# Patient Record
Sex: Female | Born: 1945 | Race: White | Hispanic: No | Marital: Married | Smoking: Former smoker
Health system: Southern US, Community
[De-identification: ages and names within clinical notes are randomized; demographics above are authoritative.]

## PROBLEM LIST (undated history)

## (undated) DIAGNOSIS — Z8719 Personal history of other diseases of the digestive system: Secondary | ICD-10-CM

## (undated) DIAGNOSIS — J189 Pneumonia, unspecified organism: Secondary | ICD-10-CM

## (undated) DIAGNOSIS — K219 Gastro-esophageal reflux disease without esophagitis: Secondary | ICD-10-CM

## (undated) DIAGNOSIS — Z9889 Other specified postprocedural states: Secondary | ICD-10-CM

## (undated) DIAGNOSIS — J439 Emphysema, unspecified: Secondary | ICD-10-CM

## (undated) DIAGNOSIS — K224 Dyskinesia of esophagus: Secondary | ICD-10-CM

## (undated) DIAGNOSIS — M1990 Unspecified osteoarthritis, unspecified site: Secondary | ICD-10-CM

## (undated) DIAGNOSIS — C4491 Basal cell carcinoma of skin, unspecified: Secondary | ICD-10-CM

## (undated) DIAGNOSIS — R112 Nausea with vomiting, unspecified: Secondary | ICD-10-CM

## (undated) HISTORY — PX: COLONOSCOPY: SHX174

---
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
## 1990-06-03 HISTORY — PX: TUBAL LIGATION: SHX77

## 1990-06-03 HISTORY — PX: LAPAROSCOPY: SHX197

## 2004-09-06 ENCOUNTER — Ambulatory Visit: Payer: Self-pay

## 2005-04-11 ENCOUNTER — Emergency Department: Payer: Self-pay | Admitting: Internal Medicine

## 2005-04-24 ENCOUNTER — Ambulatory Visit: Payer: Self-pay | Admitting: Gastroenterology

## 2005-05-17 ENCOUNTER — Ambulatory Visit: Payer: Self-pay | Admitting: Gastroenterology

## 2005-08-06 ENCOUNTER — Ambulatory Visit: Payer: Self-pay | Admitting: Unknown Physician Specialty

## 2007-06-04 HISTORY — PX: FRACTURE SURGERY: SHX138

## 2007-06-04 HISTORY — PX: SHOULDER OPEN ROTATOR CUFF REPAIR: SHX2407

## 2010-12-27 ENCOUNTER — Encounter (HOSPITAL_COMMUNITY): Payer: Self-pay | Admitting: *Deleted

## 2011-01-11 ENCOUNTER — Other Ambulatory Visit: Payer: Self-pay | Admitting: Obstetrics & Gynecology

## 2011-01-11 ENCOUNTER — Ambulatory Visit (HOSPITAL_COMMUNITY)
Admission: RE | Admit: 2011-01-11 | Discharge: 2011-01-11 | Disposition: A | Discharge status: Home / Self Care | Payer: BC Managed Care – PPO | Source: Ambulatory Visit | Attending: Obstetrics & Gynecology | Admitting: Obstetrics & Gynecology

## 2011-01-11 ENCOUNTER — Ambulatory Visit (HOSPITAL_COMMUNITY): Payer: BC Managed Care – PPO | Admitting: Anesthesiology

## 2011-01-11 ENCOUNTER — Encounter (HOSPITAL_COMMUNITY): Payer: Self-pay | Admitting: Anesthesiology

## 2011-01-11 ENCOUNTER — Encounter (HOSPITAL_COMMUNITY)
Admission: RE | Disposition: A | Discharge status: Home / Self Care | Payer: Self-pay | Source: Ambulatory Visit | Attending: Obstetrics & Gynecology

## 2011-01-11 DIAGNOSIS — N840 Polyp of corpus uteri: Secondary | ICD-10-CM | POA: Diagnosis present

## 2011-01-11 DIAGNOSIS — N950 Postmenopausal bleeding: Secondary | ICD-10-CM | POA: Insufficient documentation

## 2011-01-11 HISTORY — DX: Gastro-esophageal reflux disease without esophagitis: K21.9

## 2011-01-11 HISTORY — DX: Unspecified osteoarthritis, unspecified site: M19.90

## 2011-01-11 LAB — CBC
HCT: 43.6 % (ref 36.0–46.0)
Hemoglobin: 14.8 g/dL (ref 12.0–15.0)
MCH: 29.8 pg (ref 26.0–34.0)
MCHC: 33.9 g/dL (ref 30.0–36.0)
MCV: 87.9 fL (ref 78.0–100.0)
Platelets: 197 10*3/uL (ref 150–400)
RBC: 4.96 MIL/uL (ref 3.87–5.11)
RDW: 13.2 % (ref 11.5–15.5)
WBC: 7.2 10*3/uL (ref 4.0–10.5)

## 2011-01-11 SURGERY — DILATATION & CURETTAGE/HYSTEROSCOPY WITH RESECTOCOPE
Anesthesia: Choice

## 2011-01-11 MED ORDER — PROPOFOL 10 MG/ML IV EMUL
INTRAVENOUS | Status: DC | PRN
  Administered 2011-01-11: 50 mg via INTRAVENOUS
  Administered 2011-01-11: 150 mg via INTRAVENOUS

## 2011-01-11 MED ORDER — LIDOCAINE HCL 1 % IJ SOLN
INTRAMUSCULAR | Status: DC | PRN
  Administered 2011-01-11: 10 mL via INTRADERMAL

## 2011-01-11 MED ORDER — LIDOCAINE HCL (CARDIAC) 20 MG/ML IV SOLN
INTRAVENOUS | Status: DC | PRN
  Administered 2011-01-11: 80 mg via INTRAVENOUS

## 2011-01-11 MED ORDER — ONDANSETRON HCL 4 MG/2ML IJ SOLN
INTRAMUSCULAR | Status: DC | PRN
  Administered 2011-01-11: 4 mg via INTRAVENOUS

## 2011-01-11 MED ORDER — KETOROLAC TROMETHAMINE 30 MG/ML IJ SOLN
INTRAMUSCULAR | Status: DC | PRN
  Administered 2011-01-11: 30 mg via INTRAVENOUS

## 2011-01-11 MED ORDER — LACTATED RINGERS IV SOLN
INTRAVENOUS | Status: DC | PRN
  Administered 2011-01-11 (×2): via INTRAVENOUS

## 2011-01-11 MED ORDER — FENTANYL CITRATE 0.05 MG/ML IJ SOLN
INTRAMUSCULAR | Status: AC
  Administered 2011-01-11: 25 ug via INTRAVENOUS
  Filled 2011-01-11: qty 2

## 2011-01-11 MED ORDER — MIDAZOLAM HCL 5 MG/5ML IJ SOLN
INTRAMUSCULAR | Status: DC | PRN
  Administered 2011-01-11: 2 mg via INTRAVENOUS

## 2011-01-11 MED ORDER — GLYCINE 1.5 % IR SOLN
Status: DC | PRN
  Administered 2011-01-11: 3000 mL

## 2011-01-11 MED ORDER — FENTANYL CITRATE 0.05 MG/ML IJ SOLN
25.0000 ug | INTRAMUSCULAR | Status: DC | PRN
  Administered 2011-01-11: 25 ug via INTRAVENOUS

## 2011-01-11 MED ORDER — FENTANYL CITRATE 0.05 MG/ML IJ SOLN
INTRAMUSCULAR | Status: DC | PRN
  Administered 2011-01-11: 50 ug via INTRAVENOUS
  Administered 2011-01-11: 100 ug via INTRAVENOUS

## 2011-01-11 SURGICAL SUPPLY — 19 items
CANISTER SUCTION 2500CC (MISCELLANEOUS) ×2 IMPLANT
CATH ROBINSON RED A/P 16FR (CATHETERS) ×2 IMPLANT
CLOTH BEACON ORANGE TIMEOUT ST (SAFETY) ×2 IMPLANT
CONTAINER PREFILL 10% NBF 60ML (FORM) ×4 IMPLANT
DRAPE UTILITY XL STRL (DRAPES) ×4 IMPLANT
ELECT REM PT RETURN 9FT ADLT (ELECTROSURGICAL) ×2
ELECTRODE REM PT RTRN 9FT ADLT (ELECTROSURGICAL) ×1 IMPLANT
ELECTRODE ROLLER BARREL 22FR (ELECTROSURGICAL) IMPLANT
ELECTRODE ROLLER VERSAPOINT (ELECTRODE) IMPLANT
ELECTRODE RT ANGLE VERSAPOINT (CUTTING LOOP) IMPLANT
ELECTRODE VAPORCUT 22FR (ELECTROSURGICAL) IMPLANT
GLOVE BIO SURGEON STRL SZ7 (GLOVE) ×4 IMPLANT
GLOVE BIOGEL PI IND STRL 7.0 (GLOVE) ×1 IMPLANT
GLOVE BIOGEL PI INDICATOR 7.0 (GLOVE) ×1
GOWN PREVENTION PLUS LG XLONG (DISPOSABLE) ×4 IMPLANT
LOOP ANGLED CUTTING 22FR (CUTTING LOOP) ×2 IMPLANT
PACK HYSTEROSCOPY LF (CUSTOM PROCEDURE TRAY) ×2 IMPLANT
TOWEL OR 17X24 6PK STRL BLUE (TOWEL DISPOSABLE) ×4 IMPLANT
WATER STERILE IRR 1000ML POUR (IV SOLUTION) ×2 IMPLANT

## 2011-01-11 NOTE — Transfer of Care (Signed)
Immediate Anesthesia Transfer of Care Note  Patient: Beverly [name]  Procedure(s) Performed:  DILATATION & CURETTAGE/HYSTEROSCOPY WITH RESECTOSCOPE  Patient Location: PACU  Anesthesia Type: General  Level of Consciousness: awake, alert  and sedated  Airway & Oxygen Therapy: Patient Spontanous Breathing and Patient connected to nasal cannula oxygen  Post-op Assessment: Report given to PACU RN  Post vital signs: Reviewed and stable  Complications: No apparent anesthesia complications

## 2011-01-11 NOTE — Op Note (Signed)
Preoperative diagnosis: Endometrial polyp, postmenopausal bleeding Postoperative diagnosis: Same Procedure: Hysteroscopic polypectomy and D&C Surgeon Dr. Shea Evans, MD Anesthesia Gen. And paracervical block IV fluids LR 800 cc  Estimated blood loss minimal Irrigation fluid : glycine deficit 145 cc Urine clear preop Findings 3 large endometrial polyp noted., otherwise normal endometrial cavity, bilateral tubal ostia seen. Specimen- fragments of endometrial polyps Complications: None Condition stable to PACU.  Procedure: Patient is 65 year old woman with postmenopausal bleeding. Ultrasound noted endometrial polyp. Patient was counseled on hysteroscopic polypectomy. Risks and benefits of surgery including infection bleeding damage to internal organs reviewed. Informed written consent was obtained. Patient was brought to the operating room with IV running.  Patient underwent general anesthesia without complications. She was given dorsal lithotomy position and partial prepped and draped in standard fashion. Bladder was emptied with straight catheter. Cervix was exposed a speculum. 2 cc 1% lidocaine injected anterior cervical lip. Bilateral paracervical block given. Total 10 cc 1% lidocaine used. Uterus was sounded to 10 cm. Cervical os was dilated to #23 [place]. Resectoscope and glycine irrigation was introduced under visualization in the uterine cavity. A large endometrial polyps noted, 2 in the fundus and one in the lower segment. Otherwise endometrial cavity appeared normal with normal tubal ostia. All the endometrial polyps were resected under visualization with resectoscope without complications. Minimal bleeding noted. Hysteroscope was removed specimens were removed and sent to pathology. Once again hysteroscope was introduced in the uterine cavity hemostasis was excellent. Procedure was complete all instruments removed. All counts were correct x2. Patient was brought to PACU in stable  condition. Followup in office with me in 2 weeks.

## 2011-01-11 NOTE — H&P (Signed)
  Beverly [name] is an 65 y.o. female.  Postmenopausal bleeding, endometrial polyp, here for polypectomy.  Nl Paps in past. No STDs.  Nl mammograms.No LMP recorded. Patient is postmenopausal.    Past Medical History  Diagnosis Date  . GERD (gastroesophageal reflux disease)     occassional  . Arthritis     hips and knees    Past Surgical History  Procedure Date  . Tubal ligation 1992  . Fracture surgery 2009    left ankle  . Shoulder open rotator cuff repair 2009  . Laparoscopy 1992    infection after tubal    History reviewed. No pertinent family history.  Social History:  reports that she quit smoking about 31 years ago. She does not have any smokeless tobacco history on file. She reports that she drinks about 1.8 ounces of alcohol per week. She reports that she does not use illicit drugs.  Allergies:  Allergies  Allergen Reactions  . Macrodantin Hives    Prescriptions prior to admission  Medication Sig Dispense Refill  . Acetaminophen (TYLENOL ARTHRITIS PAIN PO) Take 2 tablets by mouth 2 (two) times daily. Patient only taking while the meloxicam is being held       . Estradiol (ELESTRIN) 0.52 MG/0.87 GM (0.06%) GEL Place 2 application onto the skin daily.        . meloxicam (MOBIC) 15 MG tablet Take 15 mg by mouth daily. This has been held for 10 days prior to procedure.       . Progesterone Micronized (PROMETRIUM PO) Take 1 capsule by mouth daily. Patient just started on this.  Did not know dose.  Called pharmacy and she has never gotten this filled.         @ROS @ SOB/chest pain/ HA/ vision changes/ LE pain or swelling/ etc.- negative.  Blood pressure 143/94, pulse 78, temperature 98.1 F (36.7 C), temperature source Oral, resp. rate 16, height 5\' 7"  (1.702 m), weight 65.772 kg (145 lb), SpO2 100.00%. Physical exam:  A&O x 3, no acute distress. Pleasant HEENT neg, no thyromegaly Lungs CTA bilat CV RRR, S1S2 normal Abdo soft, non tender, non acute Extr no edema/  tenderness Pelvic -deferred.  Results for orders placed during the hospital encounter of 01/11/11 (from the past 24 hour(s))  CBC     Status: Normal   Collection Time   01/11/11  9:45 AM      Component Value Range   WBC 7.2  4.0 - 10.5 (K/uL)   RBC 4.96  3.87 - 5.11 (MIL/uL)   Hemoglobin 14.8  12.0 - 15.0 (g/dL)   HCT 40.9  81.1 - 91.4 (%)   MCV 87.9  78.0 - 100.0 (fL)   MCH 29.8  26.0 - 34.0 (pg)   MCHC 33.9  30.0 - 36.0 (g/dL)   RDW 78.2  95.6 - 21.3 (%)   Platelets 197  150 - 400 (K/uL)    No results found.  Assessment/Plan: Endometril polyp, for H/scopic polypectomy/  Carsin Randazzo R 01/11/2011, 11:11 AM

## 2011-01-11 NOTE — Anesthesia Postprocedure Evaluation (Signed)
  Anesthesia Post-op Note  Patient: Beverly [name]  Procedure(s) Performed:  DILATATION & CURETTAGE/HYSTEROSCOPY WITH RESECTOSCOPE  Patient Location: PACU  Anesthesia Type: General  Level of Consciousness: awake, alert  and oriented  Airway and Oxygen Therapy: Patient Spontanous Breathing  Post-op Pain: none  Post-op Assessment: Post-op Vital signs reviewed, Patient's Cardiovascular Status Stable, Respiratory Function Stable, Patent Airway, No signs of Nausea or vomiting and Pain level controlled  Post-op Vital Signs: Reviewed and stable  Complications: No apparent anesthesia complications

## 2011-01-11 NOTE — Anesthesia Preprocedure Evaluation (Addendum)
Anesthesia Evaluation  Name, MR# and DOB Patient awake  General Assessment Comment  Reviewed: Allergy & Precautions, H&P , NPO status , Patient's Chart, lab work & pertinent test results  Airway Mallampati: II TM Distance: >3 FB     Dental No notable dental hx. (+) Teeth Intact   Pulmonary  clear to auscultation  pulmonary exam normalPulmonary Exam Normal breath sounds clear to auscultation none    Cardiovascular Regular Normal    Neuro/Psych Negative Neurological ROS  Negative Psych ROS  GI/Hepatic/Renal negative GI ROS, negative Liver ROS, and negative Renal ROS (+)       Endo/Other  Negative Endocrine ROS (+)      Abdominal Normal abdominal exam  (+)   Musculoskeletal  (+) Arthritis -, Osteoarthritis,    Hematology negative hematology ROS (+)   Peds  Reproductive/Obstetrics negative OB ROS    Anesthesia Other Findings             Anesthesia Physical Anesthesia Plan  ASA: II  Anesthesia Plan: General   Post-op Pain Management:    Induction:   Airway Management Planned: LMA  Additional Equipment:   Intra-op Plan:   Post-operative Plan:   Informed Consent:   Plan Discussed with:   Anesthesia Plan Comments:        Anesthesia Quick Evaluation

## 2011-01-25 NOTE — Addendum Note (Signed)
Addendum  created 01/25/11 0914 by Takeem Krotzer L. Reighlyn Elmes   Modules edited:Charting, Inpatient Notes

## 2011-02-19 ENCOUNTER — Other Ambulatory Visit: Payer: Self-pay | Admitting: Family Medicine

## 2011-02-19 DIAGNOSIS — Z1231 Encounter for screening mammogram for malignant neoplasm of breast: Secondary | ICD-10-CM

## 2011-03-05 ENCOUNTER — Ambulatory Visit
Admission: RE | Admit: 2011-03-05 | Discharge: 2011-03-05 | Disposition: A | Discharge status: Home / Self Care | Payer: Medicare Other | Source: Ambulatory Visit | Attending: Family Medicine | Admitting: Family Medicine

## 2011-03-05 DIAGNOSIS — Z1231 Encounter for screening mammogram for malignant neoplasm of breast: Secondary | ICD-10-CM

## 2011-03-20 ENCOUNTER — Other Ambulatory Visit: Payer: Self-pay | Admitting: Family Medicine

## 2011-03-20 DIAGNOSIS — R928 Other abnormal and inconclusive findings on diagnostic imaging of breast: Secondary | ICD-10-CM

## 2011-04-02 ENCOUNTER — Ambulatory Visit
Admission: RE | Admit: 2011-04-02 | Discharge: 2011-04-02 | Disposition: A | Discharge status: Home / Self Care | Payer: Medicare Other | Source: Ambulatory Visit | Attending: Family Medicine | Admitting: Family Medicine

## 2011-04-02 DIAGNOSIS — R928 Other abnormal and inconclusive findings on diagnostic imaging of breast: Secondary | ICD-10-CM

## 2011-06-28 ENCOUNTER — Ambulatory Visit (INDEPENDENT_AMBULATORY_CARE_PROVIDER_SITE_OTHER): Payer: Medicare Other

## 2011-06-28 DIAGNOSIS — N390 Urinary tract infection, site not specified: Secondary | ICD-10-CM

## 2011-06-28 DIAGNOSIS — R300 Dysuria: Secondary | ICD-10-CM | POA: Diagnosis not present

## 2011-07-03 DIAGNOSIS — N952 Postmenopausal atrophic vaginitis: Secondary | ICD-10-CM | POA: Diagnosis not present

## 2011-07-03 DIAGNOSIS — Z124 Encounter for screening for malignant neoplasm of cervix: Secondary | ICD-10-CM | POA: Diagnosis not present

## 2011-07-03 DIAGNOSIS — Z01419 Encounter for gynecological examination (general) (routine) without abnormal findings: Secondary | ICD-10-CM | POA: Diagnosis not present

## 2011-07-03 DIAGNOSIS — B373 Candidiasis of vulva and vagina: Secondary | ICD-10-CM | POA: Diagnosis not present

## 2011-07-22 DIAGNOSIS — R7989 Other specified abnormal findings of blood chemistry: Secondary | ICD-10-CM | POA: Diagnosis not present

## 2011-07-22 DIAGNOSIS — Z1329 Encounter for screening for other suspected endocrine disorder: Secondary | ICD-10-CM | POA: Diagnosis not present

## 2011-07-22 DIAGNOSIS — Z13220 Encounter for screening for lipoid disorders: Secondary | ICD-10-CM | POA: Diagnosis not present

## 2011-07-22 DIAGNOSIS — Z0000 Encounter for general adult medical examination without abnormal findings: Secondary | ICD-10-CM | POA: Diagnosis not present

## 2011-09-02 ENCOUNTER — Other Ambulatory Visit: Payer: Self-pay | Admitting: Family Medicine

## 2011-09-02 DIAGNOSIS — N63 Unspecified lump in unspecified breast: Secondary | ICD-10-CM

## 2011-09-06 ENCOUNTER — Ambulatory Visit
Admission: RE | Admit: 2011-09-06 | Discharge: 2011-09-06 | Disposition: A | Discharge status: Home / Self Care | Payer: BLUE CROSS/BLUE SHIELD | Source: Ambulatory Visit | Attending: Family Medicine | Admitting: Family Medicine

## 2011-09-06 DIAGNOSIS — N63 Unspecified lump in unspecified breast: Secondary | ICD-10-CM

## 2011-09-27 DIAGNOSIS — K6389 Other specified diseases of intestine: Secondary | ICD-10-CM | POA: Diagnosis not present

## 2011-09-27 DIAGNOSIS — K648 Other hemorrhoids: Secondary | ICD-10-CM | POA: Diagnosis not present

## 2011-09-27 DIAGNOSIS — Z09 Encounter for follow-up examination after completed treatment for conditions other than malignant neoplasm: Secondary | ICD-10-CM | POA: Diagnosis not present

## 2011-09-27 DIAGNOSIS — D126 Benign neoplasm of colon, unspecified: Secondary | ICD-10-CM | POA: Diagnosis not present

## 2011-09-27 DIAGNOSIS — Z86010 Personal history of colonic polyps: Secondary | ICD-10-CM | POA: Diagnosis not present

## 2012-02-20 DIAGNOSIS — D1800 Hemangioma unspecified site: Secondary | ICD-10-CM | POA: Diagnosis not present

## 2012-02-20 DIAGNOSIS — D239 Other benign neoplasm of skin, unspecified: Secondary | ICD-10-CM | POA: Diagnosis not present

## 2012-02-20 DIAGNOSIS — L820 Inflamed seborrheic keratosis: Secondary | ICD-10-CM | POA: Diagnosis not present

## 2012-02-20 DIAGNOSIS — L821 Other seborrheic keratosis: Secondary | ICD-10-CM | POA: Diagnosis not present

## 2012-02-20 DIAGNOSIS — L819 Disorder of pigmentation, unspecified: Secondary | ICD-10-CM | POA: Diagnosis not present

## 2012-03-19 ENCOUNTER — Other Ambulatory Visit: Payer: Self-pay | Admitting: Obstetrics & Gynecology

## 2012-03-19 DIAGNOSIS — N63 Unspecified lump in unspecified breast: Secondary | ICD-10-CM

## 2012-03-30 ENCOUNTER — Ambulatory Visit
Admission: RE | Admit: 2012-03-30 | Discharge: 2012-03-30 | Disposition: A | Discharge status: Home / Self Care | Payer: Medicare Other | Source: Ambulatory Visit | Attending: Obstetrics & Gynecology | Admitting: Obstetrics & Gynecology

## 2012-03-30 DIAGNOSIS — N63 Unspecified lump in unspecified breast: Secondary | ICD-10-CM

## 2012-03-30 DIAGNOSIS — R928 Other abnormal and inconclusive findings on diagnostic imaging of breast: Secondary | ICD-10-CM | POA: Diagnosis not present

## 2012-05-20 DIAGNOSIS — L02519 Cutaneous abscess of unspecified hand: Secondary | ICD-10-CM | POA: Diagnosis not present

## 2012-05-20 DIAGNOSIS — L03019 Cellulitis of unspecified finger: Secondary | ICD-10-CM | POA: Diagnosis not present

## 2012-06-02 DIAGNOSIS — L02519 Cutaneous abscess of unspecified hand: Secondary | ICD-10-CM | POA: Diagnosis not present

## 2012-06-02 DIAGNOSIS — L03019 Cellulitis of unspecified finger: Secondary | ICD-10-CM | POA: Diagnosis not present

## 2012-06-04 DIAGNOSIS — R3915 Urgency of urination: Secondary | ICD-10-CM | POA: Diagnosis not present

## 2012-06-15 DIAGNOSIS — N390 Urinary tract infection, site not specified: Secondary | ICD-10-CM | POA: Diagnosis not present

## 2012-06-23 DIAGNOSIS — L570 Actinic keratosis: Secondary | ICD-10-CM | POA: Diagnosis not present

## 2012-06-23 DIAGNOSIS — L821 Other seborrheic keratosis: Secondary | ICD-10-CM | POA: Diagnosis not present

## 2012-06-23 DIAGNOSIS — L219 Seborrheic dermatitis, unspecified: Secondary | ICD-10-CM | POA: Diagnosis not present

## 2012-06-23 DIAGNOSIS — L578 Other skin changes due to chronic exposure to nonionizing radiation: Secondary | ICD-10-CM | POA: Diagnosis not present

## 2012-06-23 DIAGNOSIS — L819 Disorder of pigmentation, unspecified: Secondary | ICD-10-CM | POA: Diagnosis not present

## 2012-07-14 DIAGNOSIS — Z01419 Encounter for gynecological examination (general) (routine) without abnormal findings: Secondary | ICD-10-CM | POA: Diagnosis not present

## 2012-07-14 DIAGNOSIS — Z124 Encounter for screening for malignant neoplasm of cervix: Secondary | ICD-10-CM | POA: Diagnosis not present

## 2012-07-29 DIAGNOSIS — Z130 Encounter for screening for diseases of the blood and blood-forming organs and certain disorders involving the immune mechanism: Secondary | ICD-10-CM | POA: Diagnosis not present

## 2012-07-29 DIAGNOSIS — Z131 Encounter for screening for diabetes mellitus: Secondary | ICD-10-CM | POA: Diagnosis not present

## 2012-07-29 DIAGNOSIS — E7800 Pure hypercholesterolemia, unspecified: Secondary | ICD-10-CM | POA: Diagnosis not present

## 2012-07-29 DIAGNOSIS — Z1329 Encounter for screening for other suspected endocrine disorder: Secondary | ICD-10-CM | POA: Diagnosis not present

## 2012-08-25 DIAGNOSIS — L578 Other skin changes due to chronic exposure to nonionizing radiation: Secondary | ICD-10-CM | POA: Diagnosis not present

## 2012-08-25 DIAGNOSIS — L219 Seborrheic dermatitis, unspecified: Secondary | ICD-10-CM | POA: Diagnosis not present

## 2012-08-25 DIAGNOSIS — L820 Inflamed seborrheic keratosis: Secondary | ICD-10-CM | POA: Diagnosis not present

## 2012-08-25 DIAGNOSIS — L570 Actinic keratosis: Secondary | ICD-10-CM | POA: Diagnosis not present

## 2012-08-25 DIAGNOSIS — L819 Disorder of pigmentation, unspecified: Secondary | ICD-10-CM | POA: Diagnosis not present

## 2012-10-29 ENCOUNTER — Emergency Department (INDEPENDENT_AMBULATORY_CARE_PROVIDER_SITE_OTHER)
Admission: EM | Admit: 2012-10-29 | Discharge: 2012-10-29 | Disposition: A | Discharge status: Home / Self Care | Payer: Medicare Other | Source: Home / Self Care | Attending: Emergency Medicine | Admitting: Emergency Medicine

## 2012-10-29 ENCOUNTER — Encounter (HOSPITAL_COMMUNITY): Payer: Self-pay | Admitting: *Deleted

## 2012-10-29 DIAGNOSIS — H9202 Otalgia, left ear: Secondary | ICD-10-CM

## 2012-10-29 DIAGNOSIS — H9209 Otalgia, unspecified ear: Secondary | ICD-10-CM

## 2012-10-29 MED ORDER — CIPROFLOXACIN-DEXAMETHASONE 0.3-0.1 % OT SUSP: 4.0000 [drp] | Freq: Two times a day (BID) | OTIC | Status: DC

## 2012-10-29 MED ORDER — AMOXICILLIN-POT CLAVULANATE 875-125 MG PO TABS: 1.0000 | ORAL_TABLET | Freq: Two times a day (BID) | ORAL | Status: DC

## 2012-10-29 MED ORDER — FEXOFENADINE-PSEUDOEPHED ER 60-120 MG PO TB12: 1.0000 | ORAL_TABLET | Freq: Two times a day (BID) | ORAL | Status: DC

## 2012-10-29 NOTE — ED Notes (Signed)
Pt  Reports  l   Earache          X  Several  Days        She   Reports  Is   Getting  Ready  To take  A  Trip  To  [place]  Soon          In  No  Acute  Distress

## 2012-10-29 NOTE — ED Provider Notes (Signed)
Chief Complaint:   Chief Complaint  Patient presents with  . 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 It feels somewhat congested. She denies any drainage. She has not had any nasal congestion, rhinorrhea, headache, facial pain, sore throat, adenopathy, or cough. She is leaving tomorrow for a trip to [place]. The pain is not worse with chewing or biting.  Review of Systems:  Other than noted above, the patient denies any of the following symptoms: Systemic:  No fevers, chills, sweats, weight loss or gain, fatigue, or tiredness. Eye:  No redness, pain, discharge, itching, blurred vision, or diplopia. ENT:  No headache, nasal congestion, sneezing, itching, epistaxis, ear pain, congestion, decreased hearing, ringing in ears, vertigo, or tinnitus.  No oral lesions, sore throat, pain on swallowing, or hoarseness. Neck:  No mass, tenderness or adenopathy. Lungs:  No coughing, wheezing, or shortness of breath. Skin:  No rash or itching.  PMFSH:  Past medical history, family history, social history, meds, and allergies were reviewed. She is allergic to Macrodantin.  Physical Exam:   Vital signs:  BP 132/82  Pulse 72  Temp(Src) 98.2 F (36.8 C) (Oral)  Resp 18  SpO2 100% General:  Alert and oriented.  In no distress.  Skin warm and dry. Eye:  PERRL, full EOMs, lids and conjunctiva normal.   ENT:  There was a small amount of cerumen in both ear canals. This was easily removed with ear curette. Thereafter the canals and TMs appeared normal with no evidence of inflammation, swelling, drainage, or air-fluid level.  Nasal mucosa not congested and without drainage.  Mucous membranes moist, no oral lesions, normal dentition, pharynx clear.  No cranial or facial pain to palplation. There is no pain to palpation of the TMJs in the TMJs both have a full range  of motion with no pain, clicking, or crepitus. Neck:  Supple, full ROM.  No adenopathy, tenderness or mass.  Thyroid normal. Lungs:  Breath sounds clear and equal bilaterally.  No wheezes, rales or rhonchi. Heart:  Rhythm regular, without extrasystoles.  No gallops or murmers. Skin:  Clear, warm and dry.  Assessment:  The encounter diagnosis was Otalgia, left.  I'm not sure what the cause of her earache is. The ear appears normal. Differential considerations include mild otitis externa, eustachian tube dysfunction, or TMJ arthritis. For right now I gave her Ciprodex ear drops and Allegra-D as a decongestant since she will be flying. I also gave her a prescription for Augmentin and told to get this filled and take this with her on the trip. The pain is worse or should not going she may start this.  Plan:   1.  The following meds were prescribed:   Discharge Medication List as of 10/29/2012  1:24 PM    START taking these medications   Details  amoxicillin-clavulanate (AUGMENTIN) 875-125 MG per tablet Take 1 tablet by mouth 2 (two) times daily., Starting 10/29/2012, Until Discontinued, Normal    ciprofloxacin-dexamethasone (CIPRODEX) otic suspension Place 4 drops into the left ear 2 (two) times daily., Starting 10/29/2012, Until Discontinued, Normal    fexofenadine-pseudoephedrine (ALLEGRA-D) 60-120 MG per tablet Take 1 tablet by mouth every 12 (twelve) hours., Starting 10/29/2012, Until Discontinued, Normal       2.  The patient was instructed in symptomatic care and handouts were given. 3.  The  patient was told to return if becoming worse in any way, if no better in 3 or 4 days, and given some red flag symptoms such as worsening pain or fever that would indicate earlier return. 4.  Follow up here or with an ear nose and throat specialist if not better upon her return from her trip.    Reuben Likes, MD 10/29/12 [phone]

## 2012-11-25 DIAGNOSIS — H2510 Age-related nuclear cataract, unspecified eye: Secondary | ICD-10-CM | POA: Diagnosis not present

## 2012-11-25 DIAGNOSIS — H43819 Vitreous degeneration, unspecified eye: Secondary | ICD-10-CM | POA: Diagnosis not present

## 2012-11-25 DIAGNOSIS — H524 Presbyopia: Secondary | ICD-10-CM | POA: Diagnosis not present

## 2012-11-25 DIAGNOSIS — H52229 Regular astigmatism, unspecified eye: Secondary | ICD-10-CM | POA: Diagnosis not present

## 2013-02-18 ENCOUNTER — Encounter: Payer: Self-pay | Admitting: Family Medicine

## 2013-02-18 ENCOUNTER — Ambulatory Visit (INDEPENDENT_AMBULATORY_CARE_PROVIDER_SITE_OTHER): Payer: Medicare Other | Admitting: Family Medicine

## 2013-02-18 VITALS — BP 118/78 | HR 70 | Temp 97.5°F | Resp 16 | Ht 67.0 in | Wt 128.8 lb

## 2013-02-18 DIAGNOSIS — R059 Cough, unspecified: Secondary | ICD-10-CM

## 2013-02-18 DIAGNOSIS — R05 Cough: Secondary | ICD-10-CM | POA: Diagnosis not present

## 2013-02-18 MED ORDER — PANTOPRAZOLE SODIUM 40 MG PO TBEC: 40.0000 mg | DELAYED_RELEASE_TABLET | Freq: Every day | ORAL | Status: DC

## 2013-02-18 NOTE — Progress Notes (Signed)
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  She only notices the cough when supine at night, and this would not be a problem but for the fact that she remarried 18 months ago and the cough is disturbing her partner.  Patient notes some rhinorrhea each morning until about noon.  She has a h/o esophageal motility dysfunction along with some GERD as determined by a workup in the past.  She took Protonix for awhile, but does not like taking meds as the long term side effects are unclear in many cases.  Patient has no fever or heartburn unless, on occasion she eats too much late at night.  No chest pains.  Objective:  NAD HEENT:  Mild nasal passage swelling with no significant mucopurulent d/c Chest:  Clear Heart:  Reg, no murmur Skin:  No rash  Assessment:  This may be reflux or post nasal drip from allergies.  Plan:  Check the bedroom for allergens (e.g. Plants, feather pillow) Take Protonix 40 qd x 14 days.  If cough disappears, we have an answer.  If cough persists, consider trial of Singulair or claritin and renew search for allergens in bedroom  Elvina Sidle, [place]

## 2013-02-18 NOTE — Patient Instructions (Addendum)

## 2013-02-22 ENCOUNTER — Other Ambulatory Visit: Payer: Self-pay

## 2013-02-22 DIAGNOSIS — Z1231 Encounter for screening mammogram for malignant neoplasm of breast: Secondary | ICD-10-CM

## 2013-02-24 ENCOUNTER — Telehealth: Payer: Self-pay

## 2013-02-24 ENCOUNTER — Ambulatory Visit (INDEPENDENT_AMBULATORY_CARE_PROVIDER_SITE_OTHER): Payer: Medicare Other | Admitting: Family Medicine

## 2013-02-24 VITALS — BP 128/78 | HR 86 | Temp 98.8°F | Resp 16 | Ht 67.5 in | Wt 131.0 lb

## 2013-02-24 DIAGNOSIS — N9489 Other specified conditions associated with female genital organs and menstrual cycle: Secondary | ICD-10-CM | POA: Diagnosis not present

## 2013-02-24 DIAGNOSIS — R52 Pain, unspecified: Secondary | ICD-10-CM

## 2013-02-24 DIAGNOSIS — R102 Pelvic and perineal pain: Secondary | ICD-10-CM

## 2013-02-24 DIAGNOSIS — N949 Unspecified condition associated with female genital organs and menstrual cycle: Secondary | ICD-10-CM

## 2013-02-24 LAB — POCT CBC
Granulocyte percent: 76.9 %G (ref 37–80)
HCT, POC: 42 % (ref 37.7–47.9)
Hemoglobin: 13.5 g/dL (ref 12.2–16.2)
Lymph, poc: 1.7 (ref 0.6–3.4)
MCH, POC: 29.9 pg (ref 27–31.2)
MCHC: 32.1 g/dL (ref 31.8–35.4)
MCV: 93 fL (ref 80–97)
MID (cbc): 0.5 (ref 0–0.9)
MPV: 8 fL (ref 0–99.8)
POC Granulocyte: 7.4 — AB (ref 2–6.9)
POC LYMPH PERCENT: 17.4 %L (ref 10–50)
POC MID %: 5.7 %M (ref 0–12)
Platelet Count, POC: 200 10*3/uL (ref 142–424)
RBC: 4.52 M/uL (ref 4.04–5.48)
RDW, POC: 14.5 %
WBC: 9.6 10*3/uL (ref 4.6–10.2)

## 2013-02-24 LAB — POCT WET PREP WITH KOH
Clue Cells Wet Prep HPF POC: NEGATIVE
KOH Prep POC: NEGATIVE
Trichomonas, UA: NEGATIVE
WBC Wet Prep HPF POC: NEGATIVE
Yeast Wet Prep HPF POC: NEGATIVE

## 2013-02-24 LAB — COMPREHENSIVE METABOLIC PANEL
ALT: 22 U/L (ref 0–35)
AST: 23 U/L (ref 0–37)
Albumin: 4.1 g/dL (ref 3.5–5.2)
Alkaline Phosphatase: 77 U/L (ref 39–117)
BUN: 12 mg/dL (ref 6–23)
CO2: 30 mEq/L (ref 19–32)
Calcium: 9.3 mg/dL (ref 8.4–10.5)
Chloride: 103 mEq/L (ref 96–112)
Creat: 0.74 mg/dL (ref 0.50–1.10)
Glucose, Bld: 96 mg/dL (ref 70–99)
Potassium: 4 mEq/L (ref 3.5–5.3)
Sodium: 141 mEq/L (ref 135–145)
Total Bilirubin: 0.4 mg/dL (ref 0.3–1.2)
Total Protein: 7.1 g/dL (ref 6.0–8.3)

## 2013-02-24 LAB — POCT URINALYSIS DIPSTICK
Bilirubin, UA: NEGATIVE
Blood, UA: NEGATIVE
Glucose, UA: NEGATIVE
Ketones, UA: NEGATIVE
Leukocytes, UA: NEGATIVE
Nitrite, UA: NEGATIVE
Protein, UA: NEGATIVE
Spec Grav, UA: 1.01
Urobilinogen, UA: 0.2
pH, UA: 6

## 2013-02-24 LAB — POCT UA - MICROSCOPIC ONLY
Bacteria, U Microscopic: NEGATIVE
Casts, Ur, LPF, POC: NEGATIVE
Crystals, Ur, HPF, POC: NEGATIVE
Mucus, UA: NEGATIVE
RBC, urine, microscopic: NEGATIVE
Yeast, UA: NEGATIVE

## 2013-02-24 LAB — IFOBT (OCCULT BLOOD): IFOBT: NEGATIVE

## 2013-02-24 MED ORDER — CEFTRIAXONE SODIUM 1 G IJ SOLR
1.0000 g | Freq: Once | INTRAMUSCULAR | Status: AC
  Administered 2013-02-24: 1 g via INTRAMUSCULAR

## 2013-02-24 NOTE — Telephone Encounter (Signed)
Agree with discontinuing medication.  RTC or ED if worsening pain

## 2013-02-24 NOTE — Progress Notes (Signed)
Subjective:    Patient ID: Beverly [name], female    DOB: 1945-09-13, 67 y.o.   MRN: [id number] Chief Complaint  Patient presents with  . Abdominal Pain    X saturday evening   HPI  She started protonix a weeks ago for a laying down cough at night.  Pain intermittent twinges on Sat night but has progressed and been constant for 2d now - since Mon. Feels best - low grade ache/burn if she is absolutely still but worse when she walks or when she has to urinate or have a BM with the act of straining makes it worse. Decreased appetite but no n/v. Urine and BM is completely normal. No vaginal d/c, is sexually active and feels like that would be painful now.  Stopped the protonix on Mon when the pain was getting worse as that was absolutely the only new thing going on.    Has been taking tylenol and ibuprofen for the past few days as she thought maybe she was having a reaction to the protonix.  She talked with a surgeon who recommended eval.  Thinks she has a low grade fever but has not felt feverish or sweats.  Other than BTL and following infection 20 yrs prev no abd surgeries.  Colonoscopy 07/2011 nml.  Did have uterine polyps removed 2 yrs ago - Dr. Juliene Pina gyn did pelvic exam about 6 mos ago.  Past Medical History  Diagnosis Date  . GERD (gastroesophageal reflux disease)     occassional  . Arthritis     hips and knees   Current Outpatient Prescriptions on File Prior to Visit  Medication Sig Dispense Refill  . Acetaminophen (TYLENOL ARTHRITIS PAIN PO) Take 2 tablets by mouth 2 (two) times daily. Patient only taking while the meloxicam is being held       . Estradiol (ELESTRIN) 0.52 MG/0.87 GM (0.06%) GEL Place 2 application onto the skin daily.        Marland Kitchen OVER THE COUNTER MEDICATION OTC Vitamin E taking      . OVER THE COUNTER MEDICATION OTC vitamin C taking      . OVER THE COUNTER MEDICATION OTC B 6 taking      . OVER THE COUNTER MEDICATION OTC B 12 taking      . OVER THE COUNTER MEDICATION OTC  Calcium+D taking      . OVER THE COUNTER MEDICATION OTC Omega 3 taking      . OVER THE COUNTER MEDICATION OTC fiber taking      . OVER THE COUNTER MEDICATION OTC Magnesium taking      . OVER THE COUNTER MEDICATION OTC Cranberry taking      . OVER THE COUNTER MEDICATION OTC Lysine taking      . Progesterone Micronized (PROMETRIUM PO) Take 1 capsule by mouth daily. Patient just started on this.  Did not know dose.  Called pharmacy and she has never gotten this filled.        No current facility-administered medications on file prior to visit.   Allergies  Allergen Reactions  . Macrodantin Hives   Past Surgical History  Procedure Laterality Date  . Tubal ligation  1992  . Fracture surgery  2009    left ankle  . Shoulder open rotator cuff repair  2009  . Laparoscopy  1992    infection after tubal - Left falopian tube abscess    Review of Systems  Constitutional: Positive for activity change and appetite change. Negative for fever, chills, fatigue  and unexpected weight change.  Respiratory: Negative for shortness of breath.   Cardiovascular: Negative for chest pain and leg swelling.  Gastrointestinal: Positive for abdominal pain. Negative for nausea, vomiting, diarrhea, constipation, blood in stool, abdominal distention, anal bleeding and rectal pain.  Genitourinary: Negative for dysuria, decreased urine volume and difficulty urinating.  Musculoskeletal: Negative for gait problem.  Skin: Negative for rash.  Hematological: Negative for adenopathy.  Psychiatric/Behavioral: Negative for sleep disturbance.      BP 128/78  Pulse 86  Temp(Src) 98.8 F (37.1 C) (Oral)  Resp 16  Ht 5' 7.5" (1.715 m)  Wt 131 lb (59.421 kg)  BMI 20.2 kg/m2  SpO2 99% Objective:   Physical Exam  Constitutional: She is oriented to person, place, and time. She appears well-developed and well-nourished. No distress.  HENT:  Head: Normocephalic and atraumatic.  Neck: Normal range of motion. Neck supple.  No thyromegaly present.  Cardiovascular: Normal rate, regular rhythm, normal heart sounds and intact distal pulses.   Pulmonary/Chest: Effort normal and breath sounds normal. No respiratory distress.  Abdominal: Soft. Normal appearance and bowel sounds are normal. There is no hepatosplenomegaly. There is tenderness in the right lower quadrant, suprapubic area and left lower quadrant. There is no rigidity, no rebound, no guarding and no CVA tenderness. No hernia.  Genitourinary: Rectal exam shows external hemorrhoid. Rectal exam shows no fissure, no mass, no tenderness and anal tone normal. There is no rash, tenderness, lesion or injury on the right labia. There is no rash, tenderness, lesion or injury on the left labia. Uterus is tender. Uterus is not deviated, not enlarged and not fixed. Cervix exhibits motion tenderness and discharge. Cervix exhibits no friability. Right adnexum displays no mass, no tenderness and no fullness. Left adnexum displays no mass, no tenderness and no fullness. No erythema, tenderness or bleeding around the vagina. No foreign body around the vagina. No signs of injury around the vagina. Vaginal discharge found.  Moderate amount of thick white creamy vaginal d/c.  Musculoskeletal: She exhibits no edema.  Lymphadenopathy:    She has no cervical adenopathy.  Neurological: She is alert and oriented to person, place, and time.  Skin: Skin is warm and dry. She is not diaphoretic. No erythema.  Psychiatric: She has a normal mood and affect. Her behavior is normal.      Results for orders placed in visit on 02/24/13  POCT CBC      Result Value Range   WBC 9.6  4.6 - 10.2 K/uL   Lymph, poc 1.7  0.6 - 3.4   POC LYMPH PERCENT 17.4  10 - 50 %L   MID (cbc) 0.5  0 - 0.9   POC MID % 5.7  0 - 12 %M   POC Granulocyte 7.4 (*) 2 - 6.9   Granulocyte percent 76.9  37 - 80 %G   RBC 4.52  4.04 - 5.48 M/uL   Hemoglobin 13.5  12.2 - 16.2 g/dL   HCT, POC 02.7  25.3 - 47.9 %   MCV 93.0   80 - 97 fL   MCH, POC 29.9  27 - 31.2 pg   MCHC 32.1  31.8 - 35.4 g/dL   RDW, POC 66.4     Platelet Count, POC 200  142 - 424 K/uL   MPV 8.0  0 - 99.8 fL  IFOBT (OCCULT BLOOD)      Result Value Range   IFOBT Negative    POCT UA - MICROSCOPIC ONLY  Result Value Range   WBC, Ur, HPF, POC 0-1     RBC, urine, microscopic neg     Bacteria, U Microscopic neg     Mucus, UA neg     Epithelial cells, urine per micros 0-2     Crystals, Ur, HPF, POC neg     Casts, Ur, LPF, POC neg     Yeast, UA neg    POCT URINALYSIS DIPSTICK      Result Value Range   Color, UA yellow     Clarity, UA clear     Glucose, UA neg     Bilirubin, UA neg     Ketones, UA neg     Spec Grav, UA 1.010     Blood, UA neg     pH, UA 6.0     Protein, UA neg     Urobilinogen, UA 0.2     Nitrite, UA neg     Leukocytes, UA Negative    POCT WET PREP WITH KOH      Result Value Range   Trichomonas, UA Negative     Clue Cells Wet Prep HPF POC neg     Epithelial Wet Prep HPF POC tntc     Yeast Wet Prep HPF POC neg     Bacteria Wet Prep HPF POC 2+     RBC Wet Prep HPF POC 0-1     WBC Wet Prep HPF POC neg     KOH Prep POC Negative     Assessment & Plan:  Pelvic pain in female - Plan: POCT CBC, IFOBT POC (occult bld, rslt in office), POCT UA - Microscopic Only, POCT urinalysis dipstick, POCT Wet Prep with KOH, Comprehensive metabolic panel, GC/Chlamydia Probe Amp, US Pelvis Complete, US Transvaginal Non-OB, cefTRIAXone (ROCEPHIN) injection 1 g, CANCELED: GC/chlamydia probe amp, genital  Other specified disorders of female genital organs  - Plan: GC/Chlamydia Probe Amp Uterine tenderness and + CMT so treated with 1g Rocephin IM x 1 in office and TVUS/pelvic US tomorrow - then follow-up here or with gynecologist Dr. Juliene Pina for futher eval and treatment. CMP and GC/CT probe pending.

## 2013-02-24 NOTE — Telephone Encounter (Signed)
Spoke to patient, she d/c the medication last pm. Advised her it is very rare to have abdominal pain from this medication. I have encouraged her to come in, she prefers to wait another day to see if she gets better after discontinuing the medication. She agrees to come in tomorrow if the pain continues. She also agrees to go to the ER if the pain becomes severe. To you FYI

## 2013-02-24 NOTE — Telephone Encounter (Signed)
Pt of Dr.L. Is experiencing abdominal pain. The medicine prescribed says that is a common side effect. Would like to discuss this and see what she should do.  [id number].

## 2013-02-24 NOTE — Patient Instructions (Addendum)
We will arrange for you to get a STAT pelvic and transvaginal ultrasound tomorrow at [facility] on [facility].  We should contact you by 9 a.m. With this appointment.  After you have your ultrasound, please follow-up here or with Dr. Juliene Pina to discuss the results and further diagnosis and treatment of your pain.  Call Dr. Camillia Herter office first thing in the morning to get an appointment in late afternoon Thurs or early Friday.  If they cannot work you in, I will be happy to see you back here on Thursday.  Go to the ER if your pain worsens.

## 2013-02-24 NOTE — Telephone Encounter (Signed)
Dr Jamey Ripa has called for patient, he works with her and is very concerned. He has advised her to come in, she is coming now.

## 2013-02-25 ENCOUNTER — Ambulatory Visit
Admission: RE | Admit: 2013-02-25 | Discharge: 2013-02-25 | Disposition: A | Discharge status: Home / Self Care | Payer: Medicare Other | Source: Ambulatory Visit | Attending: Family Medicine | Admitting: Family Medicine

## 2013-02-25 DIAGNOSIS — N83209 Unspecified ovarian cyst, unspecified side: Secondary | ICD-10-CM | POA: Diagnosis not present

## 2013-02-25 LAB — GC/CHLAMYDIA PROBE AMP
CT Probe RNA: NEGATIVE
GC Probe RNA: NEGATIVE

## 2013-02-26 DIAGNOSIS — N719 Inflammatory disease of uterus, unspecified: Secondary | ICD-10-CM | POA: Diagnosis not present

## 2013-03-31 ENCOUNTER — Ambulatory Visit
Admission: RE | Admit: 2013-03-31 | Discharge: 2013-03-31 | Disposition: A | Discharge status: Home / Self Care | Payer: Medicare Other | Source: Ambulatory Visit

## 2013-03-31 DIAGNOSIS — Z1231 Encounter for screening mammogram for malignant neoplasm of breast: Secondary | ICD-10-CM | POA: Diagnosis not present

## 2013-04-08 ENCOUNTER — Other Ambulatory Visit: Payer: Self-pay

## 2013-07-12 ENCOUNTER — Other Ambulatory Visit: Payer: Self-pay | Admitting: Family Medicine

## 2013-07-30 DIAGNOSIS — Z124 Encounter for screening for malignant neoplasm of cervix: Secondary | ICD-10-CM | POA: Diagnosis not present

## 2013-07-30 DIAGNOSIS — Z01419 Encounter for gynecological examination (general) (routine) without abnormal findings: Secondary | ICD-10-CM | POA: Diagnosis not present

## 2013-08-03 DIAGNOSIS — L821 Other seborrheic keratosis: Secondary | ICD-10-CM | POA: Diagnosis not present

## 2013-08-03 DIAGNOSIS — L578 Other skin changes due to chronic exposure to nonionizing radiation: Secondary | ICD-10-CM | POA: Diagnosis not present

## 2013-08-03 DIAGNOSIS — L259 Unspecified contact dermatitis, unspecified cause: Secondary | ICD-10-CM | POA: Diagnosis not present

## 2013-08-03 DIAGNOSIS — L608 Other nail disorders: Secondary | ICD-10-CM | POA: Diagnosis not present

## 2013-08-03 DIAGNOSIS — D485 Neoplasm of uncertain behavior of skin: Secondary | ICD-10-CM | POA: Diagnosis not present

## 2013-08-03 DIAGNOSIS — L820 Inflamed seborrheic keratosis: Secondary | ICD-10-CM | POA: Diagnosis not present

## 2013-08-03 DIAGNOSIS — D239 Other benign neoplasm of skin, unspecified: Secondary | ICD-10-CM | POA: Diagnosis not present

## 2013-08-09 DIAGNOSIS — Z130 Encounter for screening for diseases of the blood and blood-forming organs and certain disorders involving the immune mechanism: Secondary | ICD-10-CM | POA: Diagnosis not present

## 2013-08-09 DIAGNOSIS — Z1329 Encounter for screening for other suspected endocrine disorder: Secondary | ICD-10-CM | POA: Diagnosis not present

## 2013-08-09 DIAGNOSIS — Z0000 Encounter for general adult medical examination without abnormal findings: Secondary | ICD-10-CM | POA: Diagnosis not present

## 2013-08-09 DIAGNOSIS — Z13220 Encounter for screening for lipoid disorders: Secondary | ICD-10-CM | POA: Diagnosis not present

## 2013-08-16 ENCOUNTER — Telehealth: Payer: Self-pay | Admitting: Physician Assistant

## 2013-08-16 NOTE — Telephone Encounter (Signed)
Spoke to patient, she is aware there are abnormalities and wants to make an appointment with Dr L. Instead of walking in. The first available is in April, I suggested she come to the walk in clinic sooner, but she felt as though there wasn't anything pressing enough to come in sooner.

## 2013-08-16 NOTE — Telephone Encounter (Signed)
Please call pt.  Received faxed lab work from [facility].  It is unclear from the note whether they discussed any of her labs or treated her.  Due to several abnormalities - cholesterol, thyroid, sodium, potassium - recommend she come in to discuss or see her PCP

## 2013-09-09 ENCOUNTER — Other Ambulatory Visit: Payer: Self-pay | Admitting: Family Medicine

## 2013-10-07 ENCOUNTER — Ambulatory Visit: Payer: Medicare Other | Admitting: Family Medicine

## 2013-10-11 ENCOUNTER — Other Ambulatory Visit: Payer: Self-pay | Admitting: Physician Assistant

## 2013-10-18 ENCOUNTER — Ambulatory Visit (INDEPENDENT_AMBULATORY_CARE_PROVIDER_SITE_OTHER): Payer: Medicare Other | Admitting: Family Medicine

## 2013-10-18 ENCOUNTER — Encounter: Payer: Self-pay | Admitting: Family Medicine

## 2013-10-18 VITALS — BP 118/82 | HR 65 | Temp 98.1°F | Resp 16 | Ht 67.5 in | Wt 130.8 lb

## 2013-10-18 DIAGNOSIS — L989 Disorder of the skin and subcutaneous tissue, unspecified: Secondary | ICD-10-CM

## 2013-10-18 DIAGNOSIS — R7989 Other specified abnormal findings of blood chemistry: Secondary | ICD-10-CM

## 2013-10-18 DIAGNOSIS — K219 Gastro-esophageal reflux disease without esophagitis: Secondary | ICD-10-CM | POA: Diagnosis not present

## 2013-10-18 DIAGNOSIS — R946 Abnormal results of thyroid function studies: Secondary | ICD-10-CM | POA: Diagnosis not present

## 2013-10-18 LAB — TSH: TSH: 2.996 u[IU]/mL (ref 0.350–4.500)

## 2013-10-18 MED ORDER — PANTOPRAZOLE SODIUM 40 MG PO TBEC: 40.0000 mg | DELAYED_RELEASE_TABLET | Freq: Every day | ORAL | Status: DC

## 2013-10-18 NOTE — Patient Instructions (Signed)
Please see dermatologist for left shin lesion

## 2013-10-18 NOTE — Progress Notes (Signed)
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 She's here for several problems: 1 patient had an elevated TSH in March which was 8 point something when she was seen by Dr. Benjie Karvonen during her annual GYN exam 2: Patient has persistent mild cough which is getting better on Protonix but still persists some extent. This cough occurs when she's supine. It's nonproductive. 3: Patient has a small lesion on her left shin along with several small itchy lesions on her left forearm. Provisional reduction has been going on for several months and the one on her forearm her just recent. 4: Elevated cholesterol, patient reluctant to start on statins.  Patient walks up to 10 miles a day with her husband and is very active.  Objective: Articulate well-educated woman in no distress HEENT: Grossly unremarkable Chest: Clear Heart: Regular no murmur. Skin: 2 small slightly indurated faintly erythematous for 5 mm papules on left forearm One 6 mm erythematous well circumscribed area on the left shin which has a lighter pink central annular feature.  Assessment: I think that the mild cough the patient has is not a significant problem in pursuing it is not in her best interest. She agrees and will continue the Protonix. Elevated TSH needs further evaluation Not sure what the small lesion on the left shin is slightly she should go back to her dermatologist. She's had psoriasis in the past but this does not look psoriatic but rather more like an actinic keratosis.  I do not think that the cholesterol needs to be treated with her level of activity and lack of other risk factors.  GERD (gastroesophageal reflux disease)  Elevated TSH - Plan: TSH  Skin lesion  Signed, Robyn Haber, MD

## 2013-11-03 DIAGNOSIS — D229 Melanocytic nevi, unspecified: Secondary | ICD-10-CM

## 2013-11-03 DIAGNOSIS — D485 Neoplasm of uncertain behavior of skin: Secondary | ICD-10-CM | POA: Diagnosis not present

## 2013-11-03 DIAGNOSIS — D2370 Other benign neoplasm of skin of unspecified lower limb, including hip: Secondary | ICD-10-CM | POA: Diagnosis not present

## 2013-11-03 HISTORY — DX: Melanocytic nevi, unspecified: D22.9

## 2013-11-10 DIAGNOSIS — L820 Inflamed seborrheic keratosis: Secondary | ICD-10-CM | POA: Diagnosis not present

## 2013-11-10 DIAGNOSIS — N950 Postmenopausal bleeding: Secondary | ICD-10-CM | POA: Diagnosis not present

## 2013-11-20 ENCOUNTER — Other Ambulatory Visit: Payer: Self-pay | Admitting: Physician Assistant

## 2014-01-03 DIAGNOSIS — R3915 Urgency of urination: Secondary | ICD-10-CM | POA: Diagnosis not present

## 2014-02-28 ENCOUNTER — Other Ambulatory Visit: Payer: Self-pay

## 2014-02-28 DIAGNOSIS — Z1231 Encounter for screening mammogram for malignant neoplasm of breast: Secondary | ICD-10-CM

## 2014-03-07 DIAGNOSIS — H1013 Acute atopic conjunctivitis, bilateral: Secondary | ICD-10-CM | POA: Diagnosis not present

## 2014-04-04 ENCOUNTER — Ambulatory Visit
Admission: RE | Admit: 2014-04-04 | Discharge: 2014-04-04 | Disposition: A | Discharge status: Home / Self Care | Payer: Medicare Other | Source: Ambulatory Visit

## 2014-04-04 DIAGNOSIS — Z1231 Encounter for screening mammogram for malignant neoplasm of breast: Secondary | ICD-10-CM

## 2014-08-01 DIAGNOSIS — Z01419 Encounter for gynecological examination (general) (routine) without abnormal findings: Secondary | ICD-10-CM | POA: Diagnosis not present

## 2014-08-01 DIAGNOSIS — Z124 Encounter for screening for malignant neoplasm of cervix: Secondary | ICD-10-CM | POA: Diagnosis not present

## 2014-08-03 DIAGNOSIS — Z1283 Encounter for screening for malignant neoplasm of skin: Secondary | ICD-10-CM | POA: Diagnosis not present

## 2014-08-03 DIAGNOSIS — D229 Melanocytic nevi, unspecified: Secondary | ICD-10-CM | POA: Diagnosis not present

## 2014-08-03 DIAGNOSIS — L820 Inflamed seborrheic keratosis: Secondary | ICD-10-CM | POA: Diagnosis not present

## 2014-08-03 DIAGNOSIS — D1800 Hemangioma unspecified site: Secondary | ICD-10-CM | POA: Diagnosis not present

## 2014-08-03 DIAGNOSIS — L814 Other melanin hyperpigmentation: Secondary | ICD-10-CM | POA: Diagnosis not present

## 2014-08-03 DIAGNOSIS — D485 Neoplasm of uncertain behavior of skin: Secondary | ICD-10-CM | POA: Diagnosis not present

## 2014-08-03 DIAGNOSIS — L578 Other skin changes due to chronic exposure to nonionizing radiation: Secondary | ICD-10-CM | POA: Diagnosis not present

## 2014-08-03 DIAGNOSIS — L821 Other seborrheic keratosis: Secondary | ICD-10-CM | POA: Diagnosis not present

## 2014-09-06 DIAGNOSIS — N644 Mastodynia: Secondary | ICD-10-CM | POA: Diagnosis not present

## 2014-09-12 ENCOUNTER — Other Ambulatory Visit: Payer: Self-pay | Admitting: Obstetrics & Gynecology

## 2014-09-12 DIAGNOSIS — N644 Mastodynia: Secondary | ICD-10-CM

## 2014-09-14 ENCOUNTER — Ambulatory Visit
Admission: RE | Admit: 2014-09-14 | Discharge: 2014-09-14 | Disposition: A | Discharge status: Home / Self Care | Payer: Medicare Other | Source: Ambulatory Visit | Attending: Obstetrics & Gynecology | Admitting: Obstetrics & Gynecology

## 2014-09-14 DIAGNOSIS — N644 Mastodynia: Secondary | ICD-10-CM

## 2014-09-17 DIAGNOSIS — H0015 Chalazion left lower eyelid: Secondary | ICD-10-CM | POA: Diagnosis not present

## 2014-10-17 DIAGNOSIS — R05 Cough: Secondary | ICD-10-CM | POA: Diagnosis not present

## 2014-10-17 DIAGNOSIS — R232 Flushing: Secondary | ICD-10-CM | POA: Diagnosis not present

## 2014-10-17 DIAGNOSIS — I10 Essential (primary) hypertension: Secondary | ICD-10-CM | POA: Diagnosis not present

## 2014-10-17 DIAGNOSIS — K219 Gastro-esophageal reflux disease without esophagitis: Secondary | ICD-10-CM | POA: Diagnosis not present

## 2014-10-26 DIAGNOSIS — Z79890 Hormone replacement therapy (postmenopausal): Secondary | ICD-10-CM | POA: Diagnosis not present

## 2014-10-26 DIAGNOSIS — N950 Postmenopausal bleeding: Secondary | ICD-10-CM | POA: Diagnosis not present

## 2014-11-16 ENCOUNTER — Other Ambulatory Visit: Payer: Self-pay | Admitting: Family Medicine

## 2015-03-15 ENCOUNTER — Other Ambulatory Visit: Payer: Self-pay

## 2015-03-15 DIAGNOSIS — Z1231 Encounter for screening mammogram for malignant neoplasm of breast: Secondary | ICD-10-CM

## 2015-04-13 ENCOUNTER — Ambulatory Visit
Admission: RE | Admit: 2015-04-13 | Discharge: 2015-04-13 | Disposition: A | Discharge status: Home / Self Care | Payer: Medicare Other | Source: Ambulatory Visit

## 2015-04-13 DIAGNOSIS — Z1231 Encounter for screening mammogram for malignant neoplasm of breast: Secondary | ICD-10-CM

## 2015-06-26 DIAGNOSIS — H00019 Hordeolum externum unspecified eye, unspecified eyelid: Secondary | ICD-10-CM | POA: Diagnosis not present

## 2015-06-30 ENCOUNTER — Ambulatory Visit
Admission: RE | Admit: 2015-06-30 | Discharge: 2015-06-30 | Disposition: A | Discharge status: Home / Self Care | Payer: Medicare Other | Source: Ambulatory Visit | Attending: Family Medicine | Admitting: Family Medicine

## 2015-06-30 ENCOUNTER — Other Ambulatory Visit: Payer: Self-pay | Admitting: Family Medicine

## 2015-06-30 DIAGNOSIS — R030 Elevated blood-pressure reading, without diagnosis of hypertension: Secondary | ICD-10-CM | POA: Diagnosis not present

## 2015-06-30 DIAGNOSIS — R05 Cough: Secondary | ICD-10-CM

## 2015-06-30 DIAGNOSIS — R053 Chronic cough: Secondary | ICD-10-CM

## 2015-06-30 DIAGNOSIS — K224 Dyskinesia of esophagus: Secondary | ICD-10-CM | POA: Diagnosis not present

## 2015-06-30 DIAGNOSIS — K219 Gastro-esophageal reflux disease without esophagitis: Secondary | ICD-10-CM | POA: Diagnosis not present

## 2015-06-30 DIAGNOSIS — H00019 Hordeolum externum unspecified eye, unspecified eyelid: Secondary | ICD-10-CM | POA: Diagnosis not present

## 2015-07-04 ENCOUNTER — Encounter: Payer: Self-pay | Admitting: Allergy and Immunology

## 2015-07-04 ENCOUNTER — Ambulatory Visit (INDEPENDENT_AMBULATORY_CARE_PROVIDER_SITE_OTHER): Payer: Medicare Other | Admitting: Allergy and Immunology

## 2015-07-04 VITALS — BP 120/75 | HR 70 | Resp 17 | Ht 67.32 in | Wt 129.9 lb

## 2015-07-04 DIAGNOSIS — R05 Cough: Secondary | ICD-10-CM

## 2015-07-04 DIAGNOSIS — R059 Cough, unspecified: Secondary | ICD-10-CM

## 2015-07-04 DIAGNOSIS — J310 Chronic rhinitis: Secondary | ICD-10-CM

## 2015-07-04 MED ORDER — AZELASTINE HCL 0.15 % NA SOLN: NASAL | Status: DC

## 2015-07-04 MED ORDER — FLUTICASONE PROPIONATE 50 MCG/ACT NA SUSP: NASAL | Status: DC

## 2015-07-04 NOTE — Assessment & Plan Note (Addendum)
Beverly [name]'s history and physical examination suggest upper airway cough syndrome.  Reflux may also be contributing.  Treatment plan as outlined above.  She may add guaifenesin [phone] mg twice daily as needed with adequate hydration or thick postnasal drainage.   For now, continue pantoprazole as previously prescribed.  If the coughing persists or progresses despite this plan, we will evaluate further.

## 2015-07-04 NOTE — Patient Instructions (Addendum)
Chronic rhinitis Non-allergic rhinitis.  All seasonal and perennial aeroallergen skin tests are negative despite a positive histamine control.  Intranasal steroids and intranasal antihistamines are effective for symptoms associated with non-allergic rhinitis, whereas second generation antihistamines such as cetirizine, loratadine and fexofenadine have been found to be ineffective for this condition.  A prescription has been provided for azelastine nasal spray, one spray per nostril 1-2 times daily as needed. Proper nasal spray technique has been discussed and demonstrated.  If needed, she may add fluticasone nasal spray, 1-2 sprays per nostril daily. A prescription has been provided.  I have also recommended nasal saline spray (i.e. Simply Saline) as needed prior to medicated nasal sprays.  Cough Kellene's history and physical examination suggest upper airway cough syndrome.  Reflux may also be contributing.  Treatment plan as outlined above.  She may add guaifenesin [phone] mg twice daily as needed with adequate hydration or thick postnasal drainage.   For now, continue pantoprazole as previously prescribed.  If the coughing persists or progresses despite this plan, we will evaluate further.    Return in about 4 months (around 11/01/2015), or if symptoms worsen or fail to improve.

## 2015-07-04 NOTE — Assessment & Plan Note (Signed)
Non-allergic rhinitis.  All seasonal and perennial aeroallergen skin tests are negative despite a positive histamine control.  Intranasal steroids and intranasal antihistamines are effective for symptoms associated with non-allergic rhinitis, whereas second generation antihistamines such as cetirizine, loratadine and fexofenadine have been found to be ineffective for this condition.  A prescription has been provided for azelastine nasal spray, one spray per nostril 1-2 times daily as needed. Proper nasal spray technique has been discussed and demonstrated.  If needed, she may add fluticasone nasal spray, 1-2 sprays per nostril daily. A prescription has been provided.  I have also recommended nasal saline spray (i.e. Simply Saline) as needed prior to medicated nasal sprays.

## 2015-07-04 NOTE — Progress Notes (Signed)
New Patient Note  RE: Beverly [name] MRN: [id number] DOB: 07-20-1945 Date of Office Visit: 07/04/2015  Referring provider: Antony Contras, MD Primary care provider: Gara Kroner, MD  Chief Complaint: Allergic Rhinitis    History of present illness: HPI Comments: Beverly [name] is a 70 y.o. female who presents today for consultation of rhinitis. She reports rhinorrhea "every day for years."  The rhinorrhea is worse in the morning hours and when she is outdoors, particularly in cold weather. She complains of occasional sinus pressure which tends to occur with rapid weather changes.  She notes occasional postnasal drainage and a hacking cough, particularly when she is in the prone position.  She takes a proton pump inhibitor for esophageal dysmotility and denies reflux related symptoms.   Assessment and plan: Chronic rhinitis Non-allergic rhinitis.  All seasonal and perennial aeroallergen skin tests are negative despite a positive histamine control.  Intranasal steroids and intranasal antihistamines are effective for symptoms associated with non-allergic rhinitis, whereas second generation antihistamines such as cetirizine, loratadine and fexofenadine have been found to be ineffective for this condition.  A prescription has been provided for azelastine nasal spray, one spray per nostril 1-2 times daily as needed. Proper nasal spray technique has been discussed and demonstrated.  If needed, she may add fluticasone nasal spray, 1-2 sprays per nostril daily. A prescription has been provided.  I have also recommended nasal saline spray (i.e. Simply Saline) as needed prior to medicated nasal sprays.  Cough [name]'s history and physical examination suggest upper airway cough syndrome.  Reflux may also be contributing.  Treatment plan as outlined above.  She may add guaifenesin [phone] mg twice daily as needed with adequate hydration or thick postnasal drainage.   For now, continue pantoprazole  as previously prescribed.  If the coughing persists or progresses despite this plan, we will evaluate further.    Meds ordered this encounter  Medications  . fluticasone (FLONASE) 50 MCG/ACT nasal spray    Sig: Use 1-2 sprays per nostril daily as needed.    Dispense:  16 g    Refill:  5  . Azelastine HCl 0.15 % SOLN    Sig: Use one spray  each nostril 1-2 times daily as needed.    Dispense:  30 mL    Refill:  5    Diagnositics: Allergy skin testing: Negative despite a positive histamine control.    Physical examination: Blood pressure 120/75, pulse 70, resp. rate 17, height 5' 7.32" (1.71 m), weight 129 lb 13.6 oz (58.9 kg).  General: Alert, interactive, in no acute distress. HEENT: TMs pearly gray, turbinates moderately edematous with clear discharge, post-pharynx moderately erythematous. Neck: Supple without lymphadenopathy. Lungs: Clear to auscultation without wheezing, rhonchi or rales. CV: Normal S1, S2 without murmurs. Abdomen: Nondistended, nontender. Skin: Warm and dry, without lesions or rashes. Extremities:  No clubbing, cyanosis or edema. Neuro:   Grossly intact.  Review of systems: Review of Systems  Constitutional: Negative for fever, chills and weight loss.  HENT: Positive for congestion. Negative for nosebleeds.   Eyes: Negative for blurred vision.  Respiratory: Negative for hemoptysis, shortness of breath and wheezing.   Cardiovascular: Negative for chest pain.  Gastrointestinal: Negative for diarrhea and constipation.  Genitourinary: Negative for dysuria.  Musculoskeletal: Negative for myalgias and joint pain.  Skin: Negative for itching and rash.  Neurological: Positive for headaches. Negative for dizziness.  Endo/Heme/Allergies: Does not bruise/bleed easily.    Past medical history: Past Medical History  Diagnosis Date  . GERD (  gastroesophageal reflux disease)     occassional  . Arthritis     hips and knees    Past surgical history: Past  Surgical History  Procedure Laterality Date  . Tubal ligation  1992  . Fracture surgery  2009    left ankle  . Shoulder open rotator cuff repair  2009  . Laparoscopy  1992    infection after tubal    Family history: Family History  Problem Relation Age of Onset  . Allergic rhinitis Neg Hx   . Angioedema Neg Hx   . Asthma Neg Hx   . Atopy Neg Hx   . Eczema Neg Hx   . Immunodeficiency Neg Hx   . Urticaria Neg Hx     Social history: Social History   Social History  . Marital Status: Single    Spouse Name: N/A  . Number of Children: N/A  . Years of Education: N/A   Occupational History  . Not on file.   Social History Main Topics  . Smoking status: Former Smoker    Quit date: 12/27/1979  . Smokeless tobacco: Not on file  . Alcohol Use: 1.8 oz/week    3 Glasses of wine per week  . Drug Use: No  . Sexual Activity: No   Other Topics Concern  . Not on file   Social History Narrative   Environmental History: The patient lives in a 70 year old townhouse with hardwood floors throughout and central air/heat.  There are no pets in the townhouse.  She is a former cigarette smoker and is not exposed to secondhand cigarette smoke.    Medication List       This list is accurate as of: 07/04/15 11:07 AM.  Always use your most recent med list.               Azelastine HCl 0.15 % Soln  Use one spray  each nostril 1-2 times daily as needed.     ELESTRIN 0.52 MG/0.87 GM (0.06%) Gel  Generic drug:  Estradiol  Place 2 application onto the skin daily.     fluticasone 50 MCG/ACT nasal spray  Commonly known as:  FLONASE  Use 1-2 sprays per nostril daily as needed.     OVER THE COUNTER MEDICATION  OTC Vitamin E taking     OVER THE COUNTER MEDICATION  OTC vitamin C taking     OVER THE COUNTER MEDICATION  OTC B 6 taking     OVER THE COUNTER MEDICATION  OTC B 12 taking     OVER THE COUNTER MEDICATION  OTC Calcium+D taking     OVER THE COUNTER MEDICATION  OTC  Omega 3 taking     OVER THE COUNTER MEDICATION  OTC fiber taking     OVER THE COUNTER MEDICATION  OTC Magnesium taking     OVER THE COUNTER MEDICATION  OTC Cranberry taking     OVER THE COUNTER MEDICATION  OTC Lysine taking     pantoprazole 40 MG tablet  Commonly known as:  PROTONIX  TAKE 1 TABLET BY MOUTH DAILY.  'OV NEEDED FOR ADDITIONAL REFILLS"     PREMARIN vaginal cream  Generic drug:  conjugated estrogens     PROMETRIUM PO  Take 1 capsule by mouth daily. Patient just started on this.  Did not know dose.  Called pharmacy and she has never gotten this filled.     TYLENOL ARTHRITIS PAIN PO  Take 2 tablets by mouth 2 (two) times daily. Patient only taking  while the meloxicam is being held        Known medication allergies: Allergies  Allergen Reactions  . Macrodantin Hives  . Sulfa Antibiotics Palpitations    I appreciate the opportunity to take part in this Ota's care. Please do not hesitate to contact me with questions.  Sincerely,   R. Edgar Frisk, MD

## 2015-07-10 ENCOUNTER — Encounter: Payer: Self-pay | Admitting: *Deleted

## 2015-07-21 DIAGNOSIS — R05 Cough: Secondary | ICD-10-CM | POA: Diagnosis not present

## 2015-07-21 DIAGNOSIS — H00019 Hordeolum externum unspecified eye, unspecified eyelid: Secondary | ICD-10-CM | POA: Diagnosis not present

## 2015-07-21 DIAGNOSIS — J3489 Other specified disorders of nose and nasal sinuses: Secondary | ICD-10-CM | POA: Diagnosis not present

## 2015-07-21 DIAGNOSIS — K219 Gastro-esophageal reflux disease without esophagitis: Secondary | ICD-10-CM | POA: Diagnosis not present

## 2015-07-24 DIAGNOSIS — H0012 Chalazion right lower eyelid: Secondary | ICD-10-CM | POA: Diagnosis not present

## 2015-08-07 DIAGNOSIS — Z124 Encounter for screening for malignant neoplasm of cervix: Secondary | ICD-10-CM | POA: Diagnosis not present

## 2015-08-07 DIAGNOSIS — Z01419 Encounter for gynecological examination (general) (routine) without abnormal findings: Secondary | ICD-10-CM | POA: Diagnosis not present

## 2015-08-10 DIAGNOSIS — L308 Other specified dermatitis: Secondary | ICD-10-CM | POA: Diagnosis not present

## 2015-08-10 DIAGNOSIS — L812 Freckles: Secondary | ICD-10-CM | POA: Diagnosis not present

## 2015-08-10 DIAGNOSIS — L219 Seborrheic dermatitis, unspecified: Secondary | ICD-10-CM | POA: Diagnosis not present

## 2015-08-10 DIAGNOSIS — L578 Other skin changes due to chronic exposure to nonionizing radiation: Secondary | ICD-10-CM | POA: Diagnosis not present

## 2015-08-10 DIAGNOSIS — D485 Neoplasm of uncertain behavior of skin: Secondary | ICD-10-CM | POA: Diagnosis not present

## 2015-08-10 DIAGNOSIS — D229 Melanocytic nevi, unspecified: Secondary | ICD-10-CM | POA: Diagnosis not present

## 2015-08-10 DIAGNOSIS — D1800 Hemangioma unspecified site: Secondary | ICD-10-CM | POA: Diagnosis not present

## 2015-08-10 DIAGNOSIS — L821 Other seborrheic keratosis: Secondary | ICD-10-CM | POA: Diagnosis not present

## 2015-08-10 DIAGNOSIS — L820 Inflamed seborrheic keratosis: Secondary | ICD-10-CM | POA: Diagnosis not present

## 2015-08-10 DIAGNOSIS — Z1283 Encounter for screening for malignant neoplasm of skin: Secondary | ICD-10-CM | POA: Diagnosis not present

## 2015-10-27 DIAGNOSIS — Z23 Encounter for immunization: Secondary | ICD-10-CM | POA: Diagnosis not present

## 2016-02-08 ENCOUNTER — Other Ambulatory Visit: Payer: Self-pay

## 2016-03-26 ENCOUNTER — Other Ambulatory Visit: Payer: Self-pay | Admitting: Obstetrics & Gynecology

## 2016-03-26 DIAGNOSIS — Z1231 Encounter for screening mammogram for malignant neoplasm of breast: Secondary | ICD-10-CM

## 2016-04-10 DIAGNOSIS — L820 Inflamed seborrheic keratosis: Secondary | ICD-10-CM | POA: Diagnosis not present

## 2016-04-10 DIAGNOSIS — L821 Other seborrheic keratosis: Secondary | ICD-10-CM | POA: Diagnosis not present

## 2016-04-10 DIAGNOSIS — Z23 Encounter for immunization: Secondary | ICD-10-CM | POA: Diagnosis not present

## 2016-04-10 DIAGNOSIS — L570 Actinic keratosis: Secondary | ICD-10-CM | POA: Diagnosis not present

## 2016-04-16 ENCOUNTER — Ambulatory Visit
Admission: RE | Admit: 2016-04-16 | Discharge: 2016-04-16 | Disposition: A | Discharge status: Home / Self Care | Payer: Medicare Other | Source: Ambulatory Visit | Attending: Obstetrics & Gynecology | Admitting: Obstetrics & Gynecology

## 2016-04-16 DIAGNOSIS — Z1231 Encounter for screening mammogram for malignant neoplasm of breast: Secondary | ICD-10-CM | POA: Diagnosis not present

## 2016-08-08 DIAGNOSIS — J040 Acute laryngitis: Secondary | ICD-10-CM | POA: Diagnosis not present

## 2016-08-08 DIAGNOSIS — R062 Wheezing: Secondary | ICD-10-CM | POA: Diagnosis not present

## 2016-08-16 DIAGNOSIS — M791 Myalgia: Secondary | ICD-10-CM | POA: Diagnosis not present

## 2016-08-16 DIAGNOSIS — R05 Cough: Secondary | ICD-10-CM | POA: Diagnosis not present

## 2016-08-16 DIAGNOSIS — R509 Fever, unspecified: Secondary | ICD-10-CM | POA: Diagnosis not present

## 2016-08-23 DIAGNOSIS — J069 Acute upper respiratory infection, unspecified: Secondary | ICD-10-CM | POA: Diagnosis not present

## 2016-09-02 ENCOUNTER — Other Ambulatory Visit: Payer: Self-pay | Admitting: Family Medicine

## 2016-09-02 ENCOUNTER — Ambulatory Visit
Admission: RE | Admit: 2016-09-02 | Discharge: 2016-09-02 | Disposition: A | Discharge status: Home / Self Care | Payer: Medicare Other | Source: Ambulatory Visit | Attending: Family Medicine | Admitting: Family Medicine

## 2016-09-02 DIAGNOSIS — R053 Chronic cough: Secondary | ICD-10-CM

## 2016-09-02 DIAGNOSIS — L309 Dermatitis, unspecified: Secondary | ICD-10-CM | POA: Diagnosis not present

## 2016-09-02 DIAGNOSIS — R05 Cough: Secondary | ICD-10-CM

## 2016-09-02 DIAGNOSIS — R0981 Nasal congestion: Secondary | ICD-10-CM | POA: Diagnosis not present

## 2016-09-10 DIAGNOSIS — D485 Neoplasm of uncertain behavior of skin: Secondary | ICD-10-CM | POA: Diagnosis not present

## 2016-09-10 DIAGNOSIS — R21 Rash and other nonspecific skin eruption: Secondary | ICD-10-CM | POA: Diagnosis not present

## 2016-09-10 DIAGNOSIS — Z1283 Encounter for screening for malignant neoplasm of skin: Secondary | ICD-10-CM | POA: Diagnosis not present

## 2016-09-10 DIAGNOSIS — L813 Cafe au lait spots: Secondary | ICD-10-CM | POA: Diagnosis not present

## 2016-09-10 DIAGNOSIS — L821 Other seborrheic keratosis: Secondary | ICD-10-CM | POA: Diagnosis not present

## 2016-09-10 DIAGNOSIS — D1800 Hemangioma unspecified site: Secondary | ICD-10-CM | POA: Diagnosis not present

## 2016-09-10 DIAGNOSIS — D229 Melanocytic nevi, unspecified: Secondary | ICD-10-CM | POA: Diagnosis not present

## 2016-09-13 DIAGNOSIS — R490 Dysphonia: Secondary | ICD-10-CM | POA: Diagnosis not present

## 2016-09-18 DIAGNOSIS — Z79890 Hormone replacement therapy (postmenopausal): Secondary | ICD-10-CM | POA: Diagnosis not present

## 2016-09-18 DIAGNOSIS — N952 Postmenopausal atrophic vaginitis: Secondary | ICD-10-CM | POA: Diagnosis not present

## 2016-09-18 DIAGNOSIS — Z124 Encounter for screening for malignant neoplasm of cervix: Secondary | ICD-10-CM | POA: Diagnosis not present

## 2016-09-20 ENCOUNTER — Other Ambulatory Visit: Payer: Self-pay | Admitting: Obstetrics & Gynecology

## 2016-09-20 DIAGNOSIS — H1013 Acute atopic conjunctivitis, bilateral: Secondary | ICD-10-CM | POA: Diagnosis not present

## 2016-09-20 DIAGNOSIS — E2839 Other primary ovarian failure: Secondary | ICD-10-CM

## 2016-10-03 ENCOUNTER — Ambulatory Visit
Admission: RE | Admit: 2016-10-03 | Discharge: 2016-10-03 | Disposition: A | Discharge status: Home / Self Care | Payer: Medicare Other | Source: Ambulatory Visit | Attending: Obstetrics & Gynecology | Admitting: Obstetrics & Gynecology

## 2016-10-03 DIAGNOSIS — Z780 Asymptomatic menopausal state: Secondary | ICD-10-CM | POA: Diagnosis not present

## 2016-10-03 DIAGNOSIS — E2839 Other primary ovarian failure: Secondary | ICD-10-CM

## 2016-10-03 DIAGNOSIS — Z13820 Encounter for screening for osteoporosis: Secondary | ICD-10-CM | POA: Diagnosis not present

## 2016-10-22 DIAGNOSIS — D126 Benign neoplasm of colon, unspecified: Secondary | ICD-10-CM | POA: Diagnosis not present

## 2016-10-22 DIAGNOSIS — K6389 Other specified diseases of intestine: Secondary | ICD-10-CM | POA: Diagnosis not present

## 2016-10-22 DIAGNOSIS — K5730 Diverticulosis of large intestine without perforation or abscess without bleeding: Secondary | ICD-10-CM | POA: Diagnosis not present

## 2016-10-22 DIAGNOSIS — Z86010 Personal history of colonic polyps: Secondary | ICD-10-CM | POA: Diagnosis not present

## 2016-10-22 DIAGNOSIS — K640 First degree hemorrhoids: Secondary | ICD-10-CM | POA: Diagnosis not present

## 2016-10-25 ENCOUNTER — Ambulatory Visit (INDEPENDENT_AMBULATORY_CARE_PROVIDER_SITE_OTHER): Payer: Medicare Other | Admitting: Emergency Medicine

## 2016-10-25 ENCOUNTER — Encounter: Payer: Self-pay | Admitting: Emergency Medicine

## 2016-10-25 VITALS — BP 155/84 | HR 63 | Resp 18 | Ht 67.17 in | Wt 126.0 lb

## 2016-10-25 DIAGNOSIS — H6121 Impacted cerumen, right ear: Secondary | ICD-10-CM

## 2016-10-25 NOTE — Progress Notes (Signed)
Beverly [name] 71 y.o.   Chief Complaint  Patient presents with  . impacted cereum    R ear    HISTORY OF PRESENT ILLNESS: This is a 71 y.o. female complaining of impacted right ear, unable to hear for several days.  HPI   Prior to Admission medications   Medication Sig Start Date End Date Taking? Authorizing Provider  Acetaminophen (TYLENOL ARTHRITIS PAIN PO) Take 2 tablets by mouth 2 (two) times daily. Patient only taking while the meloxicam is being held    Yes [provider]  Azelastine HCl 0.15 % SOLN Use one spray  each nostril 1-2 times daily as needed. 07/04/15  Yes Bobbitt, Sedalia Muta, MD  Estradiol (ELESTRIN) 0.52 MG/0.87 GM (0.06%) GEL Place 2 application onto the skin daily.     Yes [provider]  fluticasone (FLONASE) 50 MCG/ACT nasal spray Use 1-2 sprays per nostril daily as needed. 07/04/15  Yes Bobbitt, Sedalia Muta, MD  OVER THE COUNTER MEDICATION OTC vitamin C taking   Yes [provider]  OVER THE COUNTER MEDICATION OTC B 6 taking   Yes [provider]  OVER THE COUNTER MEDICATION OTC B 12 taking   Yes [provider]  OVER THE COUNTER MEDICATION OTC Calcium+D taking   Yes [provider]  OVER THE COUNTER MEDICATION OTC fiber taking   Yes [provider]  OVER THE COUNTER MEDICATION OTC Cranberry taking   Yes [provider]  OVER THE COUNTER MEDICATION OTC Lysine taking   Yes [provider]  pantoprazole (PROTONIX) 40 MG tablet TAKE 1 TABLET BY MOUTH DAILY.  'OV NEEDED FOR ADDITIONAL REFILLS" 11/16/14  Yes Weber, Damaris Hippo, PA-C  PREMARIN vaginal cream  02/16/13  Yes [provider]  Progesterone Micronized (PROMETRIUM PO) Take 1 capsule by mouth daily. Patient just started on this.  Did not know dose.  Called pharmacy and Beverly [name] has never gotten this filled.    Yes [provider]    Allergies  Allergen Reactions  . Macrodantin Hives  . Sulfa Antibiotics  Palpitations    Patient Active Problem List   Diagnosis Date Noted  . Chronic rhinitis 07/04/2015  . Cough 07/04/2015  . Endometrial polyp 01/11/2011    Past Medical History:  Diagnosis Date  . Arthritis    hips and knees  . GERD (gastroesophageal reflux disease)    occassional    Past Surgical History:  Procedure Laterality Date  . FRACTURE SURGERY  2009   left ankle  . LAPAROSCOPY  1992   infection after tubal  . SHOULDER OPEN ROTATOR CUFF REPAIR  2009  . TUBAL LIGATION  1992    Social History   Social History  . Marital status: Married    Spouse name: N/A  . Number of children: N/A  . Years of education: N/A   Occupational History  . Not on file.   Social History Main Topics  . Smoking status: Former Smoker    Quit date: 12/27/1979  . Smokeless tobacco: Never Used  . Alcohol use 1.8 oz/week    3 Glasses of wine per week  . Drug use: No  . Sexual activity: No   Other Topics Concern  . Not on file   Social History Narrative  . No narrative on file    Family History  Problem Relation Age of Onset  . Allergic rhinitis Neg Hx   . Angioedema Neg Hx   . Asthma Neg Hx   . Atopy Neg  Hx   . Eczema Neg Hx   . Immunodeficiency Neg Hx   . Urticaria Neg Hx      Review of Systems  Constitutional: Negative for chills and fever.  HENT: Positive for hearing loss.   Respiratory: Negative for cough.   Gastrointestinal: Negative for nausea and vomiting.  Skin: Negative for rash.  Neurological: Negative for dizziness and headaches.  All other systems reviewed and are negative.  Vitals:   10/25/16 1013  BP: (!) 155/84  Pulse: 63  Resp: 18     Physical Exam  Constitutional: Beverly [name] is oriented to person, place, and time. Beverly [name] appears well-developed and well-nourished.  HENT:  Head: Normocephalic and atraumatic.  Right ear completely impacted with wax; left ear partially obstructed with wax.  Eyes: EOM are normal. Pupils are equal, round, and reactive to  light.  Neck: Normal range of motion. Neck supple.  Cardiovascular: Normal rate.   Pulmonary/Chest: Effort normal.  Neurological: Beverly [name] is alert and oriented to person, place, and time.  Skin: Skin is warm and dry. Capillary refill takes less than 2 seconds.  Psychiatric: Beverly [name] has a normal mood and affect. Her behavior is normal.  Vitals reviewed.    ASSESSMENT & PLAN: Beverly [name] was seen today for impacted cereum.  Diagnoses and all orders for this visit:  Hearing loss of right ear due to cerumen impaction -     Ear Lavage    Patient Instructions       IF you received an x-ray today, you will receive an invoice from [facility]. Please contact [facility] at [phone] with questions or concerns regarding your invoice.   IF you received labwork today, you will receive an invoice from [place]. Please contact [facility] at [phone] with questions or concerns regarding your invoice.   Our billing staff will not be able to assist you with questions regarding bills from these companies.  You will be contacted with the lab results as soon as they are available. The fastest way to get your results is to activate your My Chart account. Instructions are located on the last page of this paperwork. If you have not heard from [place] regarding the results in 2 weeks, please contact this office.      Earwax Buildup Your ears make a substance called earwax. It may also be called cerumen. Sometimes, too much earwax builds up in your ear canal. This can cause ear pain and make it harder for you to hear. CAUSES This condition is caused by too much earwax production or buildup. RISK FACTORS The following factors may make you more likely to develop this condition:  Cleaning your ears often with swabs.  Having narrow ear canals.  Having earwax that is overly thick or sticky.  Having eczema.  Being dehydrated. SYMPTOMS Symptoms of this condition include:  Reduced  hearing.  Ear drainage.  Ear pain.  Ear itch.  A feeling of fullness in the ear or feeling that the ear is plugged.  Ringing in the ear.  Coughing. DIAGNOSIS Your health care provider can diagnose this condition based on your symptoms and medical history. Your health care provider will also do an ear exam to look inside your ear with a scope (otoscope). You may also have a hearing test. TREATMENT Treatment for this condition includes:  Over-the-counter or prescription ear drops to soften the earwax.  Earwax removal by a health care provider. This may be done:  By flushing the ear with body-temperature water.  With a medical  instrument that has a loop at the end (earwax curette).  With a suction device. HOME CARE INSTRUCTIONS  Take over-the-counter and prescription medicines only as told by your health care provider.  Do not put any objects, including an ear swab, into your ear. You can clean the opening of your ear canal with a washcloth.  Drink enough water to keep your urine clear or pale yellow.  If you have frequent earwax buildup or you use hearing aids, consider seeing your health care provider every 6-12 months for routine preventive ear cleanings. Keep all follow-up visits as told by your health care provider. SEEK MEDICAL CARE IF:  You have ear pain.  Your condition does not improve with treatment.  You have hearing loss.  You have blood, pus, or other fluid coming from your ear. This information is not intended to replace advice given to you by your health care provider. Make sure you discuss any questions you have with your health care provider. Document Released: 06/27/2004 Document Revised: 09/11/2015 Document Reviewed: 01/04/2015 Elsevier Interactive Patient Education  2017 Elsevier Inc.      Agustina Caroli, MD [facility]

## 2016-10-25 NOTE — Patient Instructions (Addendum)
     IF you received an x-ray today, you will receive an invoice from [facility]. Please contact [facility] at [phone] with questions or concerns regarding your invoice.   IF you received labwork today, you will receive an invoice from [facility]. Please contact [facility] at [phone] with questions or concerns regarding your invoice.   Our billing staff will not be able to assist you with questions regarding bills from these companies.  You will be contacted with the lab results as soon as they are available. The fastest way to get your results is to activate your My Chart account. Instructions are located on the last page of this paperwork. If you have not heard from us regarding the results in 2 weeks, please contact this office.     Earwax Buildup Your ears make a substance called earwax. It may also be called cerumen. Sometimes, too much earwax builds up in your ear canal. This can cause ear pain and make it harder for you to hear. CAUSES This condition is caused by too much earwax production or buildup. RISK FACTORS The following factors may make you more likely to develop this condition:  Cleaning your ears often with swabs.  Having narrow ear canals.  Having earwax that is overly thick or sticky.  Having eczema.  Being dehydrated. SYMPTOMS Symptoms of this condition include:  Reduced hearing.  Ear drainage.  Ear pain.  Ear itch.  A feeling of fullness in the ear or feeling that the ear is plugged.  Ringing in the ear.  Coughing. DIAGNOSIS Your health care provider can diagnose this condition based on your symptoms and medical history. Your health care provider will also do an ear exam to look inside your ear with a scope (otoscope). You may also have a hearing test. TREATMENT Treatment for this condition includes:  Over-the-counter or prescription ear drops to soften the earwax.  Earwax removal by a health care provider. This may be  done:  By flushing the ear with body-temperature water.  With a medical instrument that has a loop at the end (earwax curette).  With a suction device. HOME CARE INSTRUCTIONS  Take over-the-counter and prescription medicines only as told by your health care provider.  Do not put any objects, including an ear swab, into your ear. You can clean the opening of your ear canal with a washcloth.  Drink enough water to keep your urine clear or pale yellow.  If you have frequent earwax buildup or you use hearing aids, consider seeing your health care provider every 6-12 months for routine preventive ear cleanings. Keep all follow-up visits as told by your health care provider. SEEK MEDICAL CARE IF:  You have ear pain.  Your condition does not improve with treatment.  You have hearing loss.  You have blood, pus, or other fluid coming from your ear. This information is not intended to replace advice given to you by your health care provider. Make sure you discuss any questions you have with your health care provider. Document Released: 06/27/2004 Document Revised: 09/11/2015 Document Reviewed: 01/04/2015 Elsevier Interactive Patient Education  2017 Elsevier Inc.   

## 2017-01-10 DIAGNOSIS — H6123 Impacted cerumen, bilateral: Secondary | ICD-10-CM | POA: Diagnosis not present

## 2017-01-10 DIAGNOSIS — R1310 Dysphagia, unspecified: Secondary | ICD-10-CM | POA: Diagnosis not present

## 2017-01-10 DIAGNOSIS — Z1389 Encounter for screening for other disorder: Secondary | ICD-10-CM | POA: Diagnosis not present

## 2017-01-10 DIAGNOSIS — Z23 Encounter for immunization: Secondary | ICD-10-CM | POA: Diagnosis not present

## 2017-03-17 ENCOUNTER — Other Ambulatory Visit: Payer: Self-pay | Admitting: Obstetrics & Gynecology

## 2017-03-17 DIAGNOSIS — Z1231 Encounter for screening mammogram for malignant neoplasm of breast: Secondary | ICD-10-CM

## 2017-04-02 DIAGNOSIS — Z23 Encounter for immunization: Secondary | ICD-10-CM | POA: Diagnosis not present

## 2017-04-21 ENCOUNTER — Ambulatory Visit
Admission: RE | Admit: 2017-04-21 | Discharge: 2017-04-21 | Disposition: A | Discharge status: Home / Self Care | Payer: Medicare Other | Source: Ambulatory Visit | Attending: Obstetrics & Gynecology | Admitting: Obstetrics & Gynecology

## 2017-04-21 DIAGNOSIS — Z1231 Encounter for screening mammogram for malignant neoplasm of breast: Secondary | ICD-10-CM | POA: Diagnosis not present

## 2017-04-23 ENCOUNTER — Ambulatory Visit (INDEPENDENT_AMBULATORY_CARE_PROVIDER_SITE_OTHER): Payer: Medicare Other | Admitting: Orthopaedic Surgery

## 2017-04-23 ENCOUNTER — Encounter (INDEPENDENT_AMBULATORY_CARE_PROVIDER_SITE_OTHER): Payer: Self-pay | Admitting: Orthopaedic Surgery

## 2017-04-23 ENCOUNTER — Ambulatory Visit (INDEPENDENT_AMBULATORY_CARE_PROVIDER_SITE_OTHER): Payer: Medicare Other

## 2017-04-23 VITALS — Ht 67.0 in | Wt 126.0 lb

## 2017-04-23 DIAGNOSIS — M25551 Pain in right hip: Secondary | ICD-10-CM | POA: Diagnosis not present

## 2017-04-23 DIAGNOSIS — M7061 Trochanteric bursitis, right hip: Secondary | ICD-10-CM | POA: Diagnosis not present

## 2017-04-23 MED ORDER — LIDOCAINE HCL 1 % IJ SOLN
3.0000 mL | INTRAMUSCULAR | Status: AC | PRN
  Administered 2017-04-23: 3 mL

## 2017-04-23 MED ORDER — METHYLPREDNISOLONE ACETATE 40 MG/ML IJ SUSP
40.0000 mg | INTRAMUSCULAR | Status: AC | PRN
  Administered 2017-04-23: 40 mg via INTRA_ARTICULAR

## 2017-04-23 NOTE — Progress Notes (Signed)
Office Visit Note   Patient: Beverly [name]           Date of Birth: [birth year]           MRN: [id number] Visit Date: 04/23/2017              Requested by: Antony Contras, MD [place], [place] [address] PCP: Antony Contras, MD   Assessment & Plan: Visit Diagnoses:  1. Pain in right hip   2. Trochanteric bursitis, right hip     Plan: I do feel there is a small component of arthritis about her hip but her pain seems to be more trochanteric related.  I talked her about a steroid injection in this area and she is agreeable to this. The rationale and the risk behind injections as well as the benefits.  I also showed her stretching exercises to try.  She will avoid horse riding for now and her hiking she should keep on flat surfaces in the interim.  I will see her back in 4 weeks to see how she is doing overall.  Follow-Up Instructions: Return in about 4 weeks (around 05/21/2017).   Orders:  Orders Placed This Encounter  Procedures  . Large Joint Inj  . XR HIP UNILAT W OR W/O PELVIS 2-3 VIEWS RIGHT   No orders of the defined types were placed in this encounter.     Procedures: Large Joint Inj: R greater trochanter on 04/23/2017 3:44 PM Indications: pain and diagnostic evaluation Details: 22 G 1.5 in needle, lateral approach  Arthrogram: No  Medications: 3 mL lidocaine 1 %; 40 mg methylPREDNISolone acetate 40 MG/ML Outcome: tolerated well, no immediate complications Procedure, treatment alternatives, risks and benefits explained, specific risks discussed. Consent was given by the patient. Immediately prior to procedure a time out was called to verify the correct patient, procedure, equipment, support staff and site/side marked as required. Patient was prepped and draped in the usual sterile fashion.       Clinical Data: No additional findings.   Subjective: Chief Complaint  Patient presents with  . Right Hip - Pain   Patient is a 71 year old  pleasant female with right hip pain is been worsening for years now.  Last 2 months started to hurt quite a bit after riding horses.  She walks and hikes about 25 miles per week and is affecting her as well.  She is getting some pain all around her right hip and even in the groin as well.  Is starting to detrimentally affect her x-ray today living, quality of life, and mobility.  She does have some knee pain on occasion as well.  She denies any specific injury. HPI  Review of Systems She currently denies any headache, chest pain, shortness of breath, fever, chills, nausea, vomiting.  Objective: Vital Signs: Ht 5\' 7"  (1.702 m)   Wt 126 lb (57.2 kg)   BMI 19.73 kg/m   Physical Exam She is alert and oriented x3 and in no acute distress Ortho Exam Examination of her right hip she has full range of motion and even compressing the hip causes no pain in the groin.  The extremes of rotation causes no pain in the groin.  Her knee exam shows some patellofemoral crepitation and a lateral tracking patella.  She has severe pain though over the trochanteric area of her right hip. Specialty Comments:  No specialty comments available.  Imaging: Xr Hip Unilat W Or W/o Pelvis 2-3 Views Right  Result Date: 04/23/2017 An AP pelvis and lateral of her right hip shows no acute findings.  There is cystic changes in the central aspect of the femoral head and a small inferior para-articular osteophyte but the superior lateral joint service is wide open.    PMFS History: Patient Active Problem List   Diagnosis Date Noted  . Trochanteric bursitis, right hip 04/23/2017  . Pain in right hip 04/23/2017  . Hearing loss of right ear due to cerumen impaction 10/25/2016  . Chronic rhinitis 07/04/2015  . Cough 07/04/2015  . Endometrial polyp 01/11/2011   Past Medical History:  Diagnosis Date  . Arthritis    hips and knees  . GERD (gastroesophageal reflux disease)    occassional    Family History  Problem  Relation Age of Onset  . Allergic rhinitis Neg Hx   . Angioedema Neg Hx   . Asthma Neg Hx   . Atopy Neg Hx   . Eczema Neg Hx   . Immunodeficiency Neg Hx   . Urticaria Neg Hx     Past Surgical History:  Procedure Laterality Date  . FRACTURE SURGERY  2009   left ankle  . LAPAROSCOPY  1992   infection after tubal  . SHOULDER OPEN ROTATOR CUFF REPAIR  2009  . TUBAL LIGATION  1992   Social History   Occupational History  . Not on file  Tobacco Use  . Smoking status: Former Smoker    Last attempt to quit: 12/27/1979    Years since quitting: 37.3  . Smokeless tobacco: Never Used  Substance and Sexual Activity  . Alcohol use: Yes    Alcohol/week: 1.8 oz    Types: 3 Glasses of wine per week  . Drug use: No  . Sexual activity: No

## 2017-05-21 ENCOUNTER — Encounter (INDEPENDENT_AMBULATORY_CARE_PROVIDER_SITE_OTHER): Payer: Self-pay | Admitting: Orthopaedic Surgery

## 2017-05-21 ENCOUNTER — Ambulatory Visit (INDEPENDENT_AMBULATORY_CARE_PROVIDER_SITE_OTHER): Payer: Medicare Other | Admitting: Orthopaedic Surgery

## 2017-05-21 DIAGNOSIS — M7061 Trochanteric bursitis, right hip: Secondary | ICD-10-CM

## 2017-05-21 DIAGNOSIS — M25551 Pain in right hip: Secondary | ICD-10-CM

## 2017-05-21 NOTE — Progress Notes (Signed)
The patient is following up after I provided a trochanteric injection around the right hip area.  She said that helped greatly but she still has some pain some in the groin but really radiates down the back of the hip as well.  She said all this pain started after becoming an avid horse rider.  She is been frustrated by the pain and would like to consider physical therapy.  She denies any radicular symptoms going down to her knee or her foot or ankle at all.  She denies any back pain.  She is a very thin individual.  On exam I can put her hip easily through full range of motion with some pains at the extreme rotation some in the groin but mainly around the greater trochanteric area as well.  She has excellent strength in her lower extremity and her ligaments are equal.  At this point I would like to send her to outpatient physical therapy at [facility] and have them work on any modalities that can help improve her function and decrease her pain.  I will see her back in about 6 weeks to see how she is doing overall.  Other options would be repeat steroid injection at the trochanteric area and considering an intra-articular steroid injection in her right hip.

## 2017-05-22 ENCOUNTER — Other Ambulatory Visit (INDEPENDENT_AMBULATORY_CARE_PROVIDER_SITE_OTHER): Payer: Self-pay

## 2017-05-22 DIAGNOSIS — M25551 Pain in right hip: Secondary | ICD-10-CM

## 2017-06-05 ENCOUNTER — Ambulatory Visit: Payer: Medicare Other | Attending: Orthopaedic Surgery | Admitting: Physical Therapy

## 2017-06-05 ENCOUNTER — Encounter: Payer: Self-pay | Admitting: Physical Therapy

## 2017-06-05 DIAGNOSIS — M25551 Pain in right hip: Secondary | ICD-10-CM | POA: Insufficient documentation

## 2017-06-05 DIAGNOSIS — M6281 Muscle weakness (generalized): Secondary | ICD-10-CM | POA: Insufficient documentation

## 2017-06-05 NOTE — Therapy (Signed)
[facility] [address], [place], [place], [address] Phone: [phone]   Fax:  [phone]  Physical Therapy Evaluation  Patient Details  Name: Beverly [name] MRN: [id number] Date of Birth: [birth year] Referring Provider: Jean Rosenthal, MD    Encounter Date: 06/05/2017  PT End of Session - 06/05/17 1212    Visit Number  1    Number of Visits  12    Date for PT Re-Evaluation  07/31/17    Authorization Type  Medicare A and B     Authorization Time Period  06/05/17 to 07/31/17    Authorization - Visit Number  1    Authorization - Number of Visits  10    PT Start Time  0931    PT Stop Time  1013    PT Time Calculation (min)  42 min    Activity Tolerance  Patient tolerated treatment well;No increased pain    Behavior During Therapy  WFL for tasks assessed/performed       Past Medical History:  Diagnosis Date  . Arthritis    hips and knees  . GERD (gastroesophageal reflux disease)    occassional    Past Surgical History:  Procedure Laterality Date  . FRACTURE SURGERY  2009   left ankle  . LAPAROSCOPY  1992   infection after tubal  . SHOULDER OPEN ROTATOR CUFF REPAIR  2009  . TUBAL LIGATION  1992    There were no vitals filed for this visit.   Subjective Assessment - 06/05/17 0941    Subjective  Pt reports that she started to learn horseback riding back In June of 2018 and has noticed onset of Rt hip pain. Her pain is lateral hip which was much improved following an injection by her referring physician. She does continue to have pain in her groin and occasional sharp shooting pain that starts laterally and moves across the buttock region. She has had hip pain in the past which improved significantly with PT.     How long can you sit comfortably?  unlimited    How long can you stand comfortably?  unlimited     How long can you walk comfortably?  unlimited     Patient Stated Goals  improve her pain with  activity     Currently in Pain?  No/denies    Pain Location  Groin    Pain Orientation  Right    Pain Descriptors / Indicators  Aching;Shooting    Pain Type  Other (Comment) acute on chronic     Pain Radiating Towards  none     Pain Onset  More than a month ago    Pain Frequency  Occasional    Aggravating Factors   when adducting the leg    Pain Relieving Factors  avoiding aggravating movements     Effect of Pain on Daily Activities  mild          [facility] PT Assessment - 06/05/17 0001      Assessment   Medical Diagnosis  Pain in Rt hip    Referring Provider  Jean Rosenthal, MD     Onset Date/Surgical Date  -- 2-3 months ago     Prior Therapy  7 years ago with good results       Precautions   Precautions  None      Restrictions   Weight Bearing Restrictions  No      Balance Screen   Has the patient fallen in  the past 6 months  No    Has the patient had a decrease in activity level because of a fear of falling?   No    Is the patient reluctant to leave their home because of a fear of falling?   No      Prior Function   Level of Independence  Independent    Leisure  riding horses, walks 25 miles a day       Cognition   Overall Cognitive Status  Within Functional Limits for tasks assessed      Observation/Other Assessments   Focus on Therapeutic Outcomes (FOTO)   11% limited       Sensation   Additional Comments  Pt denies any numbness/tingling       Functional Tests   Functional tests  Squat      Squat   Comments  BLE squat, Rt knee valgus noted       ROM / Strength   AROM / PROM / Strength  AROM;Strength      AROM   Overall AROM Comments  Rt hip IR 10 deg, Lt hip IR 15 deg       Strength   Strength Assessment Site  Hip;Knee;Ankle    Right/Left Hip  Right;Left    Right Hip Flexion  4/5    Right Hip Extension  3+/5    Right Hip ABduction  3+/5    Left Hip Flexion  5/5    Left Hip Extension  3+/5    Left Hip ABduction  3+/5    Right/Left Knee   Right;Left    Right Knee Flexion  4/5    Right Knee Extension  5/5    Left Knee Flexion  4/5    Left Knee Extension  5/5    Right/Left Ankle  Right;Left    Right Ankle Dorsiflexion  5/5    Left Ankle Dorsiflexion  5/5      Flexibility   Soft Tissue Assessment /Muscle Length  yes    Hamstrings  WNL    Quadriceps  (+) hip flexor tightness (hip extension to neutral only)       Palpation   SI assessment   non tender with palpation     Palpation comment  tenderness along hip adductors and gluteals      Special Tests    Special Tests  Hip Special Tests    Hip Special Tests   Marcello Moores Test;Patrick (FABER) Test;Hip Scouring;Anterior Hip Impingement Test      Saralyn Pilar [facility]) Test   Findings  Negative      Marcello Moores Test    Findings  Positive    Comments  BLE       Hip Scouring   Findings  Negative      Anterior Hip Impingement Test    Findings  Positive    Side   Right      High Level Balance   High Level Balance Comments  SLS: Rt 5 sec (+) trendelenburg; Lt: 15 sec             Objective measurements completed on examination: See above findings.              PT Education - 06/05/17 1012    Education provided  Yes    Education Details  dry needling; eval findings/POC; nature of trigger points     Person(s) Educated  Patient    Methods  Explanation    Comprehension  Verbalized understanding  PT Short Term Goals - 06/05/17 1220      PT SHORT TERM GOAL #1   Title  Pt will demo consistency and independence with her HEP in order to improve strength and decrease pain.     Time  3    Period  Weeks    Status  New    Target Date  06/26/17      PT SHORT TERM GOAL #2   Title  Pt will demo improved hip extension ROM to atleast 5 deg which will improve her mechanics with daily walking.     Time  3    Period  Weeks    Status  New      PT SHORT TERM GOAL #3   Title  Pt will report atleast 25% reduction in her pain/symptoms with walking and horseriding.      Time  3    Period  Weeks    Status  New        PT Long Term Goals - 06/05/17 1222      PT LONG TERM GOAL #1   Title  Pt will demo improved BLE strength to 5/5 MMT whichi will increase her safety with daily activity.     Time  6    Period  Weeks    Status  New    Target Date  07/17/17      PT LONG TERM GOAL #2   Title  Pt will demo improved single leg stability evident by her ability to maintain single leg balance on each LE for atleast 10 sec without trendelenburg deviation.     Time  6    Period  Weeks    Status  New      PT LONG TERM GOAL #3   Title  Pt will report atleast 75% improvement in her pain/symptoms from the start of therapy to allow her to resume her normal walking routine without difficulty.     Time  6    Period  Weeks    Status  New      PT LONG TERM GOAL #4   Title  Pt will return to horseriding, reporting atleast 75% improvement in pain/soreness following, to allow her to resume her regular leisure activity.     Time  6    Period  Weeks    Status  New             Plan - 06/05/17 1213    Clinical Impression Statement  Pt is a pleasant and active 72 y.o F referred to OPPT with complaints of recent onset Rt hip pain noted intermittently during activity. She was diagnosed with trochanteric bursitis and given an injection which helped some. She does have weakness of the hip musculature as well as tenderness along the adductors and gluteals on the Rt more than the Lt. She also demonstrates lack of hip flexibility and poor hip control during single leg stance and squat activity. Special testing for hip joint arthritis did not reproduce any symptoms, and it is likely that her pain is related to soft tissue restrictions and poor muscle control/activation with her new activity. She would benefit from skilled PT to address her limitations in strength, flexibility, stability and facilitate return to her regular active schedule.     History and Personal Factors  relevant to plan of care:  recently began horseback riding    Clinical Presentation  Stable    Clinical Presentation due to:  some improvement with injections; reports  similar symptoms many years ago which improved with PT    Clinical Decision Making  Low    Rehab Potential  Good    PT Frequency  2x / week    PT Duration  6 weeks    PT Treatment/Interventions  ADLs/Self Care Home Management;Cryotherapy;Electrical Stimulation;Moist Heat;Therapeutic activities;Functional mobility training;Therapeutic exercise;Balance training;Patient/family education;Neuromuscular re-education;Manual techniques;Taping;Dry needling;Passive range of motion    PT Next Visit Plan  follow up on pt interest in dry needling; soft tissue techniques to address muscle spasm (adductors/glutes); hip strengthening    PT Home Exercise Plan  next visit     Consulted and Agree with Plan of Care  Patient       Patient will benefit from skilled therapeutic intervention in order to improve the following deficits and impairments:  Decreased activity tolerance, Decreased balance, Impaired flexibility, Hypomobility, Decreased strength, Decreased range of motion, Increased muscle spasms, Postural dysfunction, Pain, Improper body mechanics  Visit Diagnosis: Pain in right hip  Muscle weakness (generalized)     Problem List Patient Active Problem List   Diagnosis Date Noted  . Pain of right hip joint 05/21/2017  . Trochanteric bursitis, right hip 04/23/2017  . Pain in right hip 04/23/2017  . Hearing loss of right ear due to cerumen impaction 10/25/2016  . Chronic rhinitis 07/04/2015  . Cough 07/04/2015  . Endometrial polyp 01/11/2011    1:24 PM,06/05/17 Elly Modena PT, DPT [facility] [address], [place], [place], [address] Phone: [phone]   Fax:  [phone]  Name: Beverly [name] MRN:  [id number] Date of Birth: [birth year]

## 2017-06-05 NOTE — Patient Instructions (Signed)

## 2017-06-10 ENCOUNTER — Ambulatory Visit: Payer: Medicare Other | Admitting: Physical Therapy

## 2017-06-10 DIAGNOSIS — M6281 Muscle weakness (generalized): Secondary | ICD-10-CM

## 2017-06-10 DIAGNOSIS — M25551 Pain in right hip: Secondary | ICD-10-CM | POA: Diagnosis not present

## 2017-06-10 NOTE — Patient Instructions (Signed)
  ELASTIC BAND - SIDELYING CLAM - CLAMSHELL   While lying on your side with your knees bent and an elastic band wrapped around your knees, draw up the top knee while keeping contact of your feet together as shown.   Do not let your pelvis roll back during the lifting movement.  2 x15 reps each side      Bridge  -Lie on your back with knees bent and feet flat. The feet should be hip width apart and the knees close together but not touching/  -while maintaining position, lift the buttocks off the ground until the knees,hips, and shoulders become level.  -make sure hip extension is occurring and not lumbar extension. Make sure both hip bones come up evenly and remain level. Squueze the bottom tightly together to perform this maneuver and hold.  2x5 reps.      HIP FLEXOR STRETCH 4  While lying on a table or high bed, let the affected leg lower towards the floor until a stretch is felt along the front of your thigh.    At the same time, slowly bend your affected knee to add more stretch and grasp your opposite knee and pull it towards your chest.  Hold 30-60sec, repeat 2x each side    [facility] [address], [place], [place] [address] Phone # [phone] Fax [phone]

## 2017-06-10 NOTE — Therapy (Signed)
[facility] [address], [place], [place], [address] Phone: [phone]   Fax:  [phone]  Physical Therapy Treatment  Patient Details  Name: Beverly [name] MRN: [id number] Date of Birth: 1946/03/16 Referring Provider: Jean Rosenthal, MD    Encounter Date: 06/10/2017  PT End of Session - 06/10/17 1534    Visit Number  2    Number of Visits  12    Date for PT Re-Evaluation  07/31/17    Authorization Type  Medicare A and B     Authorization Time Period  06/05/17 to 07/31/17    Authorization - Visit Number  2    Authorization - Number of Visits  10    PT Start Time  0630    PT Stop Time  1528    PT Time Calculation (min)  42 min    Activity Tolerance  Patient tolerated treatment well;No increased pain    Behavior During Therapy  WFL for tasks assessed/performed       Past Medical History:  Diagnosis Date  . Arthritis    hips and knees  . GERD (gastroesophageal reflux disease)    occassional    Past Surgical History:  Procedure Laterality Date  . FRACTURE SURGERY  2009   left ankle  . LAPAROSCOPY  1992   infection after tubal  . SHOULDER OPEN ROTATOR CUFF REPAIR  2009  . TUBAL LIGATION  1992    There were no vitals filed for this visit.  Subjective Assessment - 06/10/17 1451    Subjective  Pt reports that things are going well, and if anything she is a little bit better. She does have some shoulder pain on the Rt which she noticed after playing golf.     How long can you sit comfortably?  unlimited    How long can you stand comfortably?  unlimited     How long can you walk comfortably?  unlimited     Patient Stated Goals  improve her pain with activity     Currently in Pain?  No/denies    Pain Onset  More than a month ago                      [facility] Adult PT Treatment/Exercise - 06/10/17 0001      Exercises   Exercises  Knee/Hip      Knee/Hip Exercises: Stretches   Hip Flexor  Stretch  2 reps;Both;30 seconds    Hip Flexor Stretch Limitations  supine thomas test position      Knee/Hip Exercises: Aerobic   Elliptical  x4 min backwards  PT present to discuss progress      Knee/Hip Exercises: Supine   Bridges  Both;2 sets;15 reps    Bridges Limitations  red TB resistance into extension     Other Supine Knee/Hip Exercises  bridge hold with alt knee extension 2x5 reps each       Knee/Hip Exercises: Sidelying   Hip ABduction  Both;1 set;15 reps;Strengthening    Hip ABduction Limitations  2#    Clams  x15 reps each, green TB              PT Education - 06/10/17 1530    Education provided  Yes    Education Details  progressing back into horse riding if she continues to be pain free; HEP    Person(s) Educated  Patient    Methods  Handout;Explanation    Comprehension  Returned  demonstration;Verbalized understanding       PT Short Term Goals - 06/05/17 1220      PT SHORT TERM GOAL #1   Title  Pt will demo consistency and independence with her HEP in order to improve strength and decrease pain.     Time  3    Period  Weeks    Status  New    Target Date  06/26/17      PT SHORT TERM GOAL #2   Title  Pt will demo improved hip extension ROM to atleast 5 deg which will improve her mechanics with daily walking.     Time  3    Period  Weeks    Status  New      PT SHORT TERM GOAL #3   Title  Pt will report atleast 25% reduction in her pain/symptoms with walking and horseriding.     Time  3    Period  Weeks    Status  New        PT Long Term Goals - 06/05/17 1222      PT LONG TERM GOAL #1   Title  Pt will demo improved BLE strength to 5/5 MMT whichi will increase her safety with daily activity.     Time  6    Period  Weeks    Status  New    Target Date  07/17/17      PT LONG TERM GOAL #2   Title  Pt will demo improved single leg stability evident by her ability to maintain single leg balance on each LE for atleast 10 sec without trendelenburg  deviation.     Time  6    Period  Weeks    Status  New      PT LONG TERM GOAL #3   Title  Pt will report atleast 75% improvement in her pain/symptoms from the start of therapy to allow her to resume her normal walking routine without difficulty.     Time  6    Period  Weeks    Status  New      PT LONG TERM GOAL #4   Title  Pt will return to horseriding, reporting atleast 75% improvement in pain/soreness following, to allow her to resume her regular leisure activity.     Time  6    Period  Weeks    Status  New            Plan - 06/10/17 1544    Clinical Impression Statement  Pt arrived today with reports of resolved pain since her evaluation. Session focused primarily on establishing her HEP with good demonstration of technique throughout. Discussed slow return to horseback riding next week assuming no pain/issues arise this week. Pt reported no pain during or following today's session. Will continue with current POC.    Rehab Potential  Good    PT Frequency  2x / week    PT Duration  6 weeks    PT Treatment/Interventions  ADLs/Self Care Home Management;Cryotherapy;Electrical Stimulation;Moist Heat;Therapeutic activities;Functional mobility training;Therapeutic exercise;Balance training;Patient/family education;Neuromuscular re-education;Manual techniques;Taping;Dry needling;Passive range of motion    PT Next Visit Plan  soft tissue techniques to address muscle spasm (adductors/glutes); hip strengthening and stability progression    PT Home Exercise Plan  bridge hold with alt knee extension, slidelying clams with green TB, hip flexor stretch    Consulted and Agree with Plan of Care  Patient       Patient will benefit from  skilled therapeutic intervention in order to improve the following deficits and impairments:  Decreased activity tolerance, Decreased balance, Impaired flexibility, Hypomobility, Decreased strength, Decreased range of motion, Increased muscle spasms, Postural  dysfunction, Pain, Improper body mechanics  Visit Diagnosis: Pain in right hip  Muscle weakness (generalized)     Problem List Patient Active Problem List   Diagnosis Date Noted  . Pain of right hip joint 05/21/2017  . Trochanteric bursitis, right hip 04/23/2017  . Pain in right hip 04/23/2017  . Hearing loss of right ear due to cerumen impaction 10/25/2016  . Chronic rhinitis 07/04/2015  . Cough 07/04/2015  . Endometrial polyp 01/11/2011   3:50 PM,06/10/17 Elly Modena PT, DPT [facility] [place]  [facility] [address], [place], [place], [address] Phone: [phone]   Fax:  [phone]  Name: Beverly [name] MRN: [id number] Date of Birth: 08/08/1945

## 2017-06-12 ENCOUNTER — Ambulatory Visit: Payer: Medicare Other | Admitting: Physical Therapy

## 2017-06-12 ENCOUNTER — Encounter: Payer: Self-pay | Admitting: Physical Therapy

## 2017-06-12 DIAGNOSIS — M25551 Pain in right hip: Secondary | ICD-10-CM

## 2017-06-12 DIAGNOSIS — M6281 Muscle weakness (generalized): Secondary | ICD-10-CM | POA: Diagnosis not present

## 2017-06-12 NOTE — Therapy (Signed)
[facility] [address], [place], [place], [address] Phone: [phone]   Fax:  [phone]  Physical Therapy Treatment  Patient Details  Name: Beverly [name] MRN: [id number] Date of Birth: 12-18-1945 Referring Provider: Jean Rosenthal, MD    Encounter Date: 06/12/2017  PT End of Session - 06/12/17 0955    Visit Number  3    Number of Visits  12    Date for PT Re-Evaluation  07/31/17    Authorization Type  Medicare A and B     Authorization Time Period  06/05/17 to 07/31/17    Authorization - Visit Number  3    Authorization - Number of Visits  10    PT Start Time  0930    PT Stop Time  1010    PT Time Calculation (min)  40 min    Activity Tolerance  Patient tolerated treatment well;No increased pain    Behavior During Therapy  WFL for tasks assessed/performed       Past Medical History:  Diagnosis Date  . Arthritis    hips and knees  . GERD (gastroesophageal reflux disease)    occassional    Past Surgical History:  Procedure Laterality Date  . FRACTURE SURGERY  2009   left ankle  . LAPAROSCOPY  1992   infection after tubal  . SHOULDER OPEN ROTATOR CUFF REPAIR  2009  . TUBAL LIGATION  1992    There were no vitals filed for this visit.  Subjective Assessment - 06/12/17 0933    Subjective  Pt reports that she has a little bit of stiffness in a few spots where she could tell she had worked her muscles, but has not had any pain/issues with her hip for atleast a week or longer. She was able to walk 6 miles yesterday without any issue.     How long can you sit comfortably?  unlimited    How long can you stand comfortably?  unlimited     How long can you walk comfortably?  unlimited     Patient Stated Goals  improve her pain with activity     Currently in Pain?  No/denies Rt shoulder has been bothering her     Pain Onset  More than a month ago         [facility] PT Assessment - 06/12/17 0001      AROM    Overall AROM Comments  Rt/Lt ankle DF 10 deg (knee extended/bent); active ankle DF 25% limited                   [facility] Adult PT Treatment/Exercise - 06/12/17 0001      Knee/Hip Exercises: Stretches   Gastroc Stretch  2 reps;30 seconds;Both;Other (comment) off step      Knee/Hip Exercises: Aerobic   Elliptical  x4 min forward, L2 incline 5 PT present to discuss PT frequency and symptoms      Knee/Hip Exercises: Standing   Hip Extension  Both;1 set;10 reps;Knee straight    Extension Limitations  yellow TB, UE support     Other Standing Knee Exercises  closed chain DF knee drive towards foam roll x10; progressed to 5 sec hold with knee drive Y40 reps each       Knee/Hip Exercises: Seated   Sit to Sand  10 reps;without UE support;Other (comment) on foam pad              PT Education - 06/12/17 3474  Education provided  Yes    Education Details  importance of needing proper control of core when riding horses; discussed style of horse riding and demands this places on the body    Person(s) Educated  Patient    Methods  Explanation    Comprehension  Verbalized understanding       PT Short Term Goals - 06/12/17 1015      PT SHORT TERM GOAL #1   Title  Pt will demo consistency and independence with her HEP in order to improve strength and decrease pain.     Time  3    Period  Weeks    Status  Achieved      PT SHORT TERM GOAL #2   Title  Pt will demo improved hip extension ROM to atleast 5 deg which will improve her mechanics with daily walking.     Time  3    Period  Weeks    Status  On-going      PT SHORT TERM GOAL #3   Title  Pt will report atleast 25% reduction in her pain/symptoms with walking and horseriding.     Time  3    Period  Weeks    Status  Achieved        PT Long Term Goals - 06/05/17 1222      PT LONG TERM GOAL #1   Title  Pt will demo improved BLE strength to 5/5 MMT whichi will increase her safety with daily activity.     Time  6     Period  Weeks    Status  New    Target Date  07/17/17      PT LONG TERM GOAL #2   Title  Pt will demo improved single leg stability evident by her ability to maintain single leg balance on each LE for atleast 10 sec without trendelenburg deviation.     Time  6    Period  Weeks    Status  New      PT LONG TERM GOAL #3   Title  Pt will report atleast 75% improvement in her pain/symptoms from the start of therapy to allow her to resume her normal walking routine without difficulty.     Time  6    Period  Weeks    Status  New      PT LONG TERM GOAL #4   Title  Pt will return to horseriding, reporting atleast 75% improvement in pain/soreness following, to allow her to resume her regular leisure activity.     Time  6    Period  Weeks    Status  New            Plan - 06/12/17 0957    Clinical Impression Statement  Pt continues to report return to walking/daily activity without her initial pain. Assessed ankle passive and active dorsiflexion this session, noting atleast 10 deg of passive ankle DF each LE, however she demonstrates lack of active ROM on each side (Rt>Lt) particularly in closed chain positions. Therapist was able to progress to more complex therex this session without any reported increase in hip pain. Pt verbalized understanding of all things discussed during today's session.     Rehab Potential  Good    PT Frequency  2x / week    PT Duration  6 weeks    PT Treatment/Interventions  ADLs/Self Care Home Management;Cryotherapy;Electrical Stimulation;Moist Heat;Therapeutic activities;Functional mobility training;Therapeutic exercise;Balance training;Patient/family education;Neuromuscular re-education;Manual techniques;Taping;Dry needling;Passive range of  motion    PT Next Visit Plan  soft tissue techniques to address muscle spasm (adductors/glutes) as needed; hip strengthening and stability progression in standing positions    PT Home Exercise Plan  bridge hold with alt knee  extension, slidelying clams with green TB, hip flexor stretch, closed chain DF    Consulted and Agree with Plan of Care  Patient       Patient will benefit from skilled therapeutic intervention in order to improve the following deficits and impairments:  Decreased activity tolerance, Decreased balance, Impaired flexibility, Hypomobility, Decreased strength, Decreased range of motion, Increased muscle spasms, Postural dysfunction, Pain, Improper body mechanics  Visit Diagnosis: Pain in right hip  Muscle weakness (generalized)     Problem List Patient Active Problem List   Diagnosis Date Noted  . Pain of right hip joint 05/21/2017  . Trochanteric bursitis, right hip 04/23/2017  . Pain in right hip 04/23/2017  . Hearing loss of right ear due to cerumen impaction 10/25/2016  . Chronic rhinitis 07/04/2015  . Cough 07/04/2015  . Endometrial polyp 01/11/2011   10:38 AM,06/12/17 Elly Modena PT, DPT [place] [facility] [address], [place], [place], [address] Phone: [phone]   Fax:  [phone]  Name: Beverly [name] MRN: [id number] Date of Birth: [birth year]

## 2017-06-12 NOTE — Patient Instructions (Signed)
   Stand with toes facing wall. Slowly try to bring your knee towards the wall, making sure the knee is in line with the outside half of your foot. You should feel this primarily in your shin. Hold 5 sec, repeat 10x each side.    [place] [address], [place], [place] [address] Phone # [phone] Fax [phone]

## 2017-06-17 ENCOUNTER — Ambulatory Visit: Payer: Medicare Other | Admitting: Physical Therapy

## 2017-06-17 DIAGNOSIS — M6281 Muscle weakness (generalized): Secondary | ICD-10-CM | POA: Diagnosis not present

## 2017-06-17 DIAGNOSIS — M25551 Pain in right hip: Secondary | ICD-10-CM

## 2017-06-17 NOTE — Therapy (Signed)
[facility] [address], [place], [place], [address] Phone: [phone]   Fax:  [phone]  Physical Therapy Treatment  Patient Details  Name: Beverly [name] MRN: [id number] Date of Birth: [birth year] Referring Provider: Jean Rosenthal, MD    Encounter Date: 06/17/2017  PT End of Session - 06/17/17 0945    Visit Number  4    Number of Visits  12    Date for PT Re-Evaluation  07/31/17    Authorization Type  Medicare A and B     Authorization Time Period  06/05/17 to 07/31/17    Authorization - Visit Number  4    Authorization - Number of Visits  10    PT Start Time  0933    PT Stop Time  1013    PT Time Calculation (min)  40 min    Activity Tolerance  Patient tolerated treatment well;No increased pain    Behavior During Therapy  WFL for tasks assessed/performed       Past Medical History:  Diagnosis Date  . Arthritis    hips and knees  . GERD (gastroesophageal reflux disease)    occassional    Past Surgical History:  Procedure Laterality Date  . FRACTURE SURGERY  2009   left ankle  . LAPAROSCOPY  1992   infection after tubal  . SHOULDER OPEN ROTATOR CUFF REPAIR  2009  . TUBAL LIGATION  1992    There were no vitals filed for this visit.  Subjective Assessment - 06/17/17 0935    Subjective  Pt reports that things continue to go great even with her long walks over the past couple of days. She will be going to [place] next week and she had to adjust her appointments. She may do some horse riding tomorrow. She does note that her shoulder is continuing to improve.     How long can you sit comfortably?  unlimited    How long can you stand comfortably?  unlimited     How long can you walk comfortably?  unlimited     Patient Stated Goals  improve her pain with activity     Currently in Pain?  No/denies    Pain Onset  More than a month ago                      [facility] Adult PT  Treatment/Exercise - 06/17/17 0001      Exercises   Exercises  Other Exercises    Other Exercises   quadruped hip extension x10 reps each, x10 reps each with red TB resistance added x10 reps       Knee/Hip Exercises: Stretches   Hip Flexor Stretch  Both;1 rep;30 seconds    Hip Flexor Stretch Limitations  supine thomas position      Knee/Hip Exercises: Aerobic   Elliptical  x4 min, L2 PT present to discuss plan for resuming horse riding       Knee/Hip Exercises: Standing   Other Standing Knee Exercises  tandem pallof press with each LE forward x10 reps, red TB resistance       Knee/Hip Exercises: Supine   Single Leg Bridge  3 sets;5 reps;Both      Knee/Hip Exercises: Sidelying   Clams  5 sec isometric hold x15 reps each     Other Sidelying Knee/Hip Exercises  side plank on elbow/knee with clamshell x5 reps each side (increased difficulty with this)  PT Education - 06/17/17 0945    Education provided  Yes    Education Details  technique with therex    Person(s) Educated  Patient    Methods  Explanation;Verbal cues;Tactile cues    Comprehension  Returned demonstration;Verbalized understanding       PT Short Term Goals - 06/17/17 1011      PT SHORT TERM GOAL #1   Title  Pt will demo consistency and independence with her HEP in order to improve strength and decrease pain.     Time  3    Period  Weeks    Status  Achieved      PT SHORT TERM GOAL #2   Title  Pt will demo improved hip extension ROM to atleast 5 deg which will improve her mechanics with daily walking.     Time  3    Period  Weeks    Status  On-going      PT SHORT TERM GOAL #3   Title  Pt will report atleast 25% reduction in her pain/symptoms with walking and horseriding.     Time  3    Period  Weeks    Status  Achieved        PT Long Term Goals - 06/17/17 0945      PT LONG TERM GOAL #1   Title  Pt will demo improved BLE strength to 5/5 MMT whichi will increase her safety with daily  activity.     Time  6    Period  Weeks    Status  On-going      PT LONG TERM GOAL #2   Title  Pt will demo improved single leg stability evident by her ability to maintain single leg balance on each LE for atleast 10 sec without trendelenburg deviation.     Time  6    Period  Weeks    Status  On-going      PT LONG TERM GOAL #3   Title  Pt will report atleast 75% improvement in her pain/symptoms from the start of therapy to allow her to resume her normal walking routine without difficulty.     Time  6    Period  Weeks    Status  Achieved      PT LONG TERM GOAL #4   Title  Pt will return to horseriding, reporting atleast 75% improvement in pain/soreness following, to allow her to resume her regular leisure activity.     Time  6    Period  Weeks    Status  On-going            Plan - 06/17/17 1009    Clinical Impression Statement  Pt continues to do well, meeting 2 more goals this session. She is able to complete her weekly walking without difficulty. Pt has not yet returned to horseback riding but may try to start this sometime this week. Progressed trunk/hip strengthening and stability exercises this session with good demonstration of technique. Progressed HEP to reflect improvements in stability and strength.     Rehab Potential  Good    PT Frequency  2x / week    PT Duration  6 weeks    PT Treatment/Interventions  ADLs/Self Care Home Management;Cryotherapy;Electrical Stimulation;Moist Heat;Therapeutic activities;Functional mobility training;Therapeutic exercise;Balance training;Patient/family education;Neuromuscular re-education;Manual techniques;Taping;Dry needling;Passive range of motion    PT Next Visit Plan  soft tissue techniques to address muscle spasm (adductors/glutes) as needed; hip strengthening and stability progression in standing positions/f/u on horseback  riding    PT Home Exercise Plan  single leg bridge, slidelying clams with green TB (hold or add the side plank),  hip flexor stretch, closed chain DF    Consulted and Agree with Plan of Care  Patient       Patient will benefit from skilled therapeutic intervention in order to improve the following deficits and impairments:  Decreased activity tolerance, Decreased balance, Impaired flexibility, Hypomobility, Decreased strength, Decreased range of motion, Increased muscle spasms, Postural dysfunction, Pain, Improper body mechanics  Visit Diagnosis: Pain in right hip  Muscle weakness (generalized)     Problem List Patient Active Problem List   Diagnosis Date Noted  . Pain of right hip joint 05/21/2017  . Trochanteric bursitis, right hip 04/23/2017  . Pain in right hip 04/23/2017  . Hearing loss of right ear due to cerumen impaction 10/25/2016  . Chronic rhinitis 07/04/2015  . Cough 07/04/2015  . Endometrial polyp 01/11/2011    11:07 AM,06/17/17 Sherol Dade PT, DPT [place] [facility] [address] [place], [place], [address] Phone: [phone]   Fax:  [phone]  Name: Bre Pecina MRN: [id number] Date of Birth: [birth year]

## 2017-06-17 NOTE — Patient Instructions (Signed)
   SINGLE LEG BRIDGE  While lying on your back with your knees bent, extend one knee as shown.   Next, raise your buttocks off the floor/bed.    Try and maintain your pelvis level the entire time.   x5 reps each side, working up to 10 in a row. Keep hips level.    [facility] [address] [place], [place] [address] Phone # [phone] Fax [phone]

## 2017-06-19 ENCOUNTER — Encounter: Payer: Medicare Other | Admitting: Physical Therapy

## 2017-06-24 ENCOUNTER — Encounter: Payer: Medicare Other | Admitting: Physical Therapy

## 2017-06-26 ENCOUNTER — Encounter: Payer: Medicare Other | Admitting: Physical Therapy

## 2017-07-01 ENCOUNTER — Encounter: Payer: Medicare Other | Admitting: Physical Therapy

## 2017-07-03 ENCOUNTER — Encounter: Payer: Self-pay | Admitting: Physical Therapy

## 2017-07-03 ENCOUNTER — Encounter: Payer: Medicare Other | Admitting: Physical Therapy

## 2017-07-03 ENCOUNTER — Ambulatory Visit (INDEPENDENT_AMBULATORY_CARE_PROVIDER_SITE_OTHER): Payer: Medicare Other | Admitting: Orthopaedic Surgery

## 2017-07-03 ENCOUNTER — Encounter (INDEPENDENT_AMBULATORY_CARE_PROVIDER_SITE_OTHER): Payer: Self-pay | Admitting: Orthopaedic Surgery

## 2017-07-03 ENCOUNTER — Ambulatory Visit: Payer: Medicare Other | Admitting: Physical Therapy

## 2017-07-03 DIAGNOSIS — M25551 Pain in right hip: Secondary | ICD-10-CM

## 2017-07-03 DIAGNOSIS — M7061 Trochanteric bursitis, right hip: Secondary | ICD-10-CM | POA: Diagnosis not present

## 2017-07-03 DIAGNOSIS — M6281 Muscle weakness (generalized): Secondary | ICD-10-CM

## 2017-07-03 NOTE — Therapy (Signed)
[facility] [address], [place], [place], [address] Phone: [phone]   Fax:  [phone]  Physical Therapy Treatment/Discharge  Patient Details  Name: Beverly [name] MRN: [id number] Date of Birth: 02-20-1946 Referring Provider: Jean Rosenthal, MD    Encounter Date: 07/03/2017  PT End of Session - 07/03/17 1043    Visit Number  5    Number of Visits  12    Date for PT Re-Evaluation  07/31/17    Authorization Type  Medicare A and B     Authorization Time Period  06/05/17 to 07/31/17    Authorization - Visit Number  5    Authorization - Number of Visits  10    PT Start Time  0932    PT Stop Time  1011    PT Time Calculation (min)  39 min    Activity Tolerance  Patient tolerated treatment well;No increased pain    Behavior During Therapy  WFL for tasks assessed/performed       Past Medical History:  Diagnosis Date  . Arthritis    hips and knees  . GERD (gastroesophageal reflux disease)    occassional    Past Surgical History:  Procedure Laterality Date  . FRACTURE SURGERY  2009   left ankle  . LAPAROSCOPY  1992   infection after tubal  . SHOULDER OPEN ROTATOR CUFF REPAIR  2009  . TUBAL LIGATION  1992    There were no vitals filed for this visit.  Subjective Assessment - 07/03/17 0934    Subjective  Pt reports that she just got back from her trip to [place]. She has not had any hip pain. She was not able to complete her HEP as regularly as she would have liked. She is comfortable with discharge today as long as she     How long can you sit comfortably?  unlimited    How long can you stand comfortably?  unlimited     How long can you walk comfortably?  unlimited     Patient Stated Goals  improve her pain with activity     Currently in Pain?  No/denies    Pain Onset  More than a month ago         [facility] PT Assessment - 07/03/17 0001      Assessment   Medical Diagnosis  Pain in Rt hip     Referring Provider  Jean Rosenthal, MD     Onset Date/Surgical Date  -- 2-3 months ago     Next MD Visit  today    Prior Therapy  7 years ago with good results       Precautions   Precautions  None      Restrictions   Weight Bearing Restrictions  No      Balance Screen   Has the patient fallen in the past 6 months  No    Has the patient had a decrease in activity level because of a fear of falling?   No    Is the patient reluctant to leave their home because of a fear of falling?   No      Prior Function   Level of Independence  Independent    Leisure  riding horses, walks 25 miles a day       Cognition   Overall Cognitive Status  Within Functional Limits for tasks assessed      Observation/Other Assessments   Focus on Therapeutic Outcomes (FOTO)  2% limited       Sensation   Additional Comments  Pt denies any numbness/tingling       Functional Tests   Functional tests  Squat      Squat   Comments  BLE squat, Rt knee valgus noted       AROM   Overall AROM Comments  Rt hip IR 10 deg, Lt hip IR 15 deg       Strength   Right Hip Flexion  5/5    Right Hip Extension  5/5    Right Hip ABduction  5/5    Left Hip Flexion  5/5    Left Hip Extension  5/5    Left Hip ABduction  5/5    Right Knee Flexion  5/5    Right Knee Extension  5/5    Left Knee Flexion  5/5    Left Knee Extension  5/5    Right Ankle Dorsiflexion  5/5    Left Ankle Dorsiflexion  5/5      Flexibility   Soft Tissue Assessment /Muscle Length  yes    Hamstrings  WNL    Quadriceps  atleast 5-10 deg hip extension in prone       Palpation   SI assessment   non tender with palpation     Palpation comment  non tender with palpation       Special Tests    Special Tests  Hip Special Tests    Hip Special Tests   Marcello Moores Test;Patrick (FABER) Test;Hip Scouring;Anterior Hip Impingement Test      Saralyn Pilar [facility]) Test   Findings  Negative      Thomas Test    Findings  Negative    Comments  --       Hip Scouring   Findings  Negative      Anterior Hip Impingement Test    Findings  Negative    Side   --      High Level Balance   High Level Balance Comments  SLS:Rt 15 sec, Lt 15 sec                   [facility] Adult PT Treatment/Exercise - 07/03/17 0001      Knee/Hip Exercises: Aerobic   Elliptical  L2 x4 min forward  PT present to discuss improvements and possible d/c       Knee/Hip Exercises: Standing   Other Standing Knee Exercises  tandem pallof press with each LE forward x15 reps, red TB resistance; side stepping Lt/Rt along countertop x2 trips down and back (red TB around feet)    Other Standing Knee Exercises  BUE pressdown with hip flexion hold x10 sec, red TB resistance, x5 reps each              PT Education - 07/03/17 1026    Education provided  Yes    Education Details  goals/progress; updates to HEP and reviewed importance of maintaining concepts covered in therapy with evetual return to horse riding; discussed ways to ease back into riding once weather permits     Person(s) Educated  Patient    Methods  Explanation;Handout    Comprehension  Verbalized understanding;Returned demonstration       PT Short Term Goals - 07/03/17 0949      PT SHORT TERM GOAL #1   Title  Pt will demo consistency and independence with her HEP in order to improve strength and decrease pain.     Time  3    Period  Weeks    Status  Achieved      PT SHORT TERM GOAL #2   Title  Pt will demo improved hip extension ROM to atleast 5 deg which will improve her mechanics with daily walking.     Baseline  atleast 5-10 deg     Time  3    Period  Weeks    Status  Achieved      PT SHORT TERM GOAL #3   Title  Pt will report atleast 25% reduction in her pain/symptoms with walking and horseriding.     Time  3    Period  Weeks    Status  Achieved        PT Long Term Goals - 07/03/17 0950      PT LONG TERM GOAL #1   Title  Pt will demo improved BLE strength to 5/5 MMT  whichi will increase her safety with daily activity.     Time  6    Period  Weeks    Status  Achieved      PT LONG TERM GOAL #2   Title  Pt will demo improved single leg stability evident by her ability to maintain single leg balance on each LE for atleast 10 sec without trendelenburg deviation.     Baseline  15 sec each side    Time  6    Period  Weeks    Status  Achieved      PT LONG TERM GOAL #3   Title  Pt will report atleast 75% improvement in her pain/symptoms from the start of therapy to allow her to resume her normal walking routine without difficulty.     Time  6    Period  Weeks    Status  Achieved      PT LONG TERM GOAL #4   Title  Pt will return to horseriding, reporting atleast 75% improvement in pain/soreness following, to allow her to resume her regular leisure activity.     Baseline  weather has not permitted return to horseback riding    Time  6    Period  Weeks    Status  Unable to assess            Plan - 07/03/17 1043    Clinical Impression Statement  Pt was discharged this visit having met all of her short and long term goals since starting therapy. She demonstrates improved BLE strength to 5/5 MMT without pain, her single leg stability has increased, her hip flexibility has greatly improved bilaterally, and she has resumed nearly all of her prior activities without report of pain/difficulty. Pt has not returned to horseback riding due to the weather, however we discussed a plan for easing back into this and updated her HEP to address further stability/strength needs moving forward. Pt is pleased with her progress and agreeable with d/c at this time to allow her to maintain her program at home.     Rehab Potential  Good    PT Frequency  2x / week    PT Duration  6 weeks    PT Treatment/Interventions  ADLs/Self Care Home Management;Cryotherapy;Electrical Stimulation;Moist Heat;Therapeutic activities;Functional mobility training;Therapeutic exercise;Balance  training;Patient/family education;Neuromuscular re-education;Manual techniques;Taping;Dry needling;Passive range of motion    PT Next Visit Plan  d/c home with HEP    PT Home Exercise Plan  single leg bridge, slidelying clams with green TB (hold or add the side plank), hip flexor stretch, closed chain  DF    Consulted and Agree with Plan of Care  Patient       Patient will benefit from skilled therapeutic intervention in order to improve the following deficits and impairments:  Decreased activity tolerance, Decreased balance, Impaired flexibility, Hypomobility, Decreased strength, Decreased range of motion, Increased muscle spasms, Postural dysfunction, Pain, Improper body mechanics  Visit Diagnosis: Pain in right hip  Muscle weakness (generalized)     Problem List Patient Active Problem List   Diagnosis Date Noted  . Pain of right hip joint 05/21/2017  . Trochanteric bursitis, right hip 04/23/2017  . Pain in right hip 04/23/2017  . Hearing loss of right ear due to cerumen impaction 10/25/2016  . Chronic rhinitis 07/04/2015  . Cough 07/04/2015  . Endometrial polyp 01/11/2011    PHYSICAL THERAPY DISCHARGE SUMMARY  Visits from Start of Care: 5  Current functional level related to goals / functional outcomes: See above for more details    Remaining deficits: See above for more details    Education / Equipment: See above for more details  Plan: Patient agrees to discharge.  Patient goals were met. Patient is being discharged due to meeting the stated rehab goals.  ?????    11:03 AM,07/03/17 Sherol Dade PT, [place] [facility] [address] [place], [place], [address] Phone: [phone]   Fax:  [phone]  Name: Emmalou Hunger MRN: [id number] Date of Birth: 1946/03/24

## 2017-07-03 NOTE — Progress Notes (Signed)
The patient is following up for right hip trochanteric bursitis after having had an injection as well as going to the extensive physical therapy.  She is an avid horse rider and has stayed off her horse.  She said that the therapist now states she is doing very good and she thinks she is as well.  She feels like she can try horseback riding and next week.  She is 72 years old and very active.  She does do stretching exercises patient is sedated to get better as well.  On examination of her right hip I can easily put her hip through full internal and external rotation with no pain with extremes of rotation.  There is no pain to palpation of the trochanteric area.  This is the same on the left side as well.  At this point all questions concerns were answered and addressed.  She will call if she has any issues at all and will follow-up as needed.  We can always inject these areas again if needed.

## 2017-09-11 DIAGNOSIS — C4491 Basal cell carcinoma of skin, unspecified: Secondary | ICD-10-CM

## 2017-09-11 DIAGNOSIS — L578 Other skin changes due to chronic exposure to nonionizing radiation: Secondary | ICD-10-CM | POA: Diagnosis not present

## 2017-09-11 DIAGNOSIS — L820 Inflamed seborrheic keratosis: Secondary | ICD-10-CM | POA: Diagnosis not present

## 2017-09-11 DIAGNOSIS — D1801 Hemangioma of skin and subcutaneous tissue: Secondary | ICD-10-CM | POA: Diagnosis not present

## 2017-09-11 DIAGNOSIS — L821 Other seborrheic keratosis: Secondary | ICD-10-CM | POA: Diagnosis not present

## 2017-09-11 DIAGNOSIS — D485 Neoplasm of uncertain behavior of skin: Secondary | ICD-10-CM | POA: Diagnosis not present

## 2017-09-11 DIAGNOSIS — C44519 Basal cell carcinoma of skin of other part of trunk: Secondary | ICD-10-CM | POA: Diagnosis not present

## 2017-09-11 DIAGNOSIS — L812 Freckles: Secondary | ICD-10-CM | POA: Diagnosis not present

## 2017-09-11 DIAGNOSIS — D2270 Melanocytic nevi of unspecified lower limb, including hip: Secondary | ICD-10-CM | POA: Diagnosis not present

## 2017-09-11 DIAGNOSIS — L570 Actinic keratosis: Secondary | ICD-10-CM | POA: Diagnosis not present

## 2017-09-11 DIAGNOSIS — L603 Nail dystrophy: Secondary | ICD-10-CM | POA: Diagnosis not present

## 2017-09-11 DIAGNOSIS — D225 Melanocytic nevi of trunk: Secondary | ICD-10-CM | POA: Diagnosis not present

## 2017-09-11 DIAGNOSIS — Z1283 Encounter for screening for malignant neoplasm of skin: Secondary | ICD-10-CM | POA: Diagnosis not present

## 2017-09-11 DIAGNOSIS — D2260 Melanocytic nevi of unspecified upper limb, including shoulder: Secondary | ICD-10-CM | POA: Diagnosis not present

## 2017-09-11 HISTORY — DX: Basal cell carcinoma of skin, unspecified: C44.91

## 2017-09-22 DIAGNOSIS — N952 Postmenopausal atrophic vaginitis: Secondary | ICD-10-CM | POA: Diagnosis not present

## 2017-09-22 DIAGNOSIS — Z79890 Hormone replacement therapy (postmenopausal): Secondary | ICD-10-CM | POA: Diagnosis not present

## 2017-09-22 DIAGNOSIS — Z01419 Encounter for gynecological examination (general) (routine) without abnormal findings: Secondary | ICD-10-CM | POA: Diagnosis not present

## 2017-09-22 DIAGNOSIS — Z124 Encounter for screening for malignant neoplasm of cervix: Secondary | ICD-10-CM | POA: Diagnosis not present

## 2017-10-06 DIAGNOSIS — C44519 Basal cell carcinoma of skin of other part of trunk: Secondary | ICD-10-CM | POA: Diagnosis not present

## 2017-10-06 DIAGNOSIS — M5136 Other intervertebral disc degeneration, lumbar region: Secondary | ICD-10-CM | POA: Diagnosis not present

## 2017-10-06 DIAGNOSIS — M9903 Segmental and somatic dysfunction of lumbar region: Secondary | ICD-10-CM | POA: Diagnosis not present

## 2017-10-07 DIAGNOSIS — M9903 Segmental and somatic dysfunction of lumbar region: Secondary | ICD-10-CM | POA: Diagnosis not present

## 2017-10-07 DIAGNOSIS — M5136 Other intervertebral disc degeneration, lumbar region: Secondary | ICD-10-CM | POA: Diagnosis not present

## 2017-10-08 DIAGNOSIS — M5136 Other intervertebral disc degeneration, lumbar region: Secondary | ICD-10-CM | POA: Diagnosis not present

## 2017-10-08 DIAGNOSIS — M9903 Segmental and somatic dysfunction of lumbar region: Secondary | ICD-10-CM | POA: Diagnosis not present

## 2017-10-09 DIAGNOSIS — M5136 Other intervertebral disc degeneration, lumbar region: Secondary | ICD-10-CM | POA: Diagnosis not present

## 2017-10-09 DIAGNOSIS — M9903 Segmental and somatic dysfunction of lumbar region: Secondary | ICD-10-CM | POA: Diagnosis not present

## 2017-10-13 DIAGNOSIS — M9903 Segmental and somatic dysfunction of lumbar region: Secondary | ICD-10-CM | POA: Diagnosis not present

## 2017-10-13 DIAGNOSIS — M5136 Other intervertebral disc degeneration, lumbar region: Secondary | ICD-10-CM | POA: Diagnosis not present

## 2017-10-14 DIAGNOSIS — M5136 Other intervertebral disc degeneration, lumbar region: Secondary | ICD-10-CM | POA: Diagnosis not present

## 2017-10-14 DIAGNOSIS — M9903 Segmental and somatic dysfunction of lumbar region: Secondary | ICD-10-CM | POA: Diagnosis not present

## 2017-10-15 DIAGNOSIS — M9903 Segmental and somatic dysfunction of lumbar region: Secondary | ICD-10-CM | POA: Diagnosis not present

## 2017-10-15 DIAGNOSIS — M5136 Other intervertebral disc degeneration, lumbar region: Secondary | ICD-10-CM | POA: Diagnosis not present

## 2017-10-16 DIAGNOSIS — M5136 Other intervertebral disc degeneration, lumbar region: Secondary | ICD-10-CM | POA: Diagnosis not present

## 2017-10-16 DIAGNOSIS — M9903 Segmental and somatic dysfunction of lumbar region: Secondary | ICD-10-CM | POA: Diagnosis not present

## 2017-11-20 DIAGNOSIS — M5136 Other intervertebral disc degeneration, lumbar region: Secondary | ICD-10-CM | POA: Diagnosis not present

## 2017-11-20 DIAGNOSIS — M9903 Segmental and somatic dysfunction of lumbar region: Secondary | ICD-10-CM | POA: Diagnosis not present

## 2017-11-27 DIAGNOSIS — M5136 Other intervertebral disc degeneration, lumbar region: Secondary | ICD-10-CM | POA: Diagnosis not present

## 2017-11-27 DIAGNOSIS — M9903 Segmental and somatic dysfunction of lumbar region: Secondary | ICD-10-CM | POA: Diagnosis not present

## 2017-12-02 DIAGNOSIS — M5136 Other intervertebral disc degeneration, lumbar region: Secondary | ICD-10-CM | POA: Diagnosis not present

## 2017-12-02 DIAGNOSIS — M9903 Segmental and somatic dysfunction of lumbar region: Secondary | ICD-10-CM | POA: Diagnosis not present

## 2017-12-03 DIAGNOSIS — M5136 Other intervertebral disc degeneration, lumbar region: Secondary | ICD-10-CM | POA: Diagnosis not present

## 2017-12-03 DIAGNOSIS — M9903 Segmental and somatic dysfunction of lumbar region: Secondary | ICD-10-CM | POA: Diagnosis not present

## 2017-12-11 DIAGNOSIS — M9903 Segmental and somatic dysfunction of lumbar region: Secondary | ICD-10-CM | POA: Diagnosis not present

## 2017-12-11 DIAGNOSIS — M5136 Other intervertebral disc degeneration, lumbar region: Secondary | ICD-10-CM | POA: Diagnosis not present

## 2017-12-25 DIAGNOSIS — M9903 Segmental and somatic dysfunction of lumbar region: Secondary | ICD-10-CM | POA: Diagnosis not present

## 2017-12-25 DIAGNOSIS — M5136 Other intervertebral disc degeneration, lumbar region: Secondary | ICD-10-CM | POA: Diagnosis not present

## 2018-02-04 DIAGNOSIS — M9903 Segmental and somatic dysfunction of lumbar region: Secondary | ICD-10-CM | POA: Diagnosis not present

## 2018-02-04 DIAGNOSIS — M5136 Other intervertebral disc degeneration, lumbar region: Secondary | ICD-10-CM | POA: Diagnosis not present

## 2018-02-11 DIAGNOSIS — M5136 Other intervertebral disc degeneration, lumbar region: Secondary | ICD-10-CM | POA: Diagnosis not present

## 2018-02-11 DIAGNOSIS — M9903 Segmental and somatic dysfunction of lumbar region: Secondary | ICD-10-CM | POA: Diagnosis not present

## 2018-02-18 DIAGNOSIS — M5136 Other intervertebral disc degeneration, lumbar region: Secondary | ICD-10-CM | POA: Diagnosis not present

## 2018-02-18 DIAGNOSIS — M9903 Segmental and somatic dysfunction of lumbar region: Secondary | ICD-10-CM | POA: Diagnosis not present

## 2018-03-04 DIAGNOSIS — M5136 Other intervertebral disc degeneration, lumbar region: Secondary | ICD-10-CM | POA: Diagnosis not present

## 2018-03-04 DIAGNOSIS — M9903 Segmental and somatic dysfunction of lumbar region: Secondary | ICD-10-CM | POA: Diagnosis not present

## 2018-03-06 DIAGNOSIS — Z23 Encounter for immunization: Secondary | ICD-10-CM | POA: Diagnosis not present

## 2018-03-09 ENCOUNTER — Other Ambulatory Visit: Payer: Self-pay | Admitting: Obstetrics & Gynecology

## 2018-03-09 DIAGNOSIS — Z1231 Encounter for screening mammogram for malignant neoplasm of breast: Secondary | ICD-10-CM

## 2018-03-25 DIAGNOSIS — M9903 Segmental and somatic dysfunction of lumbar region: Secondary | ICD-10-CM | POA: Diagnosis not present

## 2018-03-25 DIAGNOSIS — M5136 Other intervertebral disc degeneration, lumbar region: Secondary | ICD-10-CM | POA: Diagnosis not present

## 2018-04-06 DIAGNOSIS — M9903 Segmental and somatic dysfunction of lumbar region: Secondary | ICD-10-CM | POA: Diagnosis not present

## 2018-04-06 DIAGNOSIS — M5136 Other intervertebral disc degeneration, lumbar region: Secondary | ICD-10-CM | POA: Diagnosis not present

## 2018-04-20 ENCOUNTER — Ambulatory Visit (INDEPENDENT_AMBULATORY_CARE_PROVIDER_SITE_OTHER): Payer: Medicare Other | Admitting: Orthopaedic Surgery

## 2018-04-21 DIAGNOSIS — M9903 Segmental and somatic dysfunction of lumbar region: Secondary | ICD-10-CM | POA: Diagnosis not present

## 2018-04-21 DIAGNOSIS — M5136 Other intervertebral disc degeneration, lumbar region: Secondary | ICD-10-CM | POA: Diagnosis not present

## 2018-04-22 ENCOUNTER — Ambulatory Visit
Admission: RE | Admit: 2018-04-22 | Discharge: 2018-04-22 | Disposition: A | Discharge status: Home / Self Care | Payer: Medicare Other | Source: Ambulatory Visit | Attending: Obstetrics & Gynecology | Admitting: Obstetrics & Gynecology

## 2018-04-22 DIAGNOSIS — Z1231 Encounter for screening mammogram for malignant neoplasm of breast: Secondary | ICD-10-CM

## 2018-04-24 ENCOUNTER — Telehealth (INDEPENDENT_AMBULATORY_CARE_PROVIDER_SITE_OTHER): Payer: Self-pay | Admitting: Orthopaedic Surgery

## 2018-04-24 NOTE — Telephone Encounter (Signed)
Patient called wanting to let Dr. Ninfa Linden that she did not no show her appointment that she actually left a vm cancelling her appointment

## 2018-05-07 DIAGNOSIS — M5136 Other intervertebral disc degeneration, lumbar region: Secondary | ICD-10-CM | POA: Diagnosis not present

## 2018-05-07 DIAGNOSIS — M9903 Segmental and somatic dysfunction of lumbar region: Secondary | ICD-10-CM | POA: Diagnosis not present

## 2018-05-14 DIAGNOSIS — M5136 Other intervertebral disc degeneration, lumbar region: Secondary | ICD-10-CM | POA: Diagnosis not present

## 2018-05-14 DIAGNOSIS — M9903 Segmental and somatic dysfunction of lumbar region: Secondary | ICD-10-CM | POA: Diagnosis not present

## 2018-06-02 DIAGNOSIS — M5136 Other intervertebral disc degeneration, lumbar region: Secondary | ICD-10-CM | POA: Diagnosis not present

## 2018-06-02 DIAGNOSIS — M9903 Segmental and somatic dysfunction of lumbar region: Secondary | ICD-10-CM | POA: Diagnosis not present

## 2018-06-16 DIAGNOSIS — M9903 Segmental and somatic dysfunction of lumbar region: Secondary | ICD-10-CM | POA: Diagnosis not present

## 2018-06-16 DIAGNOSIS — M5136 Other intervertebral disc degeneration, lumbar region: Secondary | ICD-10-CM | POA: Diagnosis not present

## 2018-06-29 DIAGNOSIS — Z136 Encounter for screening for cardiovascular disorders: Secondary | ICD-10-CM | POA: Diagnosis not present

## 2018-06-29 DIAGNOSIS — E7800 Pure hypercholesterolemia, unspecified: Secondary | ICD-10-CM | POA: Diagnosis not present

## 2018-06-29 DIAGNOSIS — Z1211 Encounter for screening for malignant neoplasm of colon: Secondary | ICD-10-CM | POA: Diagnosis not present

## 2018-06-29 DIAGNOSIS — Z1159 Encounter for screening for other viral diseases: Secondary | ICD-10-CM | POA: Diagnosis not present

## 2018-06-29 DIAGNOSIS — Z1389 Encounter for screening for other disorder: Secondary | ICD-10-CM | POA: Diagnosis not present

## 2018-06-29 DIAGNOSIS — Z0000 Encounter for general adult medical examination without abnormal findings: Secondary | ICD-10-CM | POA: Diagnosis not present

## 2018-06-29 DIAGNOSIS — R1310 Dysphagia, unspecified: Secondary | ICD-10-CM | POA: Diagnosis not present

## 2018-06-29 DIAGNOSIS — Z23 Encounter for immunization: Secondary | ICD-10-CM | POA: Diagnosis not present

## 2018-06-29 DIAGNOSIS — M25551 Pain in right hip: Secondary | ICD-10-CM | POA: Diagnosis not present

## 2018-06-29 DIAGNOSIS — M25552 Pain in left hip: Secondary | ICD-10-CM | POA: Diagnosis not present

## 2018-06-29 DIAGNOSIS — R030 Elevated blood-pressure reading, without diagnosis of hypertension: Secondary | ICD-10-CM | POA: Diagnosis not present

## 2018-06-30 DIAGNOSIS — M5136 Other intervertebral disc degeneration, lumbar region: Secondary | ICD-10-CM | POA: Diagnosis not present

## 2018-06-30 DIAGNOSIS — M9903 Segmental and somatic dysfunction of lumbar region: Secondary | ICD-10-CM | POA: Diagnosis not present

## 2018-07-14 DIAGNOSIS — M5136 Other intervertebral disc degeneration, lumbar region: Secondary | ICD-10-CM | POA: Diagnosis not present

## 2018-07-14 DIAGNOSIS — M9903 Segmental and somatic dysfunction of lumbar region: Secondary | ICD-10-CM | POA: Diagnosis not present

## 2018-08-13 ENCOUNTER — Encounter (INDEPENDENT_AMBULATORY_CARE_PROVIDER_SITE_OTHER): Payer: Self-pay | Admitting: Orthopaedic Surgery

## 2018-08-13 ENCOUNTER — Ambulatory Visit (INDEPENDENT_AMBULATORY_CARE_PROVIDER_SITE_OTHER): Payer: Medicare Other | Admitting: Orthopaedic Surgery

## 2018-08-13 ENCOUNTER — Ambulatory Visit (INDEPENDENT_AMBULATORY_CARE_PROVIDER_SITE_OTHER): Payer: Medicare Other

## 2018-08-13 ENCOUNTER — Other Ambulatory Visit: Payer: Self-pay

## 2018-08-13 DIAGNOSIS — M25551 Pain in right hip: Secondary | ICD-10-CM | POA: Diagnosis not present

## 2018-08-13 DIAGNOSIS — M25552 Pain in left hip: Secondary | ICD-10-CM

## 2018-08-13 NOTE — Progress Notes (Signed)
Office Visit Note   Patient: Beverly [name]           Date of Birth: 1946-05-19           MRN: [id number] Visit Date: 08/13/2018              Requested by: Antony Contras, MD [place], [place] [address] PCP: Antony Contras, MD   Assessment & Plan: Visit Diagnoses:  1. Pain in right hip   2. Pain of left hip joint     Plan: Given the severity of her groin pain and what I am seeing on clinical exam and x-rays I would like to obtain an MRI of her pelvis to assess the cartilage of both hips.  She feels this is reasonable as well.  I think this is medically reasonable given the fact that we are trying determine whether or not a hip replacement on either hip would be warranted.  We will see her back after the MRI.  Follow-Up Instructions: Return in about 4 weeks (around 09/10/2018).   Orders:  Orders Placed This Encounter  Procedures  . XR Pelvis 1-2 Views   No orders of the defined types were placed in this encounter.     Procedures: No procedures performed   Clinical Data: No additional findings.   Subjective: Chief Complaint  Patient presents with  . Right Hip - Pain  Patient is well-known to me.  She is a very active and young appearing 73 year old who is petite and very limber as well due to being very active and athletic.  She does see a chiropractor on occasion as well.  I saw her for her right hip in 2018 and provided steroid injection of the trochanteric area where she is most tender.  X-rays at the time did show a cystic change which was large in the femoral head and some small para-articular osteophytes but a well-maintained superior lateral joint space.  Her range of motion was full at the time of her right hip and the pain was only over the trochanteric area.  She says that since then she has no pain over trochanteric area of either hip but now she gets a lot of pain in the groin on both sides.  It is activity related.  If she sits for long period  time and gets up to walk she is stiff in her hips and she has to walk off the pain.  It is only in the groin on both sides.  It is slowly gotten worse.  She has tried activity modification as well as rest.  She tried anti-inflammatories and she is given [place] a while to try to get things better but it is just not improving.  HPI  Review of Systems She currently denies any headache, chest pain, shortness of breath, fever, chills, nausea, vomiting  Objective: Vital Signs: There were no vitals taken for this visit.  Physical Exam She is alert and orient x3 and in no acute distress Ortho Exam Examination of her hip show that she has pain in the groin with examination of both hips and rotation.  Both hips rotate fully but shows significant lateral pain in the groin on both sides.  There is no blocks to rotation.  There is no pain in the trochanteric area on either hip to palpation or with motion. Specialty Comments:  No specialty comments available.  Imaging: Xr Pelvis 1-2 Views  Result Date: 08/13/2018 An AP pelvis shows cystic changes  in the femoral head on the right side and some particular osteophytes on both hips.  The superior lateral joint space is well-maintained on both hips.    PMFS History: Patient Active Problem List   Diagnosis Date Noted  . Pain of right hip joint 05/21/2017  . Trochanteric bursitis, right hip 04/23/2017  . Pain in right hip 04/23/2017  . Hearing loss of right ear due to cerumen impaction 10/25/2016  . Chronic rhinitis 07/04/2015  . Cough 07/04/2015  . Endometrial polyp 01/11/2011   Past Medical History:  Diagnosis Date  . Arthritis    hips and knees  . GERD (gastroesophageal reflux disease)    occassional    Family History  Problem Relation Age of Onset  . Allergic rhinitis Neg Hx   . Angioedema Neg Hx   . Asthma Neg Hx   . Atopy Neg Hx   . Eczema Neg Hx   . Immunodeficiency Neg Hx   . Urticaria Neg Hx     Past Surgical History:  Procedure  Laterality Date  . FRACTURE SURGERY  2009   left ankle  . LAPAROSCOPY  1992   infection after tubal  . SHOULDER OPEN ROTATOR CUFF REPAIR  2009  . TUBAL LIGATION  1992   Social History   Occupational History  . Not on file  Tobacco Use  . Smoking status: Former Smoker    Last attempt to quit: 12/27/1979    Years since quitting: 38.6  . Smokeless tobacco: Never Used  Substance and Sexual Activity  . Alcohol use: Yes    Alcohol/week: 3.0 standard drinks    Types: 3 Glasses of wine per week  . Drug use: No  . Sexual activity: Never

## 2018-08-14 ENCOUNTER — Other Ambulatory Visit (INDEPENDENT_AMBULATORY_CARE_PROVIDER_SITE_OTHER): Payer: Self-pay

## 2018-08-14 DIAGNOSIS — M25551 Pain in right hip: Secondary | ICD-10-CM

## 2018-08-14 DIAGNOSIS — M25552 Pain in left hip: Principal | ICD-10-CM

## 2018-08-26 DIAGNOSIS — D229 Melanocytic nevi, unspecified: Secondary | ICD-10-CM | POA: Diagnosis not present

## 2018-08-26 DIAGNOSIS — D1800 Hemangioma unspecified site: Secondary | ICD-10-CM | POA: Diagnosis not present

## 2018-08-26 DIAGNOSIS — L821 Other seborrheic keratosis: Secondary | ICD-10-CM | POA: Diagnosis not present

## 2018-08-26 DIAGNOSIS — L570 Actinic keratosis: Secondary | ICD-10-CM | POA: Diagnosis not present

## 2018-08-26 DIAGNOSIS — L820 Inflamed seborrheic keratosis: Secondary | ICD-10-CM | POA: Diagnosis not present

## 2018-08-26 DIAGNOSIS — Z85828 Personal history of other malignant neoplasm of skin: Secondary | ICD-10-CM | POA: Diagnosis not present

## 2018-08-26 DIAGNOSIS — L812 Freckles: Secondary | ICD-10-CM | POA: Diagnosis not present

## 2018-08-26 DIAGNOSIS — Z1283 Encounter for screening for malignant neoplasm of skin: Secondary | ICD-10-CM | POA: Diagnosis not present

## 2018-08-26 DIAGNOSIS — L578 Other skin changes due to chronic exposure to nonionizing radiation: Secondary | ICD-10-CM | POA: Diagnosis not present

## 2018-08-26 DIAGNOSIS — D485 Neoplasm of uncertain behavior of skin: Secondary | ICD-10-CM | POA: Diagnosis not present

## 2018-08-26 DIAGNOSIS — L814 Other melanin hyperpigmentation: Secondary | ICD-10-CM | POA: Diagnosis not present

## 2018-09-10 ENCOUNTER — Ambulatory Visit (INDEPENDENT_AMBULATORY_CARE_PROVIDER_SITE_OTHER): Payer: Medicare Other | Admitting: Orthopaedic Surgery

## 2018-11-05 ENCOUNTER — Other Ambulatory Visit: Payer: Medicare Other

## 2018-11-09 ENCOUNTER — Ambulatory Visit: Payer: Medicare Other | Admitting: Physician Assistant

## 2018-12-03 ENCOUNTER — Other Ambulatory Visit: Payer: Self-pay

## 2018-12-03 ENCOUNTER — Ambulatory Visit
Admission: RE | Admit: 2018-12-03 | Discharge: 2018-12-03 | Disposition: A | Discharge status: Home / Self Care | Payer: Medicare Other | Source: Ambulatory Visit | Attending: Orthopaedic Surgery | Admitting: Orthopaedic Surgery

## 2018-12-03 DIAGNOSIS — M25552 Pain in left hip: Secondary | ICD-10-CM

## 2018-12-03 DIAGNOSIS — M160 Bilateral primary osteoarthritis of hip: Secondary | ICD-10-CM | POA: Diagnosis not present

## 2018-12-03 DIAGNOSIS — M25551 Pain in right hip: Secondary | ICD-10-CM

## 2018-12-03 DIAGNOSIS — M47816 Spondylosis without myelopathy or radiculopathy, lumbar region: Secondary | ICD-10-CM | POA: Diagnosis not present

## 2018-12-07 ENCOUNTER — Encounter: Payer: Self-pay | Admitting: Physician Assistant

## 2018-12-07 ENCOUNTER — Other Ambulatory Visit: Payer: Self-pay

## 2018-12-07 ENCOUNTER — Ambulatory Visit (INDEPENDENT_AMBULATORY_CARE_PROVIDER_SITE_OTHER): Payer: Medicare Other | Admitting: Physician Assistant

## 2018-12-07 DIAGNOSIS — M1611 Unilateral primary osteoarthritis, right hip: Secondary | ICD-10-CM | POA: Diagnosis not present

## 2018-12-07 DIAGNOSIS — M1612 Unilateral primary osteoarthritis, left hip: Secondary | ICD-10-CM | POA: Diagnosis not present

## 2018-12-07 NOTE — Progress Notes (Addendum)
Office Visit Note   Patient: Beverly [name]           Date of Birth: [birth year]           MRN: [id number] Visit Date: 12/07/2018              Requested by: Antony Contras, MD [place] [place],  [place] [address] PCP: Antony Contras, MD   Assessment & Plan: Visit Diagnoses:  1. Primary osteoarthritis of left hip   2. Primary osteoarthritis of right hip     Plan: Given patient's MRI findings of severe osteoarthritis right is involving the right hip and the fact that affects her activities of daily living recommend right total hip arthroplasty.  The MRI also shows moderate to severe arthritis of her left hip.  She will eventually need both hips replaced.  Right now as of this date her right hip is bothering her more.  If that changes we can certainly replace her left hip first.  Again I feel both hips are going to eventually need to be replaced based on her clinical exam and MRI findings.  Questions were encouraged and answered at length.  Postop protocol reviewed with patient.  Risk including but not limited to DVT/PE, infection, worsening pain, prolonged pain, nerve vessel injury, and prolonged pain.  She will follow-up with [place] 2 weeks postop.  Given Samella Parr card and will call to schedule surgery with her after discussing this with her family.  Follow-Up Instructions: Return for 2 weeks postop.   Orders:  No orders of the defined types were placed in this encounter.  No orders of the defined types were placed in this encounter.     Procedures: No procedures performed   Clinical Data: No additional findings.   Subjective: No chief complaint on file.   HPI Beverly [name] is a 73 year old female comes in today to go over her MRI of her pelvis to assess cartilage of both hips.  She continues to have pain in the groin area both hips and proximal thighs.  She states she has no pain when seated.  Pain is worse whenever she is up walking.  She does like to walk for  exercise.  Pain is progressively getting worse.  She is tried anti-inflammatories without any real change.  Also modified her activity with no real relief. Pelvis MRI dated 12/03/2018 is reviewed with patient and shows severe osteoarthritis of the right hip and moderate to severe arthritis of the left hip.  Right hip showed high-grade partial-thickness cartilage loss with areas of full-thickness cartilage loss of the right femoral head and acetabulum with cystic changes.  Right superior anterior labral tear was noted.  Left hip showed partial thickness cartilage loss with areas of full-thickness cartilage loss involving the femoral head and acetabulum with subchondral cystic changes and marginal osteophytes.  No acute fractures no evidence of AVN.  Review of Systems Please see HPI otherwise negative or noncontributory.  Objective: Vital Signs: There were no vitals taken for this visit.  Physical Exam Constitutional:      Appearance: She is not ill-appearing or diaphoretic.  Pulmonary:     Effort: Pulmonary effort is normal.  Neurological:     Mental Status: She is alert and oriented to person, place, and time.  Psychiatric:        Mood and Affect: Mood normal.        Behavior: Behavior normal.     Ortho Exam Bilateral hips overall good range  of motion she has pain right greater than left with internal rotation.  Ambulates without any assistive device.  Calf supple nontender. Specialty Comments:  No specialty comments available.  Imaging: No results found.   PMFS History: Patient Active Problem List   Diagnosis Date Noted  . Primary osteoarthritis of left hip 12/07/2018  . Primary osteoarthritis of right hip 12/07/2018  . Pain of right hip joint 05/21/2017  . Trochanteric bursitis, right hip 04/23/2017  . Pain in right hip 04/23/2017  . Hearing loss of right ear due to cerumen impaction 10/25/2016  . Chronic rhinitis 07/04/2015  . Cough 07/04/2015  . Endometrial polyp  01/11/2011   Past Medical History:  Diagnosis Date  . Arthritis    hips and knees  . GERD (gastroesophageal reflux disease)    occassional    Family History  Problem Relation Age of Onset  . Allergic rhinitis Neg Hx   . Angioedema Neg Hx   . Asthma Neg Hx   . Atopy Neg Hx   . Eczema Neg Hx   . Immunodeficiency Neg Hx   . Urticaria Neg Hx     Past Surgical History:  Procedure Laterality Date  . FRACTURE SURGERY  2009   left ankle  . LAPAROSCOPY  1992   infection after tubal  . SHOULDER OPEN ROTATOR CUFF REPAIR  2009  . TUBAL LIGATION  1992   Social History   Occupational History  . Not on file  Tobacco Use  . Smoking status: Former Smoker    Quit date: 12/27/1979    Years since quitting: 38.9  . Smokeless tobacco: Never Used  Substance and Sexual Activity  . Alcohol use: Yes    Alcohol/week: 3.0 standard drinks    Types: 3 Glasses of wine per week  . Drug use: No  . Sexual activity: Never

## 2018-12-08 DIAGNOSIS — Z79890 Hormone replacement therapy (postmenopausal): Secondary | ICD-10-CM | POA: Diagnosis not present

## 2018-12-08 DIAGNOSIS — Z124 Encounter for screening for malignant neoplasm of cervix: Secondary | ICD-10-CM | POA: Diagnosis not present

## 2019-01-18 ENCOUNTER — Other Ambulatory Visit: Payer: Self-pay | Admitting: Physician Assistant

## 2019-01-22 ENCOUNTER — Encounter (HOSPITAL_COMMUNITY): Payer: Self-pay

## 2019-01-22 NOTE — Patient Instructions (Signed)
DUE TO COVID-19 ONLY ONE VISITOR IS ALLOWED TO COME WITH YOU AND STAY IN THE WAITING ROOM ONLY DURING PRE OP AND PROCEDURE. THE ONE VISITOR MAY VISIT WITH YOU IN YOUR PRIVATE ROOM DURING VISITING HOURS ONLY!!   COVID SWAB TESTING MUST BE COMPLETED ON:  Today, Immediately after pre op appointment.  [address] [place] [facility] enter pre surgical testing line (Must self quarantine after testing. Follow instructions on handout.)             Your procedure is scheduled on: Friday, Aug. 28, 2020   Report to [facility] Main  Entrance   Report to Short Stay at 6:00 AM   Call this number if you have problems the morning of surgery [phone]   Do not eat food:After Midnight.   May have liquids until 5:30AM day of surgery   CLEAR LIQUID DIET  Foods Allowed                                                                     Foods Excluded  Water, Black Coffee and tea, regular and decaf                             liquids that you cannot  Plain Jell-O in any flavor  (No red)                                           see through such as: Fruit ices (not with fruit pulp)                                     milk, soups, orange juice  Iced Popsicles (No red)                                    All solid food Carbonated beverages, regular and diet                                    Apple juices Sports drinks like Gatorade (No red) Lightly seasoned clear broth or consume(fat free) Sugar, honey syrup  Sample Menu Breakfast                                Lunch                                     Supper Cranberry juice                    Beef broth                            Chicken broth Jell-O  Grape juice                           Apple juice Coffee or tea                        Jell-O                                      Popsicle                                                Coffee or tea                        Coffee  or tea   Complete one Ensure drink the morning of surgery at 5:30AM the day of surgery.   Brush your teeth the morning of surgery.   Do NOT smoke after Midnight   Take these medicines the morning of surgery with A SIP OF WATER: None                               You may not have any metal on your body including hair pins, jewelry, and body piercings             Do not wear make-up, lotions, powders, perfumes/cologne, or deodorant             Do not wear nail polish.  Do not shave  48 hours prior to surgery.               Do not bring valuables to the hospital. [place].   Contacts, dentures or bridgework may not be worn into surgery.   Bring small overnight bag day of surgery.    Special Instructions: Bring a copy of your healthcare power of attorney and living will documents         the day of surgery if you haven't scanned them in before.              Please read over the following fact sheets you were given:  [facility] - Preparing for Surgery Before surgery, you can play an important role.  Because skin is not sterile, your skin needs to be as free of germs as possible.  You can reduce the number of germs on your skin by washing with CHG (chlorahexidine gluconate) soap before surgery.  CHG is an antiseptic cleaner which kills germs and bonds with the skin to continue killing germs even after washing. Please DO NOT use if you have an allergy to CHG or antibacterial soaps.  If your skin becomes reddened/irritated stop using the CHG and inform your nurse when you arrive at Short Stay. Do not shave (including legs and underarms) for at least 48 hours prior to the first CHG shower.  You may shave your face/neck.  Please follow these instructions carefully:  1.  Shower with CHG Soap the night before surgery and the  morning of surgery.  2.  If you choose to wash your hair,  wash your hair first as usual with your normal  shampoo.  3.   After you shampoo, rinse your hair and body thoroughly to remove the shampoo.                             4.  Use CHG as you would any other liquid soap.  You can apply chg directly to the skin and wash.  Gently with a scrungie or clean washcloth.  5.  Apply the CHG Soap to your body ONLY FROM THE NECK DOWN.   Do   not use on face/ open                           Wound or open sores. Avoid contact with eyes, ears mouth and   genitals (private parts).                       Wash face,  Genitals (private parts) with your normal soap.             6.  Wash thoroughly, paying special attention to the area where your    surgery  will be performed.  7.  Thoroughly rinse your body with warm water from the neck down.  8.  DO NOT shower/wash with your normal soap after using and rinsing off the CHG Soap.                9.  Pat yourself dry with a clean towel.            10.  Wear clean pajamas.            11.  Place clean sheets on your bed the night of your first shower and do not  sleep with pets. Day of Surgery : Do not apply any lotions/deodorants the morning of surgery.  Please wear clean clothes to the hospital/surgery center.  FAILURE TO FOLLOW THESE INSTRUCTIONS MAY RESULT IN THE CANCELLATION OF YOUR SURGERY  PATIENT SIGNATURE_________________________________  NURSE SIGNATURE__________________________________  ________________________________________________________________________   Beverly [name]  An incentive spirometer is a tool that can help keep your lungs clear and active. This tool measures how well you are filling your lungs with each breath. Taking long deep breaths may help reverse or decrease the chance of developing breathing (pulmonary) problems (especially infection) following:  A long period of time when you are unable to move or be active. BEFORE THE PROCEDURE   If the spirometer includes an indicator to show your best effort, your nurse or respiratory therapist will  set it to a desired goal.  If possible, sit up straight or lean slightly forward. Try not to slouch.  Hold the incentive spirometer in an upright position. INSTRUCTIONS FOR USE  1. Sit on the edge of your bed if possible, or sit up as far as you can in bed or on a chair. 2. Hold the incentive spirometer in an upright position. 3. Breathe out normally. 4. Place the mouthpiece in your mouth and seal your lips tightly around it. 5. Breathe in slowly and as deeply as possible, raising the piston or the ball toward the top of the column. 6. Hold your breath for 3-5 seconds or for as long as possible. Allow the piston or ball to fall to the bottom of the column. 7. Remove the mouthpiece from your mouth and breathe out normally. 8. Rest for  a few seconds and repeat Steps 1 through 7 at least 10 times every 1-2 hours when you are awake. Take your time and take a few normal breaths between deep breaths. 9. The spirometer may include an indicator to show your best effort. Use the indicator as a goal to work toward during each repetition. 10. After each set of 10 deep breaths, practice coughing to be sure your lungs are clear. If you have an incision (the cut made at the time of surgery), support your incision when coughing by placing a pillow or rolled up towels firmly against it. Once you are able to get out of bed, walk around indoors and cough well. You may stop using the incentive spirometer when instructed by your caregiver.  RISKS AND COMPLICATIONS  Take your time so you do not get dizzy or light-headed.  If you are in pain, you may need to take or ask for pain medication before doing incentive spirometry. It is harder to take a deep breath if you are having pain. AFTER USE  Rest and breathe slowly and easily.  It can be helpful to keep track of a log of your progress. Your caregiver can provide you with a simple table to help with this. If you are using the spirometer at home, follow these  instructions: [place] IF:   You are having difficultly using the spirometer.  You have trouble using the spirometer as often as instructed.  Your pain medication is not giving enough relief while using the spirometer.  You develop fever of 100.5 F (38.1 C) or higher. SEEK IMMEDIATE MEDICAL CARE IF:   You cough up bloody sputum that had not been present before.  You develop fever of 102 F (38.9 C) or greater.  You develop worsening pain at or near the incision site. MAKE SURE YOU:   Understand these instructions.  Will watch your condition.  Will get help right away if you are not doing well or get worse. Document Released: 09/30/2006 Document Revised: 08/12/2011 Document Reviewed: 12/01/2006 [facility] Patient Information 2014 [place], [place].   ________________________________________________________________________

## 2019-01-26 ENCOUNTER — Other Ambulatory Visit: Payer: Self-pay

## 2019-01-26 ENCOUNTER — Encounter (HOSPITAL_COMMUNITY)
Admission: RE | Admit: 2019-01-26 | Discharge: 2019-01-26 | Disposition: A | Discharge status: Home / Self Care | Payer: Medicare Other | Source: Ambulatory Visit | Attending: Orthopaedic Surgery | Admitting: Orthopaedic Surgery

## 2019-01-26 ENCOUNTER — Encounter (HOSPITAL_COMMUNITY): Payer: Self-pay

## 2019-01-26 ENCOUNTER — Other Ambulatory Visit (HOSPITAL_COMMUNITY)
Admission: RE | Admit: 2019-01-26 | Discharge: 2019-01-26 | Disposition: A | Discharge status: Home / Self Care | Payer: Medicare Other | Source: Ambulatory Visit | Attending: Orthopaedic Surgery | Admitting: Orthopaedic Surgery

## 2019-01-26 ENCOUNTER — Telehealth: Payer: Self-pay | Admitting: Orthopaedic Surgery

## 2019-01-26 DIAGNOSIS — Z881 Allergy status to other antibiotic agents status: Secondary | ICD-10-CM | POA: Diagnosis not present

## 2019-01-26 DIAGNOSIS — Z888 Allergy status to other drugs, medicaments and biological substances status: Secondary | ICD-10-CM | POA: Diagnosis not present

## 2019-01-26 DIAGNOSIS — Z87891 Personal history of nicotine dependence: Secondary | ICD-10-CM | POA: Diagnosis not present

## 2019-01-26 DIAGNOSIS — Z882 Allergy status to sulfonamides status: Secondary | ICD-10-CM | POA: Diagnosis not present

## 2019-01-26 DIAGNOSIS — M170 Bilateral primary osteoarthritis of knee: Secondary | ICD-10-CM | POA: Diagnosis not present

## 2019-01-26 DIAGNOSIS — M1611 Unilateral primary osteoarthritis, right hip: Secondary | ICD-10-CM | POA: Insufficient documentation

## 2019-01-26 DIAGNOSIS — M1612 Unilateral primary osteoarthritis, left hip: Secondary | ICD-10-CM | POA: Diagnosis not present

## 2019-01-26 DIAGNOSIS — K219 Gastro-esophageal reflux disease without esophagitis: Secondary | ICD-10-CM | POA: Diagnosis not present

## 2019-01-26 DIAGNOSIS — M25752 Osteophyte, left hip: Secondary | ICD-10-CM | POA: Diagnosis not present

## 2019-01-26 DIAGNOSIS — Z01812 Encounter for preprocedural laboratory examination: Secondary | ICD-10-CM | POA: Insufficient documentation

## 2019-01-26 DIAGNOSIS — Z20828 Contact with and (suspected) exposure to other viral communicable diseases: Secondary | ICD-10-CM | POA: Insufficient documentation

## 2019-01-26 HISTORY — DX: Unspecified osteoarthritis, unspecified site: M19.90

## 2019-01-26 HISTORY — DX: Other specified postprocedural states: R11.2

## 2019-01-26 HISTORY — DX: Pneumonia, unspecified organism: J18.9

## 2019-01-26 HISTORY — DX: Dyskinesia of esophagus: K22.4

## 2019-01-26 HISTORY — DX: Other specified postprocedural states: Z98.890

## 2019-01-26 LAB — CBC
HCT: 45.6 % (ref 36.0–46.0)
Hemoglobin: 14.9 g/dL (ref 12.0–15.0)
MCH: 29.3 pg (ref 26.0–34.0)
MCHC: 32.7 g/dL (ref 30.0–36.0)
MCV: 89.6 fL (ref 80.0–100.0)
Platelets: 227 10*3/uL (ref 150–400)
RBC: 5.09 MIL/uL (ref 3.87–5.11)
RDW: 13 % (ref 11.5–15.5)
WBC: 6.7 10*3/uL (ref 4.0–10.5)
nRBC: 0 % (ref 0.0–0.2)

## 2019-01-26 LAB — SURGICAL PCR SCREEN
MRSA, PCR: NEGATIVE
Staphylococcus aureus: NEGATIVE

## 2019-01-26 LAB — SARS CORONAVIRUS 2 (TAT 6-24 HRS): SARS Coronavirus 2: NEGATIVE

## 2019-01-26 NOTE — Progress Notes (Signed)
SPOKE W/  Roxene     SCREENING SYMPTOMS OF COVID 19:   COUGH--NO  RUNNY NOSE--- NO  SORE THROAT---NO  NASAL CONGESTION----NO  SNEEZING----NO  SHORTNESS OF BREATH---NO  DIFFICULTY BREATHING---NO  TEMP >100.0 -----NO  UNEXPLAINED BODY ACHES------NO  CHILLS -------- NO  HEADACHES ---------NO  LOSS OF SMELL/ TASTE --------NO    HAVE YOU OR ANY FAMILY MEMBER TRAVELLED PAST 14 DAYS OUT OF THE   COUNTY---NO STATE----NO COUNTRY----NO  HAVE YOU OR ANY FAMILY MEMBER BEEN EXPOSED TO ANYONE WITH COVID 19? NO    

## 2019-01-26 NOTE — Telephone Encounter (Signed)
Giving to you

## 2019-01-26 NOTE — Telephone Encounter (Signed)
I agree with switching the patient surgery this Friday to performing a left total hip arthroplasty instead of a right total hip arthroplasty.  She does have a significant arthritis in both hips verified on plain films and MRI.  She says her left one is bothering her worse now.  She is going to eventually need both her hips replaced but we will start with the left hip instead first this Friday.  I will make an addendum to her note.  Can you make the rest of it happen in terms of switching her to a left hip and let her know that we have done that.  Thank you very much for all that you do!

## 2019-01-26 NOTE — Telephone Encounter (Signed)
Patient left a voicemail wanting to discuss with Dr. Ninfa Linden or Artis Delay the possibility of changing her surgery from the right hip to the left one because she is having more trouble with the left hip.  [phone].  Thank you.

## 2019-01-26 NOTE — Telephone Encounter (Signed)
I called patient and advised would change to left.

## 2019-01-28 ENCOUNTER — Telehealth: Payer: Self-pay | Admitting: *Deleted

## 2019-01-28 NOTE — Telephone Encounter (Signed)
Ortho bundle pre-op call completed. 

## 2019-01-28 NOTE — Care Plan (Signed)
RNCM made call to patient to discuss upcoming Left Total hip replacement surgery on 01/29/19 with Dr. Ninfa Linden. Reviewed Ortho Bundle program. Answered all pre-op questions and reviewed post-op instructions. Patient has family that will be able to assist at home at discharge. Has all DME needed post-operatively (FWW, 3in1). Anticipate home health Physical Therapy at discharge. Referral will be made to [facility] (choice provided). Reviewed f/u appointment scheduled with Dr. Ninfa Linden after surgery on 02/11/19 at 2:30 pm. Provided RNCM contact information. Will continue to follow for further CM needs.

## 2019-01-29 ENCOUNTER — Encounter (HOSPITAL_COMMUNITY)
Admission: RE | Disposition: A | Discharge status: Home Health | Payer: Self-pay | Source: Other Acute Inpatient Hospital | Attending: Orthopaedic Surgery

## 2019-01-29 ENCOUNTER — Ambulatory Visit (HOSPITAL_COMMUNITY): Payer: Medicare Other

## 2019-01-29 ENCOUNTER — Other Ambulatory Visit: Payer: Self-pay

## 2019-01-29 ENCOUNTER — Observation Stay (HOSPITAL_COMMUNITY): Payer: Medicare Other

## 2019-01-29 ENCOUNTER — Observation Stay (HOSPITAL_COMMUNITY)
Admission: RE | Admit: 2019-01-29 | Discharge: 2019-01-30 | Disposition: A | Discharge status: Home Health | Payer: Medicare Other | Source: Other Acute Inpatient Hospital | Attending: Orthopaedic Surgery | Admitting: Orthopaedic Surgery

## 2019-01-29 ENCOUNTER — Ambulatory Visit (HOSPITAL_COMMUNITY): Payer: Medicare Other | Admitting: Certified Registered"

## 2019-01-29 ENCOUNTER — Encounter (HOSPITAL_COMMUNITY): Payer: Self-pay | Admitting: *Deleted

## 2019-01-29 ENCOUNTER — Ambulatory Visit (HOSPITAL_COMMUNITY): Payer: Medicare Other | Admitting: Physician Assistant

## 2019-01-29 DIAGNOSIS — M1612 Unilateral primary osteoarthritis, left hip: Principal | ICD-10-CM | POA: Diagnosis present

## 2019-01-29 DIAGNOSIS — Z419 Encounter for procedure for purposes other than remedying health state, unspecified: Secondary | ICD-10-CM

## 2019-01-29 DIAGNOSIS — Z20828 Contact with and (suspected) exposure to other viral communicable diseases: Secondary | ICD-10-CM | POA: Insufficient documentation

## 2019-01-29 DIAGNOSIS — Z881 Allergy status to other antibiotic agents status: Secondary | ICD-10-CM | POA: Diagnosis not present

## 2019-01-29 DIAGNOSIS — Z882 Allergy status to sulfonamides status: Secondary | ICD-10-CM | POA: Insufficient documentation

## 2019-01-29 DIAGNOSIS — M7061 Trochanteric bursitis, right hip: Secondary | ICD-10-CM | POA: Diagnosis not present

## 2019-01-29 DIAGNOSIS — M25752 Osteophyte, left hip: Secondary | ICD-10-CM | POA: Insufficient documentation

## 2019-01-29 DIAGNOSIS — Z96642 Presence of left artificial hip joint: Secondary | ICD-10-CM | POA: Diagnosis not present

## 2019-01-29 DIAGNOSIS — N840 Polyp of corpus uteri: Secondary | ICD-10-CM | POA: Diagnosis not present

## 2019-01-29 DIAGNOSIS — Z87891 Personal history of nicotine dependence: Secondary | ICD-10-CM | POA: Diagnosis not present

## 2019-01-29 DIAGNOSIS — M170 Bilateral primary osteoarthritis of knee: Secondary | ICD-10-CM | POA: Insufficient documentation

## 2019-01-29 DIAGNOSIS — Z471 Aftercare following joint replacement surgery: Secondary | ICD-10-CM | POA: Diagnosis not present

## 2019-01-29 DIAGNOSIS — J310 Chronic rhinitis: Secondary | ICD-10-CM | POA: Diagnosis not present

## 2019-01-29 DIAGNOSIS — Z888 Allergy status to other drugs, medicaments and biological substances status: Secondary | ICD-10-CM | POA: Diagnosis not present

## 2019-01-29 DIAGNOSIS — K219 Gastro-esophageal reflux disease without esophagitis: Secondary | ICD-10-CM | POA: Insufficient documentation

## 2019-01-29 HISTORY — PX: TOTAL HIP ARTHROPLASTY: SHX124

## 2019-01-29 SURGERY — ARTHROPLASTY, HIP, TOTAL, ANTERIOR APPROACH
Anesthesia: Spinal | Laterality: Left

## 2019-01-29 MED ORDER — DEXAMETHASONE SODIUM PHOSPHATE 10 MG/ML IJ SOLN
INTRAMUSCULAR | Status: AC
  Filled 2019-01-29: qty 2

## 2019-01-29 MED ORDER — LYSINE 500 MG PO CAPS: 500.0000 mg | ORAL_CAPSULE | Freq: Every day | ORAL | Status: DC

## 2019-01-29 MED ORDER — ONDANSETRON HCL 4 MG/2ML IJ SOLN: 4.0000 mg | Freq: Four times a day (QID) | INTRAMUSCULAR | Status: DC | PRN

## 2019-01-29 MED ORDER — ADULT MULTIVITAMIN W/MINERALS CH
1.0000 | ORAL_TABLET | Freq: Every day | ORAL | Status: DC
  Administered 2019-01-30: 1 via ORAL
  Filled 2019-01-29: qty 1

## 2019-01-29 MED ORDER — LIDOCAINE 2% (20 MG/ML) 5 ML SYRINGE
INTRAMUSCULAR | Status: AC
  Filled 2019-01-29: qty 10

## 2019-01-29 MED ORDER — PROPOFOL 10 MG/ML IV BOLUS
INTRAVENOUS | Status: AC
  Filled 2019-01-29: qty 120

## 2019-01-29 MED ORDER — CEFAZOLIN SODIUM-DEXTROSE 1-4 GM/50ML-% IV SOLN
1.0000 g | Freq: Four times a day (QID) | INTRAVENOUS | Status: AC
  Administered 2019-01-29 (×2): 1 g via INTRAVENOUS
  Filled 2019-01-29 (×2): qty 50

## 2019-01-29 MED ORDER — GABAPENTIN 100 MG PO CAPS
100.0000 mg | ORAL_CAPSULE | Freq: Three times a day (TID) | ORAL | Status: DC
  Administered 2019-01-29 – 2019-01-30 (×3): 100 mg via ORAL
  Filled 2019-01-29 (×3): qty 1

## 2019-01-29 MED ORDER — PHENYLEPHRINE HCL (PRESSORS) 10 MG/ML IV SOLN
INTRAVENOUS | Status: AC
  Filled 2019-01-29: qty 2

## 2019-01-29 MED ORDER — LACTATED RINGERS IV SOLN: INTRAVENOUS | Status: DC

## 2019-01-29 MED ORDER — METOCLOPRAMIDE HCL 5 MG PO TABS: 5.0000 mg | ORAL_TABLET | Freq: Three times a day (TID) | ORAL | Status: DC | PRN

## 2019-01-29 MED ORDER — DIPHENHYDRAMINE HCL 12.5 MG/5ML PO ELIX: 12.5000 mg | ORAL_SOLUTION | ORAL | Status: DC | PRN

## 2019-01-29 MED ORDER — ALUM & MAG HYDROXIDE-SIMETH 200-200-20 MG/5ML PO SUSP: 30.0000 mL | ORAL | Status: DC | PRN

## 2019-01-29 MED ORDER — POVIDONE-IODINE 10 % EX SWAB: 2.0000 "application " | Freq: Once | CUTANEOUS | Status: DC

## 2019-01-29 MED ORDER — OXYCODONE HCL 5 MG PO TABS
5.0000 mg | ORAL_TABLET | ORAL | Status: DC | PRN
  Administered 2019-01-29 – 2019-01-30 (×3): 10 mg via ORAL
  Filled 2019-01-29 (×3): qty 2

## 2019-01-29 MED ORDER — SODIUM CHLORIDE 0.9 % IR SOLN
Status: DC | PRN
  Administered 2019-01-29 (×2): 1000 mL

## 2019-01-29 MED ORDER — OXYCODONE HCL 5 MG PO TABS
10.0000 mg | ORAL_TABLET | ORAL | Status: DC | PRN
  Administered 2019-01-29: 15 mg via ORAL
  Filled 2019-01-29: qty 3

## 2019-01-29 MED ORDER — PROPOFOL 10 MG/ML IV BOLUS
INTRAVENOUS | Status: AC
  Filled 2019-01-29: qty 20

## 2019-01-29 MED ORDER — BUPIVACAINE IN DEXTROSE 0.75-8.25 % IT SOLN
INTRATHECAL | Status: DC | PRN
  Administered 2019-01-29: 1.8 mL via INTRATHECAL

## 2019-01-29 MED ORDER — KETOROLAC TROMETHAMINE 15 MG/ML IJ SOLN
7.5000 mg | Freq: Four times a day (QID) | INTRAMUSCULAR | Status: DC
  Administered 2019-01-29 – 2019-01-30 (×3): 7.5 mg via INTRAVENOUS
  Filled 2019-01-29 (×3): qty 1

## 2019-01-29 MED ORDER — SODIUM CHLORIDE 0.9 % IV SOLN
INTRAVENOUS | Status: DC | PRN
  Administered 2019-01-29: 20 ug/min via INTRAVENOUS

## 2019-01-29 MED ORDER — CHLORHEXIDINE GLUCONATE 4 % EX LIQD: 60.0000 mL | Freq: Once | CUTANEOUS | Status: DC

## 2019-01-29 MED ORDER — HYDROMORPHONE HCL 1 MG/ML IJ SOLN
0.5000 mg | INTRAMUSCULAR | Status: DC | PRN
  Administered 2019-01-29: 0.5 mg via INTRAVENOUS
  Filled 2019-01-29 (×2): qty 1

## 2019-01-29 MED ORDER — ACETAMINOPHEN 325 MG PO TABS: 325.0000 mg | ORAL_TABLET | Freq: Once | ORAL | Status: DC | PRN

## 2019-01-29 MED ORDER — ACETAMINOPHEN 10 MG/ML IV SOLN
INTRAVENOUS | Status: AC
  Filled 2019-01-29: qty 100

## 2019-01-29 MED ORDER — MIDAZOLAM HCL 5 MG/5ML IJ SOLN
INTRAMUSCULAR | Status: DC | PRN
  Administered 2019-01-29: 2 mg via INTRAVENOUS

## 2019-01-29 MED ORDER — ONDANSETRON HCL 4 MG/2ML IJ SOLN
INTRAMUSCULAR | Status: DC | PRN
  Administered 2019-01-29: 4 mg via INTRAVENOUS

## 2019-01-29 MED ORDER — SUCCINYLCHOLINE CHLORIDE 200 MG/10ML IV SOSY
PREFILLED_SYRINGE | INTRAVENOUS | Status: AC
  Filled 2019-01-29: qty 10

## 2019-01-29 MED ORDER — MEPERIDINE HCL 50 MG/ML IJ SOLN: 6.2500 mg | INTRAMUSCULAR | Status: DC | PRN

## 2019-01-29 MED ORDER — SODIUM CHLORIDE 0.9 % IV SOLN
INTRAVENOUS | Status: DC
  Administered 2019-01-29 (×2): via INTRAVENOUS

## 2019-01-29 MED ORDER — METHOCARBAMOL 500 MG IVPB - SIMPLE MED
500.0000 mg | Freq: Four times a day (QID) | INTRAVENOUS | Status: DC | PRN
  Administered 2019-01-29: 500 mg via INTRAVENOUS
  Filled 2019-01-29: qty 50

## 2019-01-29 MED ORDER — METHOCARBAMOL 500 MG PO TABS
500.0000 mg | ORAL_TABLET | Freq: Four times a day (QID) | ORAL | Status: DC | PRN
  Administered 2019-01-29 – 2019-01-30 (×2): 500 mg via ORAL
  Filled 2019-01-29 (×3): qty 1

## 2019-01-29 MED ORDER — METOCLOPRAMIDE HCL 5 MG/ML IJ SOLN: 5.0000 mg | Freq: Three times a day (TID) | INTRAMUSCULAR | Status: DC | PRN

## 2019-01-29 MED ORDER — HYDROMORPHONE HCL 1 MG/ML IJ SOLN: 0.2500 mg | INTRAMUSCULAR | Status: DC | PRN

## 2019-01-29 MED ORDER — LACTATED RINGERS IV SOLN
INTRAVENOUS | Status: DC
  Administered 2019-01-29 (×2): via INTRAVENOUS

## 2019-01-29 MED ORDER — ACETAMINOPHEN 10 MG/ML IV SOLN: 1000.0000 mg | Freq: Once | INTRAVENOUS | Status: DC | PRN

## 2019-01-29 MED ORDER — POLYETHYLENE GLYCOL 3350 17 G PO PACK: 17.0000 g | PACK | Freq: Every day | ORAL | Status: DC | PRN

## 2019-01-29 MED ORDER — METHOCARBAMOL 500 MG IVPB - SIMPLE MED
INTRAVENOUS | Status: AC
  Filled 2019-01-29: qty 50

## 2019-01-29 MED ORDER — VITAMIN B-12 1000 MCG PO TABS
2500.0000 ug | ORAL_TABLET | Freq: Every day | ORAL | Status: DC
  Administered 2019-01-30: 2500 ug via ORAL
  Filled 2019-01-29: qty 3

## 2019-01-29 MED ORDER — PANTOPRAZOLE SODIUM 40 MG PO TBEC
40.0000 mg | DELAYED_RELEASE_TABLET | Freq: Every day | ORAL | Status: DC
  Administered 2019-01-29: 40 mg via ORAL
  Filled 2019-01-29 (×2): qty 1

## 2019-01-29 MED ORDER — MIDAZOLAM HCL 2 MG/2ML IJ SOLN
INTRAMUSCULAR | Status: AC
  Filled 2019-01-29: qty 2

## 2019-01-29 MED ORDER — PROPOFOL 10 MG/ML IV BOLUS
INTRAVENOUS | Status: DC | PRN
  Administered 2019-01-29: 30 mg via INTRAVENOUS

## 2019-01-29 MED ORDER — VITAMIN B-6 100 MG PO TABS
100.0000 mg | ORAL_TABLET | Freq: Every day | ORAL | Status: DC
  Filled 2019-01-29 (×2): qty 1

## 2019-01-29 MED ORDER — VITAMIN C 500 MG PO TABS
1000.0000 mg | ORAL_TABLET | Freq: Every day | ORAL | Status: DC
  Administered 2019-01-30: 1000 mg via ORAL
  Filled 2019-01-29: qty 2

## 2019-01-29 MED ORDER — DOCUSATE SODIUM 100 MG PO CAPS
100.0000 mg | ORAL_CAPSULE | Freq: Two times a day (BID) | ORAL | Status: DC
  Administered 2019-01-29 – 2019-01-30 (×2): 100 mg via ORAL
  Filled 2019-01-29 (×2): qty 1

## 2019-01-29 MED ORDER — ASPIRIN 81 MG PO CHEW
81.0000 mg | CHEWABLE_TABLET | Freq: Two times a day (BID) | ORAL | Status: DC
  Administered 2019-01-29 – 2019-01-30 (×2): 81 mg via ORAL
  Filled 2019-01-29 (×2): qty 1

## 2019-01-29 MED ORDER — PHENOL 1.4 % MT LIQD: 1.0000 | OROMUCOSAL | Status: DC | PRN

## 2019-01-29 MED ORDER — ACETAMINOPHEN 160 MG/5ML PO SOLN: 325.0000 mg | Freq: Once | ORAL | Status: DC | PRN

## 2019-01-29 MED ORDER — DEXAMETHASONE SODIUM PHOSPHATE 10 MG/ML IJ SOLN
INTRAMUSCULAR | Status: DC | PRN
  Administered 2019-01-29: 10 mg via INTRAVENOUS

## 2019-01-29 MED ORDER — CEFAZOLIN SODIUM-DEXTROSE 2-4 GM/100ML-% IV SOLN
2.0000 g | INTRAVENOUS | Status: AC
  Administered 2019-01-29: 2 g via INTRAVENOUS

## 2019-01-29 MED ORDER — TRANEXAMIC ACID-NACL 1000-0.7 MG/100ML-% IV SOLN
1000.0000 mg | INTRAVENOUS | Status: AC
  Administered 2019-01-29: 1000 mg via INTRAVENOUS

## 2019-01-29 MED ORDER — ONDANSETRON HCL 4 MG/2ML IJ SOLN
INTRAMUSCULAR | Status: AC
  Filled 2019-01-29: qty 4

## 2019-01-29 MED ORDER — ONDANSETRON HCL 4 MG PO TABS: 4.0000 mg | ORAL_TABLET | Freq: Four times a day (QID) | ORAL | Status: DC | PRN

## 2019-01-29 MED ORDER — ACETAMINOPHEN 325 MG PO TABS: 325.0000 mg | ORAL_TABLET | Freq: Four times a day (QID) | ORAL | Status: DC | PRN

## 2019-01-29 MED ORDER — TRANEXAMIC ACID-NACL 1000-0.7 MG/100ML-% IV SOLN
INTRAVENOUS | Status: AC
  Filled 2019-01-29: qty 100

## 2019-01-29 MED ORDER — MENTHOL 3 MG MT LOZG: 1.0000 | LOZENGE | OROMUCOSAL | Status: DC | PRN

## 2019-01-29 MED ORDER — PROPOFOL 500 MG/50ML IV EMUL
INTRAVENOUS | Status: DC | PRN
  Administered 2019-01-29: 25 ug/kg/min via INTRAVENOUS

## 2019-01-29 MED ORDER — CEFAZOLIN SODIUM-DEXTROSE 2-4 GM/100ML-% IV SOLN
INTRAVENOUS | Status: AC
  Filled 2019-01-29: qty 100

## 2019-01-29 MED ORDER — PROMETHAZINE HCL 25 MG/ML IJ SOLN: 6.2500 mg | INTRAMUSCULAR | Status: DC | PRN

## 2019-01-29 MED ORDER — ACETAMINOPHEN 10 MG/ML IV SOLN
INTRAVENOUS | Status: DC | PRN
  Administered 2019-01-29: 1000 mg via INTRAVENOUS

## 2019-01-29 MED ORDER — BIOTIN 5000 MCG PO TABS: 5000.0000 ug | ORAL_TABLET | Freq: Every day | ORAL | Status: DC

## 2019-01-29 MED ORDER — ZOLPIDEM TARTRATE 5 MG PO TABS: 5.0000 mg | ORAL_TABLET | Freq: Every evening | ORAL | Status: DC | PRN

## 2019-01-29 SURGICAL SUPPLY — 41 items
BAG ZIPLOCK 12X15 (MISCELLANEOUS) IMPLANT
BENZOIN TINCTURE PRP APPL 2/3 (GAUZE/BANDAGES/DRESSINGS) ×1 IMPLANT
BLADE SAW SGTL 18X1.27X75 (BLADE) ×2 IMPLANT
BLADE SURG SZ10 CARB STEEL (BLADE) ×4 IMPLANT
COVER PERINEAL POST (MISCELLANEOUS) ×2 IMPLANT
COVER SURGICAL LIGHT HANDLE (MISCELLANEOUS) ×2 IMPLANT
COVER WAND RF STERILE (DRAPES) IMPLANT
CUP SECTOR GRIPTON 50MM (Cup) ×1 IMPLANT
DRAPE STERI IOBAN 125X83 (DRAPES) ×2 IMPLANT
DRAPE U-SHAPE 47X51 STRL (DRAPES) ×4 IMPLANT
DRESSING AQUACEL AG SP 3.5X10 (GAUZE/BANDAGES/DRESSINGS) IMPLANT
DRSG AQUACEL AG ADV 3.5X10 (GAUZE/BANDAGES/DRESSINGS) ×2 IMPLANT
DRSG AQUACEL AG SP 3.5X10 (GAUZE/BANDAGES/DRESSINGS) ×2
DURAPREP 26ML APPLICATOR (WOUND CARE) ×2 IMPLANT
ELECT REM PT RETURN 15FT ADLT (MISCELLANEOUS) ×2 IMPLANT
GAUZE XEROFORM 1X8 LF (GAUZE/BANDAGES/DRESSINGS) ×2 IMPLANT
GLOVE BIO SURGEON STRL SZ7.5 (GLOVE) ×2 IMPLANT
GLOVE BIOGEL PI IND STRL 8 (GLOVE) ×2 IMPLANT
GLOVE BIOGEL PI INDICATOR 8 (GLOVE) ×2
GLOVE ECLIPSE 8.0 STRL XLNG CF (GLOVE) ×2 IMPLANT
GOWN STRL REUS W/TWL XL LVL3 (GOWN DISPOSABLE) ×4 IMPLANT
HANDPIECE INTERPULSE COAX TIP (DISPOSABLE) ×1
HEAD FEM STD 32X+1 STRL (Hips) ×1 IMPLANT
HOLDER FOLEY CATH W/STRAP (MISCELLANEOUS) ×2 IMPLANT
KIT TURNOVER KIT A (KITS) IMPLANT
LINER ACETABULAR 32X50 (Liner) ×1 IMPLANT
PACK ANTERIOR HIP CUSTOM (KITS) ×2 IMPLANT
SET HNDPC FAN SPRY TIP SCT (DISPOSABLE) ×1 IMPLANT
STAPLER VISISTAT 35W (STAPLE) IMPLANT
STEM FEM SZ3 STD ACTIS (Stem) ×1 IMPLANT
STRIP CLOSURE SKIN 1/2X4 (GAUZE/BANDAGES/DRESSINGS) ×1 IMPLANT
SUT ETHIBOND NAB CT1 #1 30IN (SUTURE) ×2 IMPLANT
SUT ETHILON 2 0 PS N (SUTURE) IMPLANT
SUT MNCRL AB 4-0 PS2 18 (SUTURE) ×1 IMPLANT
SUT VIC AB 0 CT1 36 (SUTURE) ×2 IMPLANT
SUT VIC AB 1 CT1 36 (SUTURE) ×2 IMPLANT
SUT VIC AB 2-0 CT1 27 (SUTURE) ×2
SUT VIC AB 2-0 CT1 TAPERPNT 27 (SUTURE) ×2 IMPLANT
TRAY FOLEY MTR SLVR 14FR STAT (SET/KITS/TRAYS/PACK) ×1 IMPLANT
TRAY FOLEY MTR SLVR 16FR STAT (SET/KITS/TRAYS/PACK) ×1 IMPLANT
YANKAUER SUCT BULB TIP 10FT TU (MISCELLANEOUS) ×2 IMPLANT

## 2019-01-29 NOTE — Anesthesia Preprocedure Evaluation (Addendum)
Anesthesia Evaluation  Patient identified by MRN, date of birth, ID band Patient awake    Reviewed: Allergy & Precautions, NPO status , Patient's Chart, lab work & pertinent test results  History of Anesthesia Complications (+) PONV  Airway Mallampati: I  TM Distance: >3 FB Neck ROM: Full    Dental  (+) Teeth Intact, Dental Advisory Given   Pulmonary former smoker,    breath sounds clear to auscultation       Cardiovascular negative cardio ROS   Rhythm:Regular Rate:Normal     Neuro/Psych negative neurological ROS  negative psych ROS   GI/Hepatic Neg liver ROS, GERD  ,  Endo/Other  negative endocrine ROS  Renal/GU negative Renal ROS     Musculoskeletal  (+) Arthritis ,   Abdominal Normal abdominal exam  (+)   Peds  Hematology negative hematology ROS (+)   Anesthesia Other Findings   Reproductive/Obstetrics                            Anesthesia Physical Anesthesia Plan  ASA: II  Anesthesia Plan: Spinal   Post-op Pain Management:    Induction: Intravenous  PONV Risk Score and Plan: 4 or greater and Ondansetron, Dexamethasone, Propofol infusion and Treatment may vary due to age or medical condition  Airway Management Planned: Natural Airway and Simple Face Mask  Additional Equipment: None  Intra-op Plan:   Post-operative Plan:   Informed Consent: I have reviewed the patients History and Physical, chart, labs and discussed the procedure including the risks, benefits and alternatives for the proposed anesthesia with the patient or authorized representative who has indicated his/her understanding and acceptance.       Plan Discussed with: CRNA  Anesthesia Plan Comments:        Anesthesia Quick Evaluation

## 2019-01-29 NOTE — Anesthesia Postprocedure Evaluation (Signed)
Anesthesia Post Note  Patient: Beverly [name]  Procedure(s) Performed: LEFT TOTAL HIP ARTHROPLASTY ANTERIOR APPROACH (Left )     Patient location during evaluation: PACU Anesthesia Type: Spinal Level of consciousness: oriented and awake and alert Pain management: pain level controlled Vital Signs Assessment: post-procedure vital signs reviewed and stable Respiratory status: spontaneous breathing, respiratory function stable and patient connected to nasal cannula oxygen Cardiovascular status: blood pressure returned to baseline and stable Postop Assessment: no headache, no backache, no apparent nausea or vomiting and spinal receding Anesthetic complications: no    Last Vitals:  Vitals:   01/29/19 1351 01/29/19 1446  BP: 128/83 130/65  Pulse: 71 75  Resp: 16 16  Temp: 36.7 C 36.7 C  SpO2: 100% 100%                 Effie Berkshire

## 2019-01-29 NOTE — Brief Op Note (Signed)
01/29/2019  10:11 AM  PATIENT:  [name]  73 y.o. female  PRE-OPERATIVE DIAGNOSIS:  osteoarthritis left hip  POST-OPERATIVE DIAGNOSIS:  osteoarthritis left hip  PROCEDURE:  Procedure(s): LEFT TOTAL HIP ARTHROPLASTY ANTERIOR APPROACH (Left)  SURGEON:  Surgeon(s) and Role:    Mcarthur Rossetti, MD - Primary  PHYSICIAN ASSISTANT: Benita Stabile, PA-C  ANESTHESIA:   spinal  EBL:  100 mL   COUNTS:  YES  TOURNIQUET:  * No tourniquets in log *  DICTATION: .Other Dictation: Dictation Number [id number]  PLAN OF CARE: Admit to inpatient   PATIENT DISPOSITION:  PACU - hemodynamically stable.   Delay start of Pharmacological VTE agent (>24hrs) due to surgical blood loss or risk of bleeding: no

## 2019-01-29 NOTE — Care Plan (Signed)
Ortho Bundle Case Management Note  Patient Details  Name: Beverly [name] MRN: [id number] Date of Birth: Oct 04, 1945    [facility] made call to patient to discuss upcoming Left Total hip replacement surgery on 01/29/19 with Dr. Ninfa Linden. Reviewed Ortho Bundle program. Answered all pre-op questions and reviewed post-op instructions. Patient has family that will be able to assist at home at discharge. Has all DME needed post-operatively (FWW, 3in1). Anticipate home health Physical Therapy at discharge. Referral will be made to [facility] (choice provided). Reviewed f/u appointment scheduled with Dr. Ninfa Linden after surgery on 02/11/19 at 2:30 pm. Provided RNCM contact information. Will continue to follow for further CM needs.                        DME Arranged:  (No DME needed; Patient has FWW and 3in1) DME Agency:  (No DME needed)  HH Arranged:  PT [place]:  [facility] (now [facility])  Additional Comments: Please contact me with any questions of if this plan should need to change.  Jamse Arn, RN, BSN, [employer]  [phone] 01/29/2019, 8:28 AM

## 2019-01-29 NOTE — Transfer of Care (Signed)
Immediate Anesthesia Transfer of Care Note  Patient: Beverly [name]  Procedure(s) Performed: LEFT TOTAL HIP ARTHROPLASTY ANTERIOR APPROACH (Left )  Patient Location: PACU  Anesthesia Type:MAC and Spinal  Level of Consciousness: awake, oriented and patient cooperative  Airway & Oxygen Therapy: Patient Spontanous Breathing and Patient connected to face mask oxygen  Post-op Assessment: Report given to RN and Post -op Vital signs reviewed and stable  Post vital signs: Reviewed and stable  Last Vitals:  Vitals Value Taken Time  BP 97/69 01/29/19 1035  Temp    Pulse 61 01/29/19 1036  Resp 15 01/29/19 1036  SpO2 100 % 01/29/19 1036  Vitals shown include unvalidated device data.  Last Pain:  Vitals:   01/29/19 0716  TempSrc:   PainSc: 4       Patients Stated Pain Goal: 4 (84/85/92 7639)  Complications: No apparent anesthesia complications

## 2019-01-29 NOTE — Evaluation (Signed)
Physical Therapy Evaluation Patient Details Name: Beverly [name] MRN: [id number] DOB: 1945/12/02 Today's Date: 01/29/2019   History of Present Illness  Pt s/p L THR   Clinical Impression  Pt s/p L THR and presents with decreased L LE strength/ROM and post op pain limiting functional mobility.  Pt should progress to dc home with family assist.    Follow Up Recommendations Home health PT;Follow surgeon's recommendation for DC plan and follow-up therapies    Equipment Recommendations  None recommended by PT    Recommendations for Other Services       Precautions / Restrictions Precautions Precautions: Fall Restrictions Weight Bearing Restrictions: No Other Position/Activity Restrictions: WBAT      Mobility  Bed Mobility Overal bed mobility: Needs Assistance Bed Mobility: Supine to Sit     Supine to sit: Min assist     General bed mobility comments: cues for sequence and use of R LE to self assist  Transfers Overall transfer level: Needs assistance Equipment used: Rolling walker (2 wheeled) Transfers: Sit to/from Stand Sit to Stand: Min assist         General transfer comment: cues for LE management and use of UEs to self assist  Ambulation/Gait Ambulation/Gait assistance: Min assist Gait Distance (Feet): 52 Feet Assistive device: Rolling walker (2 wheeled) Gait Pattern/deviations: Step-to pattern;Decreased step length - right;Decreased step length - left;Shuffle;Trunk flexed Gait velocity: decr   General Gait Details: cues for sequence, posture and postion from [employer]            Wheelchair Mobility    Modified Rankin (Stroke Patients Only)       Balance Overall balance assessment: Mild deficits observed, not formally tested                                           Pertinent Vitals/Pain Pain Assessment: 0-10 Pain Score: 4  Pain Location: L hip/thigh Pain Descriptors / Indicators: Aching;Burning Pain Intervention(s):  Limited activity within patient's tolerance;Monitored during session;Premedicated before session;Ice applied    Home Living Family/patient expects to be discharged to:: Private residence Living Arrangements: Spouse/significant other Available Help at Discharge: Family Type of Home: House Home Access: Stairs to enter Entrance Stairs-Rails: None Entrance Stairs-Number of Steps: 2 Home Layout: One level Home Equipment: Environmental consultant - 2 wheels;Cane - single point;Bedside commode      Prior Function Level of Independence: Independent               Hand Dominance        Extremity/Trunk Assessment   Upper Extremity Assessment Upper Extremity Assessment: Overall WFL for tasks assessed    Lower Extremity Assessment Lower Extremity Assessment: LLE deficits/detail    Cervical / Trunk Assessment Cervical / Trunk Assessment: Normal  Communication   Communication: No difficulties  Cognition Arousal/Alertness: Awake/alert Behavior During Therapy: WFL for tasks assessed/performed Overall Cognitive Status: Within Functional Limits for tasks assessed                                        General Comments      Exercises Total Joint Exercises Ankle Circles/Pumps: AROM;Both;15 reps;Supine   Assessment/Plan    PT Assessment Patient needs continued PT services  PT Problem List Decreased strength;Decreased range of motion;Decreased activity tolerance;Decreased balance;Decreased mobility;Decreased knowledge of use of DME;Pain  PT Treatment Interventions DME instruction;Gait training;Stair training;Functional mobility training;Therapeutic activities;Therapeutic exercise;Patient/family education    PT Goals (Current goals can be found in the Care Plan section)  Acute Rehab PT Goals Patient Stated Goal: Regain IND PT Goal Formulation: With patient Time For Goal Achievement: 02/05/19 Potential to Achieve Goals: Good    Frequency 7X/week   Barriers to  discharge        Co-evaluation               AM-PAC PT "6 Clicks" Mobility  Outcome Measure Help needed turning from your back to your side while in a flat bed without using bedrails?: A Little Help needed moving from lying on your back to sitting on the side of a flat bed without using bedrails?: A Little Help needed moving to and from a bed to a chair (including a wheelchair)?: A Little Help needed standing up from a chair using your arms (e.g., wheelchair or bedside chair)?: A Little Help needed to walk in hospital room?: A Little Help needed climbing 3-5 steps with a railing? : A Lot 6 Click Score: 17    End of Session Equipment Utilized During Treatment: Gait belt Activity Tolerance: Patient tolerated treatment well Patient left: in chair;with call bell/phone within reach Nurse Communication: Mobility status PT Visit Diagnosis: Difficulty in walking, not elsewhere classified (R26.2)    Time: [id number] PT Time Calculation (min) (ACUTE ONLY): 36 min   Charges:   PT Evaluation $PT Eval Low Complexity: 1 Low PT Treatments $Gait Training: 8-22 mins        [place] Pager [phone] Office [phone]   Lou Irigoyen 01/29/2019, 6:42 PM

## 2019-01-29 NOTE — Anesthesia Procedure Notes (Signed)
Spinal  Start time: 01/29/2019 9:07 AM End time: 01/29/2019 9:09 AM Staffing Anesthesiologist: Effie Berkshire, MD Performed: anesthesiologist  Preanesthetic Checklist Completed: patient identified, site marked, surgical consent, pre-op evaluation, timeout performed, IV checked, risks and benefits discussed and monitors and equipment checked Spinal Block Patient position: sitting Prep: site prepped and draped and DuraPrep Location: L3-4 Injection technique: single-shot Needle Needle type: Pencan  Needle gauge: 24 G Needle length: 10 cm Needle insertion depth: 10 cm Additional Notes Patient tolerated well. No immediate complications.  CRNA attempted.

## 2019-01-29 NOTE — Op Note (Signed)
NAME: Beverly [name], Beverly [name] MEDICAL RECORD [id number] ACCOUNT [id number] DATE OF BIRTH:December 05, 1945 FACILITY: [facility] LOCATION: [id number] PHYSICIAN:Shandreka Dante Kerry [place], MD  OPERATIVE REPORT  DATE OF PROCEDURE:  01/29/2019  PREOPERATIVE DIAGNOSIS:  Primary osteoarthritis and degenerative joint disease, left hip.  POSTOPERATIVE DIAGNOSIS:  Primary osteoarthritis and degenerative joint disease, left hip.  PROCEDURE:  Left total hip arthroplasty through direct anterior approach.  IMPLANTS:  DePuy Sector Gription acetabular component size 50, size 32+0 neutral polyethylene liner, size 3 ACTIS femoral component with standard offset, size 32+1 metal hip ball.  SURGEON:  Lind Guest. Ninfa Linden, MD  ASSISTANT:  Benita Stabile, PA-C  ANESTHESIA:  Spinal.  ANTIBIOTICS:  Two grams IV Ancef.  ESTIMATED BLOOD LOSS:  100 mL.  COMPLICATIONS:  None.  INDICATIONS:  The patient is a 73 year old female with debilitating arthritis of both her hips.  Both of them are in need of hip replacement surgery.  We originally scheduled her for a right hip, but her left hip is starting to bother her so much that  she called earlier this week and said she would like to have her left hip replaced.  Again, both hips show severe arthritis, so we do feel comfortable proceeding with the left hip per her wishes today.  We had a long and thorough discussion about this.   We did mark her left hip and confirmed the consent form for left hip as well.  DESCRIPTION OF PROCEDURE:  After informed consent was obtained and her left hip was marked, she was brought to the operating room and sat up on a stretcher where spinal anesthesia was obtained.  She was then laid in supine position on a stretcher.  I was  able to assess her leg lengths and found them to be equal.  We then placed traction boots on both her feet, and I placed her supine on the Hana fracture table, the perineal post in place, and both legs in in-line skeletal traction device  and no traction  applied.  Her left operative hip was prepped and draped with DuraPrep and sterile drapes.  A time-out was called, and she was identified as correct patient and correct left hip.  We then made an incision just inferior and posterior to the anterior  superior iliac spine and carried this obliquely down the leg.  We dissected down tensor fascia lata muscle.  Tensor fascia was then divided longitudinally to proceed with direct anterior approach to the hip.  We identified and cauterized circumflex  vessels and identified the hip capsule, opened up the hip capsule in an L-type format finding a moderate joint effusion and significant periarticular osteophytes around the femoral head and neck.  We then placed Cobra retractors around the medial and  lateral femoral neck and made our femoral neck cut with an oscillating saw just proximal to the lesser trochanter.  I completed this with an osteotome and placed a corkscrew guide in the femoral head and removed the femoral head in its entirety and found  a wide area devoid of cartilage.  I then placed a bent Hohmann over the medial acetabular rim and removed remnants of the acetabular labrum and other debris.  I then began reaming under direct visualization from a size 44 reamer in stepwise increments  up to a size 49 with all reamers under direct visualization.  The last reamer was also placed under direct fluoroscopy so we could obtain our depth of reaming, our inclination and anteversion.  I then placed the real DePuy Sector Gription acetabular  component size 50 with the 32+0 neutral polyethylene liner.  Attention was then turned to the femur.  With the leg externally rotated to 120 degrees, extended and adducted, we were able to place a Mueller retractor medially and a Hohmann retractor behind  the greater trochanter.  We released the lateral joint capsule and used a box-cutting osteotome to enter the femoral canal and a rongeur to lateralize.  Then  began broaching using the ACTIS broaching system from the starter broach, going in stepwise  increments from a 0 to a 3.  With the 3 in place, we trialed a standard offset femoral neck and a 32+1 trial hip ball.  We brought the leg back over and up, and with traction and internal rotation reduced the pelvis, and we were pleased with leg length,  offset, range of motion, and stability.  We then dislocated the hip and removed the trial components.  We placed the real ACTIS standard offset femoral component size 3 with the real 32+1 metal hip ball.  Again reduced this in the acetabulum, and I  appreciated stability, leg length, range of motion and offset, verified clinically and radiographically.  We then irrigated the soft tissue with normal saline solution using pulsatile lavage.  We were able to close the joint capsule with interrupted #1  Ethibond suture.  We closed the tensor fascia with #1 Vicryl followed by closing the deep tissue with 0 Vicryl, closing the subcutaneous tissue with 2-0 Vicryl, and a 4-0 Monocryl suture was used to close the subcuticular tissue.  Steri-Strips and an  Aquacel dressing were applied.  She was taken off the Hana table and taken to recovery room in stable condition.  All final counts were correct.  There were no complications noted.  Of note, Benita Stabile, PA-C, assisted during the entire case.  His  assistance was crucial for facilitating all aspects of this case.  LN/NUANCE  D:01/29/2019 T:01/29/2019 JOB:[id number]

## 2019-01-29 NOTE — H&P (Signed)
TOTAL HIP ADMISSION H&P  Patient is admitted for left total hip arthroplasty.  Subjective:  Chief Complaint: left hip pain  HPI: Beverly [name], 73 y.o. female, has a history of pain and functional disability in the left hip(s) due to arthritis and patient has failed non-surgical conservative treatments for greater than 12 weeks to include NSAID's and/or analgesics, corticosteriod injections, flexibility and strengthening excercises, use of assistive devices and activity modification.  Onset of symptoms was gradual starting 3 years ago with gradually worsening course since that time.The patient noted no past surgery on the left hip(s).  Patient currently rates pain in the left hip at 10 out of 10 with activity. Patient has night pain, worsening of pain with activity and weight bearing, trendelenberg gait, pain that interfers with activities of daily living and pain with passive range of motion. Patient has evidence of subchondral cysts, subchondral sclerosis, periarticular osteophytes and joint space narrowing by imaging studies. This condition presents safety issues increasing the risk of falls.  There is no current active infection.  Patient Active Problem List   Diagnosis Date Noted  . Primary osteoarthritis of left hip 12/07/2018  . Primary osteoarthritis of right hip 12/07/2018  . Pain of right hip joint 05/21/2017  . Trochanteric bursitis, right hip 04/23/2017  . Pain in right hip 04/23/2017  . Hearing loss of right ear due to cerumen impaction 10/25/2016  . Chronic rhinitis 07/04/2015  . Cough 07/04/2015  . Endometrial polyp 01/11/2011   Past Medical History:  Diagnosis Date  . Basal cell carcinoma    mid back  . Esophageal dysmotility   . GERD (gastroesophageal reflux disease)    occassional  . OA (osteoarthritis)    hips and knees  . Pneumonia    history of   . PONV (postoperative nausea and vomiting)     Past Surgical History:  Procedure Laterality Date  . COLONOSCOPY     . FRACTURE SURGERY  2009   left ankle  . LAPAROSCOPY  1992   infection after tubal  . SHOULDER OPEN ROTATOR CUFF REPAIR Right 2009  . TUBAL LIGATION  1992    Current Facility-Administered Medications  Medication Dose Route Frequency Provider Last Rate Last Dose  . ceFAZolin (ANCEF) IVPB 2g/100 mL premix  2 g Intravenous On Call to OR Pete Pelt, PA-C      . chlorhexidine (HIBICLENS) 4 % liquid 4 application  60 mL Topical Once Erskine Emery W, PA-C      . povidone-iodine 10 % swab 2 application  2 application Topical Once Pete Pelt, PA-C      . tranexamic acid (CYKLOKAPRON) IVPB 1,000 mg  1,000 mg Intravenous To OR Pete Pelt, PA-C       Allergies  Allergen Reactions  . Macrodantin Hives  . Sulfa Antibiotics Palpitations    Social History   Tobacco Use  . Smoking status: Former Smoker    Quit date: 12/27/1979    Years since quitting: 39.1  . Smokeless tobacco: Never Used  Substance Use Topics  . Alcohol use: Yes    Alcohol/week: 3.0 standard drinks    Types: 3 Glasses of wine per week    Family History  Problem Relation Age of Onset  . Allergic rhinitis Neg Hx   . Angioedema Neg Hx   . Asthma Neg Hx   . Atopy Neg Hx   . Eczema Neg Hx   . Immunodeficiency Neg Hx   . Urticaria Neg Hx  Review of Systems  Musculoskeletal: Positive for joint pain.  All other systems reviewed and are negative.   Objective:  Physical Exam  Constitutional: She is oriented to person, place, and time. She appears well-developed and well-nourished.  HENT:  Head: Normocephalic and atraumatic.  Eyes: Pupils are equal, round, and reactive to light. EOM are normal.  Neck: Normal range of motion. Neck supple.  Cardiovascular: Normal rate.  Respiratory: Effort normal.  GI: Soft.  Musculoskeletal:     Left hip: She exhibits decreased range of motion, decreased strength, tenderness and bony tenderness.  Neurological: She is alert and oriented to person, place, and  time.  Skin: Skin is warm and dry.  Psychiatric: She has a normal mood and affect.    Vital signs in last 24 hours: Temp:  [98.2 F (36.8 C)] 98.2 F (36.8 C) (08/28 0652) Pulse Rate:  [85] 85 (08/28 0652) Resp:  [16] 16 (08/28 0652) BP: (140)/(83) 140/83 (08/28 0652) SpO2:  [100 %] 100 % (08/28 0652)  Labs:   Estimated body mass index is 20.83 kg/m as calculated from the following:   Height as of 01/26/19: 5\' 7"  (1.702 m).   Weight as of 01/26/19: 60.3 kg.   Imaging Review Plain radiographs demonstrate severe degenerative joint disease of the left hip(s). The bone quality appears to be good for age and reported activity level.      Assessment/Plan:  End stage arthritis, left hip(s)  The patient history, physical examination, clinical judgement of the provider and imaging studies are consistent with end stage degenerative joint disease of the left hip(s) and total hip arthroplasty is deemed medically necessary. The treatment options including medical management, injection therapy, arthroscopy and arthroplasty were discussed at length. The risks and benefits of total hip arthroplasty were presented and reviewed. The risks due to aseptic loosening, infection, stiffness, dislocation/subluxation,  thromboembolic complications and other imponderables were discussed.  The patient acknowledged the explanation, agreed to proceed with the plan and consent was signed. Patient is being admitted for inpatient treatment for surgery, pain control, PT, OT, prophylactic antibiotics, VTE prophylaxis, progressive ambulation and ADL's and discharge planning.The patient is planning to be discharged home with home health services

## 2019-01-30 DIAGNOSIS — M1612 Unilateral primary osteoarthritis, left hip: Secondary | ICD-10-CM | POA: Diagnosis not present

## 2019-01-30 DIAGNOSIS — Z20828 Contact with and (suspected) exposure to other viral communicable diseases: Secondary | ICD-10-CM | POA: Diagnosis not present

## 2019-01-30 DIAGNOSIS — Z881 Allergy status to other antibiotic agents status: Secondary | ICD-10-CM | POA: Diagnosis not present

## 2019-01-30 DIAGNOSIS — K219 Gastro-esophageal reflux disease without esophagitis: Secondary | ICD-10-CM | POA: Diagnosis not present

## 2019-01-30 DIAGNOSIS — Z87891 Personal history of nicotine dependence: Secondary | ICD-10-CM | POA: Diagnosis not present

## 2019-01-30 DIAGNOSIS — M25752 Osteophyte, left hip: Secondary | ICD-10-CM | POA: Diagnosis not present

## 2019-01-30 LAB — CBC
HCT: 32.8 % — ABNORMAL LOW (ref 36.0–46.0)
Hemoglobin: 10.7 g/dL — ABNORMAL LOW (ref 12.0–15.0)
MCH: 28.9 pg (ref 26.0–34.0)
MCHC: 32.6 g/dL (ref 30.0–36.0)
MCV: 88.6 fL (ref 80.0–100.0)
Platelets: 152 10*3/uL (ref 150–400)
RBC: 3.7 MIL/uL — ABNORMAL LOW (ref 3.87–5.11)
RDW: 12.7 % (ref 11.5–15.5)
WBC: 9.5 10*3/uL (ref 4.0–10.5)
nRBC: 0 % (ref 0.0–0.2)

## 2019-01-30 LAB — BASIC METABOLIC PANEL
Anion gap: 7 (ref 5–15)
BUN: 9 mg/dL (ref 8–23)
CO2: 22 mmol/L (ref 22–32)
Calcium: 8.1 mg/dL — ABNORMAL LOW (ref 8.9–10.3)
Chloride: 104 mmol/L (ref 98–111)
Creatinine, Ser: 0.56 mg/dL (ref 0.44–1.00)
GFR calc Af Amer: >60 mL/min (ref >60)
GFR calc non Af Amer: >60 mL/min (ref >60)
Glucose, Bld: 112 mg/dL — ABNORMAL HIGH (ref 70–99)
Potassium: 4.1 mmol/L (ref 3.5–5.1)
Sodium: 133 mmol/L — ABNORMAL LOW (ref 135–145)

## 2019-01-30 MED ORDER — METHOCARBAMOL 500 MG PO TABS: 500.0000 mg | ORAL_TABLET | Freq: Four times a day (QID) | ORAL | 1 refills | Status: DC | PRN

## 2019-01-30 MED ORDER — OXYCODONE HCL 5 MG PO TABS: 5.0000 mg | ORAL_TABLET | Freq: Four times a day (QID) | ORAL | 0 refills | Status: DC | PRN

## 2019-01-30 MED ORDER — ASPIRIN 81 MG PO CHEW: 81.0000 mg | CHEWABLE_TABLET | Freq: Two times a day (BID) | ORAL | 0 refills | Status: DC

## 2019-01-30 NOTE — Progress Notes (Signed)
Subjective: 1 Day Post-Op Procedure(s) (LRB): LEFT TOTAL HIP ARTHROPLASTY ANTERIOR APPROACH (Left) Patient reports pain as moderate.  Good mobility with therapy. Slight acute blood loss anemia from surgery, but tolerating well.  Vitals stable.  Objective: Vital signs in last 24 hours: Temp:  [97.4 F (36.3 C)-98.5 F (36.9 C)] 98.4 F (36.9 C) (08/29 0553) Pulse Rate:  [55-78] 78 (08/29 0553) Resp:  [11-20] 20 (08/29 0553) BP: (97-130)/(63-83) 115/63 (08/29 0553) SpO2:  [97 %-100 %] 100 % (08/29 0553)  Intake/Output from previous day: 08/28 0701 - 08/29 0700 In: 3214.7 [P.O.:480; I.V.:2334.7; IV Piggyback:400] Out: 3435 [Urine:3335; Blood:100] Intake/Output this shift: No intake/output data recorded.  Recent Labs    01/30/19 0235  HGB 10.7*   Recent Labs    01/30/19 0235  WBC 9.5  RBC 3.70*  HCT 32.8*  PLT 152   Recent Labs    01/30/19 0235  NA 133*  K 4.1  CL 104  CO2 22  BUN 9  CREATININE 0.56  GLUCOSE 112*  CALCIUM 8.1*   No results for input(s): LABPT, INR in the last 72 hours.  Sensation intact distally Intact pulses distally Dorsiflexion/Plantar flexion intact Incision: dressing C/D/I   Assessment/Plan: 1 Day Post-Op Procedure(s) (LRB): LEFT TOTAL HIP ARTHROPLASTY ANTERIOR APPROACH (Left) Up with therapy Discharge home with home health this afternoon.    Patient's anticipated LOS is less than 2 midnights, meeting these requirements: - Younger than 41 - Lives within 1 hour of care - Has a competent adult at home to recover with post-op recover - NO history of  - Chronic pain requiring opiods  - Diabetes  - Coronary Artery Disease  - Heart failure  - Heart attack  - Stroke  - DVT/VTE  - Cardiac arrhythmia  - Respiratory Failure/COPD  - Renal failure  - Anemia  - Advanced Liver disease       Mcarthur Rossetti 01/30/2019, 9:40 AM

## 2019-01-30 NOTE — Progress Notes (Signed)
Physical Therapy Treatment Patient Details Name: Beverly [name] MRN: [id number] DOB: 09-Jul-1945 Today's Date: 01/30/2019    History of Present Illness Pt s/p L THR     PT Comments    Pt progressing well with mobility and eager for dc home.  Spouse present to review car transfers, stairs, and home therex program - written instruction provided.   Follow Up Recommendations  Home health PT;Follow surgeon's recommendation for DC plan and follow-up therapies     Equipment Recommendations  None recommended by PT    Recommendations for Other Services       Precautions / Restrictions Precautions Precautions: Fall Restrictions Weight Bearing Restrictions: No Other Position/Activity Restrictions: WBAT    Mobility  Bed Mobility Overal bed mobility: Needs Assistance Bed Mobility: Supine to Sit     Supine to sit: Min guard     General bed mobility comments: cues for sequence and use of R LE to self assist  Transfers Overall transfer level: Needs assistance Equipment used: Rolling walker (2 wheeled) Transfers: Sit to/from Stand Sit to Stand: Supervision         General transfer comment: cues for LE management and use of UEs to self assist  Ambulation/Gait Ambulation/Gait assistance: Min guard;Supervision Gait Distance (Feet): 150 Feet Assistive device: Rolling walker (2 wheeled) Gait Pattern/deviations: Decreased step length - right;Decreased step length - left;Shuffle;Trunk flexed;Step-to pattern;Step-through pattern Gait velocity: decr   General Gait Details: cues for sequence, posture and postion from RW   Stairs Stairs: Yes Stairs assistance: Min assist Stair Management: Step to pattern;Forwards;No rails;With walker Number of Stairs: 4 General stair comments: 2 stairs twice with RW at top going forward.  Cues for sequence and foot/RW placement.  Written instruction provided for descent with RW   Wheelchair Mobility    Modified Rankin (Stroke Patients Only)        Balance Overall balance assessment: Mild deficits observed, not formally tested                                          Cognition Arousal/Alertness: Awake/alert Behavior During Therapy: WFL for tasks assessed/performed Overall Cognitive Status: Within Functional Limits for tasks assessed                                        Exercises Total Joint Exercises Ankle Circles/Pumps: AROM;Both;15 reps;Supine Quad Sets: AROM;Both;10 reps;Supine Heel Slides: AAROM;Left;20 reps;Supine Hip ABduction/ADduction: AAROM;Left;15 reps;Supine    General Comments        Pertinent Vitals/Pain Pain Assessment: 0-10 Pain Score: 4  Pain Location: L hip/thigh Pain Descriptors / Indicators: Aching;Burning Pain Intervention(s): Limited activity within patient's tolerance;Monitored during session;Premedicated before session;Ice applied    Home Living                      Prior Function            PT Goals (current goals can now be found in the care plan section) Acute Rehab PT Goals Patient Stated Goal: Regain IND PT Goal Formulation: With patient Time For Goal Achievement: 02/05/19 Potential to Achieve Goals: Good Progress towards PT goals: Progressing toward goals    Frequency    7X/week      PT Plan Current plan remains appropriate    Co-evaluation  AM-PAC PT "6 Clicks" Mobility   Outcome Measure  Help needed turning from your back to your side while in a flat bed without using bedrails?: A Little Help needed moving from lying on your back to sitting on the side of a flat bed without using bedrails?: A Little Help needed moving to and from a bed to a chair (including a wheelchair)?: A Little Help needed standing up from a chair using your arms (e.g., wheelchair or bedside chair)?: A Little Help needed to walk in hospital room?: A Little Help needed climbing 3-5 steps with a railing? : A Little 6 Click  Score: 18    End of Session Equipment Utilized During Treatment: Gait belt Activity Tolerance: Patient tolerated treatment well Patient left: in chair;with call bell/phone within reach Nurse Communication: Mobility status PT Visit Diagnosis: Difficulty in walking, not elsewhere classified (R26.2)     Time: 1025-1100 PT Time Calculation (min) (ACUTE ONLY): 35 min  Charges:  $Gait Training: 8-22 mins $Therapeutic Exercise: 8-22 mins                     [place] Pager [phone] Office [phone]    Berenis Corter 01/30/2019, 12:25 PM

## 2019-01-30 NOTE — Discharge Instructions (Signed)

## 2019-01-30 NOTE — Discharge Summary (Signed)
Patient ID: Beverly [name] MRN: [id number] DOB/AGE: 73-Feb-1947 73 y.o.  Admit date: 01/29/2019 Discharge date: 01/30/2019  Admission Diagnoses:  Principal Problem:   Primary osteoarthritis of left hip Active Problems:   Status post total replacement of left hip   Discharge Diagnoses:  Same  Past Medical History:  Diagnosis Date  . Basal cell carcinoma    mid back  . Esophageal dysmotility   . GERD (gastroesophageal reflux disease)    occassional  . OA (osteoarthritis)    hips and knees  . Pneumonia    history of   . PONV (postoperative nausea and vomiting)     Surgeries: Procedure(s): LEFT TOTAL HIP ARTHROPLASTY ANTERIOR APPROACH on 01/29/2019   Consultants:   Discharged Condition: Improved  Hospital Course: Canya Umbel is an 73 y.o. female who was admitted 01/29/2019 for operative treatment ofPrimary osteoarthritis of left hip. Patient has severe unremitting pain that affects sleep, daily activities, and work/hobbies. After pre-op clearance the patient was taken to the operating room on 01/29/2019 and underwent  Procedure(s): LEFT TOTAL HIP ARTHROPLASTY ANTERIOR APPROACH.    Patient was given perioperative antibiotics:  Anti-infectives (From admission, onward)   Start     Dose/Rate Route Frequency Ordered Stop   01/29/19 1500  ceFAZolin (ANCEF) IVPB 1 g/50 mL premix     1 g 100 mL/hr over 30 Minutes Intravenous Every 6 hours 01/29/19 1149 01/29/19 2111   01/29/19 0715  ceFAZolin (ANCEF) IVPB 2g/100 mL premix     2 g 200 mL/hr over 30 Minutes Intravenous On call to O.R. 01/29/19 0701 01/29/19 0858   01/29/19 0707  ceFAZolin (ANCEF) 2-4 GM/100ML-% IVPB    Note to Pharmacy: Randa Evens  : cabinet override      01/29/19 0707 01/29/19 0848       Patient was given sequential compression devices, early ambulation, and chemoprophylaxis to prevent DVT.  Patient benefited maximally from hospital stay and there were no complications.    Recent vital signs:  Patient  Vitals for the past 24 hrs:  BP Temp Temp src Pulse Resp SpO2  01/30/19 0553 115/63 98.4 F (36.9 C) Oral 78 20 100 %  01/30/19 0153 110/80 97.8 F (36.6 C) - (!) 59 18 100 %  01/29/19 2017 129/77 98.5 F (36.9 C) Oral 66 20 97 %  01/29/19 1446 130/65 98.1 F (36.7 C) Oral 75 16 100 %  01/29/19 1351 128/83 98 F (36.7 C) - 71 16 100 %  01/29/19 1159 124/79 (!) 97.5 F (36.4 C) Oral (!) 58 15 100 %  01/29/19 1130 115/66 (!) 97.4 F (36.3 C) - (!) 55 12 100 %  01/29/19 1115 113/64 - - (!) 56 11 100 %  01/29/19 1100 108/67 - - (!) 57 12 100 %  01/29/19 1045 107/68 - - (!) 55 12 100 %  01/29/19 1035 97/69 - - 62 17 98 %     Recent laboratory studies:  Recent Labs    01/30/19 0235  WBC 9.5  HGB 10.7*  HCT 32.8*  PLT 152  NA 133*  K 4.1  CL 104  CO2 22  BUN 9  CREATININE 0.56  GLUCOSE 112*  CALCIUM 8.1*     Discharge Medications:   Allergies as of 01/30/2019      Reactions   Macrodantin Hives   Sulfa Antibiotics Palpitations      Medication List    TAKE these medications   acetaminophen 650 MG CR tablet Commonly known as: TYLENOL Take 1,300 mg  by mouth every 8 (eight) hours as needed for pain.   aspirin 81 MG chewable tablet Chew 1 tablet (81 mg total) by mouth 2 (two) times daily.   B-12 2500 MCG Tabs Take 2,500 mcg by mouth daily.   Biotin 5000 MCG Tabs Take 5,000 mcg by mouth daily.   CALCIUM 600 + D PO Take 1 tablet by mouth daily.   estradiol 0.025 MG/24HR Commonly known as: VIVELLE-DOT Place 1 patch onto the skin 2 (two) times a week. ALORA   ibuprofen 200 MG tablet Commonly known as: ADVIL Take 400 mg by mouth every 8 (eight) hours as needed.   Lysine 500 MG Caps Take 500 mg by mouth daily.   methocarbamol 500 MG tablet Commonly known as: ROBAXIN Take 1 tablet (500 mg total) by mouth every 6 (six) hours as needed for muscle spasms.   multivitamin with minerals tablet Take 1 tablet by mouth daily.   OVER THE COUNTER MEDICATION Take  3 capsules by mouth 2 (two) times daily. [place] RHUBARB   oxyCODONE 5 MG immediate release tablet Commonly known as: Oxy IR/ROXICODONE Take 1-2 tablets (5-10 mg total) by mouth every 6 (six) hours as needed for moderate pain (pain score 4-6).   pantoprazole 40 MG tablet Commonly known as: PROTONIX TAKE 1 TABLET BY MOUTH DAILY.  'OV NEEDED FOR ADDITIONAL REFILLS"   progesterone 100 MG capsule Commonly known as: PROMETRIUM Take 100 mg by mouth every Monday, Wednesday, and Friday.   pyridoxine 100 MG tablet Commonly known as: B-6 Take 100 mg by mouth daily.   vitamin C 1000 MG tablet Take 1,000 mg by mouth daily.            Durable Medical Equipment  (From admission, onward)         Start     Ordered   01/29/19 1150  DME Walker rolling  Once    Question:  Patient needs a walker to treat with the following condition  Answer:  Status post total replacement of left hip   01/29/19 1149   01/29/19 1150  DME 3 n 1  Once     01/29/19 1149          Diagnostic Studies: Dg Pelvis Portable  Result Date: 01/29/2019 CLINICAL DATA:  Intraoperative imaging for left hip replacement. EXAM: OPERATIVE LEFT HIP (WITH PELVIS IF PERFORMED) 2 VIEWS TECHNIQUE: Fluoroscopic spot image(s) were submitted for interpretation post-operatively. COMPARISON:  Single-view of the pelvis 08/13/2018. FINDINGS: Images demonstrate a left total hip arthroplasty in place. The device is located. No fracture. Right hip osteoarthritis noted. IMPRESSION: Intraoperative imaging for left hip replacement.  No acute finding. Electronically Signed   By: Inge Rise M.D.   On: 01/29/2019 11:29   Dg C-arm 1-60 Min-no Report  Result Date: 01/29/2019 Fluoroscopy was utilized by the requesting physician.  No radiographic interpretation.   Dg Hip Operative Unilat W Or W/o Pelvis Left  Result Date: 01/29/2019 CLINICAL DATA:  Intraoperative imaging for left hip replacement. EXAM: OPERATIVE LEFT HIP (WITH PELVIS IF  PERFORMED) 2 VIEWS TECHNIQUE: Fluoroscopic spot image(s) were submitted for interpretation post-operatively. COMPARISON:  Single-view of the pelvis 08/13/2018. FINDINGS: Images demonstrate a left total hip arthroplasty in place. The device is located. No fracture. Right hip osteoarthritis noted. IMPRESSION: Intraoperative imaging for left hip replacement.  No acute finding. Electronically Signed   By: Inge Rise M.D.   On: 01/29/2019 11:29    Disposition: Discharge disposition: 01-Home or Self Care  Follow-up Information    Mcarthur Rossetti, MD. Go on 02/11/2019.   Specialty: Orthopedic Surgery Why: at 2:30 pm with Benita Stabile, PA-C for your 2 week post-op appointment. Contact information: [place] [address] [phone]        [facility], [facility] Follow up.   Specialty: Home Health Services Why: You have been authorized for [facility] visits. Someone from the agency will be in contact with you to arrange your first appointment after discharge. Contact information: [address] [place] [address] [phone]            Signed: Mcarthur Rossetti 01/30/2019, 9:43 AM

## 2019-01-30 NOTE — Progress Notes (Signed)
Physical Therapy Treatment Patient Details Name: Beverly [name] MRN: [id number] DOB: 01/23/1946 Today's Date: 01/30/2019    History of Present Illness Pt s/p L THR     PT Comments    Pt progressing well with mobility.   Follow Up Recommendations  Home health PT;Follow surgeon's recommendation for DC plan and follow-up therapies     Equipment Recommendations  None recommended by PT    Recommendations for Other Services       Precautions / Restrictions Precautions Precautions: Fall Restrictions Weight Bearing Restrictions: No Other Position/Activity Restrictions: WBAT    Mobility  Bed Mobility Overal bed mobility: Needs Assistance Bed Mobility: Supine to Sit     Supine to sit: Min guard     General bed mobility comments: cues for sequence and use of R LE to self assist  Transfers Overall transfer level: Needs assistance Equipment used: Rolling walker (2 wheeled) Transfers: Sit to/from Stand Sit to Stand: Min guard         General transfer comment: cues for LE management and use of UEs to self assist  Ambulation/Gait Ambulation/Gait assistance: Min guard Gait Distance (Feet): 140 Feet Assistive device: Rolling walker (2 wheeled) Gait Pattern/deviations: Step-to pattern;Decreased step length - right;Decreased step length - left;Shuffle;Trunk flexed Gait velocity: decr   General Gait Details: cues for sequence, posture and postion from [employer]             Wheelchair Mobility    Modified Rankin (Stroke Patients Only)       Balance Overall balance assessment: Mild deficits observed, not formally tested                                          Cognition Arousal/Alertness: Awake/alert Behavior During Therapy: WFL for tasks assessed/performed Overall Cognitive Status: Within Functional Limits for tasks assessed                                        Exercises Total Joint Exercises Ankle Circles/Pumps:  AROM;Both;15 reps;Supine Quad Sets: AROM;Both;10 reps;Supine Heel Slides: AAROM;Left;20 reps;Supine Hip ABduction/ADduction: AAROM;Left;15 reps;Supine    General Comments        Pertinent Vitals/Pain Pain Assessment: 0-10 Pain Score: 4  Pain Location: L hip/thigh Pain Descriptors / Indicators: Aching;Burning Pain Intervention(s): Limited activity within patient's tolerance;Monitored during session;Premedicated before session;Ice applied    Home Living                      Prior Function            PT Goals (current goals can now be found in the care plan section) Acute Rehab PT Goals Patient Stated Goal: Regain IND PT Goal Formulation: With patient Time For Goal Achievement: 02/05/19 Potential to Achieve Goals: Good Progress towards PT goals: Progressing toward goals    Frequency    7X/week      PT Plan Current plan remains appropriate    Co-evaluation              AM-PAC PT "6 Clicks" Mobility   Outcome Measure  Help needed turning from your back to your side while in a flat bed without using bedrails?: A Little Help needed moving from lying on your back to sitting on the side of a flat bed without  using bedrails?: A Little Help needed moving to and from a bed to a chair (including a wheelchair)?: A Little Help needed standing up from a chair using your arms (e.g., wheelchair or bedside chair)?: A Little Help needed to walk in hospital room?: A Little Help needed climbing 3-5 steps with a railing? : A Lot 6 Click Score: 17    End of Session Equipment Utilized During Treatment: Gait belt Activity Tolerance: Patient tolerated treatment well Patient left: in chair;with call bell/phone within reach Nurse Communication: Mobility status PT Visit Diagnosis: Difficulty in walking, not elsewhere classified (R26.2)     Time: [id number] PT Time Calculation (min) (ACUTE ONLY): 32 min  Charges:  $Gait Training: 8-22 mins $Therapeutic Exercise: 8-22  mins                     [place] Pager [phone] Office [phone]    Debi Cousin 01/30/2019, 12:21 PM

## 2019-01-31 DIAGNOSIS — M170 Bilateral primary osteoarthritis of knee: Secondary | ICD-10-CM | POA: Diagnosis not present

## 2019-01-31 DIAGNOSIS — Z87891 Personal history of nicotine dependence: Secondary | ICD-10-CM | POA: Diagnosis not present

## 2019-01-31 DIAGNOSIS — Z96642 Presence of left artificial hip joint: Secondary | ICD-10-CM | POA: Diagnosis not present

## 2019-01-31 DIAGNOSIS — K219 Gastro-esophageal reflux disease without esophagitis: Secondary | ICD-10-CM | POA: Diagnosis not present

## 2019-01-31 DIAGNOSIS — N840 Polyp of corpus uteri: Secondary | ICD-10-CM | POA: Diagnosis not present

## 2019-01-31 DIAGNOSIS — Z471 Aftercare following joint replacement surgery: Secondary | ICD-10-CM | POA: Diagnosis not present

## 2019-01-31 DIAGNOSIS — Z85828 Personal history of other malignant neoplasm of skin: Secondary | ICD-10-CM | POA: Diagnosis not present

## 2019-01-31 DIAGNOSIS — M1611 Unilateral primary osteoarthritis, right hip: Secondary | ICD-10-CM | POA: Diagnosis not present

## 2019-02-01 ENCOUNTER — Telehealth: Payer: Self-pay | Admitting: *Deleted

## 2019-02-01 ENCOUNTER — Encounter (HOSPITAL_COMMUNITY): Payer: Self-pay | Admitting: Orthopaedic Surgery

## 2019-02-01 NOTE — Telephone Encounter (Signed)
Left VM for patient to check status after discharge from hospital over the w/e.

## 2019-02-02 ENCOUNTER — Telehealth: Payer: Self-pay | Admitting: *Deleted

## 2019-02-02 DIAGNOSIS — Z471 Aftercare following joint replacement surgery: Secondary | ICD-10-CM | POA: Diagnosis not present

## 2019-02-02 DIAGNOSIS — M170 Bilateral primary osteoarthritis of knee: Secondary | ICD-10-CM | POA: Diagnosis not present

## 2019-02-02 DIAGNOSIS — M1611 Unilateral primary osteoarthritis, right hip: Secondary | ICD-10-CM | POA: Diagnosis not present

## 2019-02-02 DIAGNOSIS — K219 Gastro-esophageal reflux disease without esophagitis: Secondary | ICD-10-CM | POA: Diagnosis not present

## 2019-02-02 DIAGNOSIS — Z96642 Presence of left artificial hip joint: Secondary | ICD-10-CM | POA: Diagnosis not present

## 2019-02-02 DIAGNOSIS — N840 Polyp of corpus uteri: Secondary | ICD-10-CM | POA: Diagnosis not present

## 2019-02-02 MED ORDER — OXYCODONE HCL 5 MG PO TABS: 5.0000 mg | ORAL_TABLET | Freq: Four times a day (QID) | ORAL | 0 refills | Status: DC | PRN

## 2019-02-02 NOTE — Care Plan (Signed)
RNCM received call back from patient this morning after leaving message on her home phone yesterday afternoon to check her discharge status after Left Total hip arthroplasty on 01/29/19. She indicated that there was difficulty over the weekend getting the Robaxin filled at the pharmacy and she finally paid out of pocket instead of billing her insurance for this. She verbalized she thought she would be up using a cane and much better by today, but feels she is in an extreme amount of pain. Taking Oxycodone (2 tablets) every 6 hours as prescribed. Therapy has been out on Sunday for her initial evaluation and will be coming today. After hanging up with patient, she called back and requested a refill of her pain medication. RNCM sent message to MD, who did refill her pain medication, but did suggest she try to space it out. Call back to patient and discussed this along with use of Tylenol in between for pain. She verbalized she would try this as well.

## 2019-02-02 NOTE — Telephone Encounter (Signed)
Discharge Ortho bundle call completed. 

## 2019-02-02 NOTE — Telephone Encounter (Signed)
I will call her and discuss with her. Tnx.

## 2019-02-02 NOTE — Telephone Encounter (Signed)
Patient called and is requesting a refill of her pain medication.

## 2019-02-02 NOTE — Telephone Encounter (Signed)
I sent some more in.  She was just discharged on Saturday with 30 pills of oxycodone.  Is she already out?  She needs to try to not take as much.

## 2019-02-04 DIAGNOSIS — Z471 Aftercare following joint replacement surgery: Secondary | ICD-10-CM | POA: Diagnosis not present

## 2019-02-04 DIAGNOSIS — M1611 Unilateral primary osteoarthritis, right hip: Secondary | ICD-10-CM | POA: Diagnosis not present

## 2019-02-04 DIAGNOSIS — K219 Gastro-esophageal reflux disease without esophagitis: Secondary | ICD-10-CM | POA: Diagnosis not present

## 2019-02-04 DIAGNOSIS — Z96642 Presence of left artificial hip joint: Secondary | ICD-10-CM | POA: Diagnosis not present

## 2019-02-04 DIAGNOSIS — M170 Bilateral primary osteoarthritis of knee: Secondary | ICD-10-CM | POA: Diagnosis not present

## 2019-02-04 DIAGNOSIS — N840 Polyp of corpus uteri: Secondary | ICD-10-CM | POA: Diagnosis not present

## 2019-02-05 ENCOUNTER — Telehealth: Payer: Self-pay | Admitting: *Deleted

## 2019-02-05 NOTE — Telephone Encounter (Signed)
Ortho bundle 1 week call completed on 02/04/19

## 2019-02-09 DIAGNOSIS — N840 Polyp of corpus uteri: Secondary | ICD-10-CM | POA: Diagnosis not present

## 2019-02-09 DIAGNOSIS — M1611 Unilateral primary osteoarthritis, right hip: Secondary | ICD-10-CM | POA: Diagnosis not present

## 2019-02-09 DIAGNOSIS — M170 Bilateral primary osteoarthritis of knee: Secondary | ICD-10-CM | POA: Diagnosis not present

## 2019-02-09 DIAGNOSIS — Z96642 Presence of left artificial hip joint: Secondary | ICD-10-CM | POA: Diagnosis not present

## 2019-02-09 DIAGNOSIS — Z471 Aftercare following joint replacement surgery: Secondary | ICD-10-CM | POA: Diagnosis not present

## 2019-02-09 DIAGNOSIS — K219 Gastro-esophageal reflux disease without esophagitis: Secondary | ICD-10-CM | POA: Diagnosis not present

## 2019-02-10 DIAGNOSIS — M170 Bilateral primary osteoarthritis of knee: Secondary | ICD-10-CM | POA: Diagnosis not present

## 2019-02-10 DIAGNOSIS — Z471 Aftercare following joint replacement surgery: Secondary | ICD-10-CM | POA: Diagnosis not present

## 2019-02-10 DIAGNOSIS — Z96642 Presence of left artificial hip joint: Secondary | ICD-10-CM | POA: Diagnosis not present

## 2019-02-10 DIAGNOSIS — K219 Gastro-esophageal reflux disease without esophagitis: Secondary | ICD-10-CM | POA: Diagnosis not present

## 2019-02-10 DIAGNOSIS — M1611 Unilateral primary osteoarthritis, right hip: Secondary | ICD-10-CM | POA: Diagnosis not present

## 2019-02-10 DIAGNOSIS — N840 Polyp of corpus uteri: Secondary | ICD-10-CM | POA: Diagnosis not present

## 2019-02-11 ENCOUNTER — Ambulatory Visit (INDEPENDENT_AMBULATORY_CARE_PROVIDER_SITE_OTHER): Payer: Medicare Other | Admitting: Physician Assistant

## 2019-02-11 ENCOUNTER — Encounter: Payer: Self-pay | Admitting: Physician Assistant

## 2019-02-11 DIAGNOSIS — Z96642 Presence of left artificial hip joint: Secondary | ICD-10-CM

## 2019-02-11 NOTE — Progress Notes (Signed)
HPI: Mrs. Grenfell returns today 2-week status post left total hip arthroplasty.  She is overall doing well.  She brings a cane with her today but it is not really using it.  She is taking aspirin twice daily for DVT prophylaxis.  She was on no aspirin prior to surgery.  She is taking Tylenol for pain at this point.  She has no complaints.  Review of systems: No fevers chills shortness of breath chest pain.  Physical exam: Left hip surgical incisions healing well no signs of infection.  Slight seroma.  Left calf supple nontender.  Ambulates without an assistive device carrying a cane.  She is able to get up and down on her own without using the chair arms to push herself up.  Impression: Status post left total hip arthroplasty 01/27/2019  Plan: She will work on scar tissue mobilization.  She is able to get incision wet.  She will let Steri-Strips fall off with time.  Follow-up in 1 month sooner if there is any questions concerns or if the seroma becomes larger.  Questions were encouraged and answered at length.  Aspirin once daily for another week and then discontinue.

## 2019-02-12 ENCOUNTER — Telehealth: Payer: Self-pay | Admitting: *Deleted

## 2019-02-12 NOTE — Telephone Encounter (Signed)
RNCM completed in person-14 day office visit on 02/11/19. Patient doing well.

## 2019-02-18 ENCOUNTER — Telehealth: Payer: Self-pay | Admitting: *Deleted

## 2019-02-18 ENCOUNTER — Other Ambulatory Visit: Payer: Self-pay | Admitting: Physician Assistant

## 2019-02-18 ENCOUNTER — Telehealth: Payer: Self-pay

## 2019-02-18 MED ORDER — TEMAZEPAM 7.5 MG PO CAPS: 7.5000 mg | ORAL_CAPSULE | Freq: Every evening | ORAL | 0 refills | Status: DC | PRN

## 2019-02-18 NOTE — Telephone Encounter (Signed)
Patient called and stated she is having much difficulty sleeping; even if she gets to sleep only stays asleep 1-1.5 hours at a time; Has tried Melatonin and Benadryl in the past, but nothing works well; Requesting if something could be prescribed to help with sleep? Also states she feels a stitch that is under the skin at bottom of incision; Notes no redness/swelling/irritation; told her to leave this alone and call if it changes. Thank you.

## 2019-02-18 NOTE — Telephone Encounter (Signed)
IC LMVM advising patient Beverly [name] submitted Restoril to her pharmacy.

## 2019-03-02 ENCOUNTER — Telehealth: Payer: Self-pay | Admitting: *Deleted

## 2019-03-02 NOTE — Telephone Encounter (Signed)
30 day Ortho bundle call completed; Survey completed (4 questions provided by TOM/THN).

## 2019-03-02 NOTE — Telephone Encounter (Signed)
Patient called and said Restoril that was called in for sleep 2 weeks ago, did not help at all. She did try 1/2 of a 10 mg Ambien from her daughter (5 mg), that really helped with sleep. Do you recommend trying something different or having her call her PCP to discuss the Ambien? She indicated she sometimes can't get to sleep at all or if she does, then wakes up after an hour or so since surgery and maybe a few times before. Not waking from pain related to hip it seems. Please advise. Thank you.

## 2019-03-02 NOTE — Telephone Encounter (Signed)
Needs to contact PCP

## 2019-03-02 NOTE — Care Plan (Signed)
RNCM spoke with patient today for 30 day Ortho bundle call. Patient verbalized she is currently still having trouble with sleep. Having difficulty either getting to sleep or staying asleep. Prescribed Restoril approximately 2 weeks ago by Dr. Trevor Mace PA. She reports this did not help at all. She did however take 1/2 of a 10 mg Ambien from her daughter, which did help. Sent message to PA to request if she should contact her PCP for assistance with sleep at this point. PA indicated that patient should contact her PCP. Updated patient of this information. Reviewed Patient satisfaction survey provided by TOM/THN in place of previous 10 question survey in Epic.  1. Prior to surgery I was provided sufficient education regarding my surgery and the bundle program. Patient's answer- Strongly agree 2. I was satisfied with the care I received at the facility where my surgery was performed. Patient's answer- Strongly agree 3. Following surgery, I received sufficient postoperative care instructions. Patient answer- Agree 4. I would recommend my surgeon and this bundle program to others. Patient's answer-Agree

## 2019-03-02 NOTE — Telephone Encounter (Signed)
I will contact her and make her aware. Thanks.

## 2019-03-08 ENCOUNTER — Telehealth: Payer: Self-pay | Admitting: *Deleted

## 2019-03-08 DIAGNOSIS — G4700 Insomnia, unspecified: Secondary | ICD-10-CM | POA: Diagnosis not present

## 2019-03-08 NOTE — Telephone Encounter (Signed)
Patient contacted office this morning and states she has broken a tooth and her dentist will need something in writing stating if she can proceed with dental work since she is 5 weeks S/P hip replacement. Patient indicated she has also been started on Ciprofloxacin by her OB/GYN on 03/06/2019. RNCM spoke with Dr. Mirna Mires, DDS office and verified where to send letter. Indicated this has been discussed with Dr. Ninfa Linden and he is fine with proceeding with dental work. Also verified with patient's OB/GYN that she is currently prescribed Cipro 500 mg bid for 5 days. This is adequate coverage. Letter sent to Dr. Evelene Croon' office on Dr. Trevor Mace behalf.

## 2019-03-11 ENCOUNTER — Encounter: Payer: Self-pay | Admitting: Physician Assistant

## 2019-03-11 ENCOUNTER — Other Ambulatory Visit: Payer: Self-pay

## 2019-03-11 ENCOUNTER — Ambulatory Visit (INDEPENDENT_AMBULATORY_CARE_PROVIDER_SITE_OTHER): Payer: Medicare Other | Admitting: Physician Assistant

## 2019-03-11 DIAGNOSIS — Z96642 Presence of left artificial hip joint: Secondary | ICD-10-CM

## 2019-03-11 NOTE — Progress Notes (Signed)
HPI: Mrs. Solak returns now 41 days status post left total hip arthroplasty.  She is overall doing well has no concerns or questions.  Just has some tightness when she first begins to ambulate.  Physical exam: Left hip good range of motion without pain calf supple nontender.  Dorsiflexion plantarflexion ankle intact.  Surgical incision healing well slight scar formation distal incision.   Impression: Status post left total hip arthroplasty 01/29/2019  Plan: She will continue work on strengthening range of motion.  Reassurance is given the stiffness should resolve with time.  She will follow-up with [place] in 3 months at that time she did like to discuss right total hip arthroplasty.  Questions encouraged and answered at length.  Scar tissue mobilization encouraged

## 2019-03-19 DIAGNOSIS — Z23 Encounter for immunization: Secondary | ICD-10-CM | POA: Diagnosis not present

## 2019-04-01 DIAGNOSIS — C44519 Basal cell carcinoma of skin of other part of trunk: Secondary | ICD-10-CM | POA: Diagnosis not present

## 2019-04-01 DIAGNOSIS — L821 Other seborrheic keratosis: Secondary | ICD-10-CM | POA: Diagnosis not present

## 2019-04-01 DIAGNOSIS — L578 Other skin changes due to chronic exposure to nonionizing radiation: Secondary | ICD-10-CM | POA: Diagnosis not present

## 2019-04-01 DIAGNOSIS — L570 Actinic keratosis: Secondary | ICD-10-CM | POA: Diagnosis not present

## 2019-04-01 DIAGNOSIS — L820 Inflamed seborrheic keratosis: Secondary | ICD-10-CM | POA: Diagnosis not present

## 2019-04-25 DIAGNOSIS — N390 Urinary tract infection, site not specified: Secondary | ICD-10-CM | POA: Diagnosis not present

## 2019-05-14 ENCOUNTER — Telehealth: Payer: Self-pay | Admitting: *Deleted

## 2019-05-14 NOTE — Telephone Encounter (Signed)
Left VM on patient's home phone attempting Ortho bundle 90 day call. Will return call on Monday if don't hear from patient before end of the day today.

## 2019-05-17 ENCOUNTER — Telehealth: Payer: Self-pay | Admitting: *Deleted

## 2019-05-17 NOTE — Telephone Encounter (Signed)
90 day Ortho bundle call and survey completed.

## 2019-05-17 NOTE — Care Plan (Signed)
RNCM received call back from patient for 3 month survey and status check. She reports she is doing extremely well 90 days post-op from Left Total hip arthroplasty. She is hiking and able to do all activities of daily living. Does have some residual tightness in the groin/outer hip as well. Discussed that on average, this may take typically 3 months, but may take a little longer to resolve. She wants to discuss possibly scheduling surgery for the Right hip as well when here next. Appointment scheduled for next week with Benita Stabile, PA-C, on 05/24/19 at 1:00 pm.

## 2019-05-24 ENCOUNTER — Ambulatory Visit (INDEPENDENT_AMBULATORY_CARE_PROVIDER_SITE_OTHER): Payer: Medicare Other | Admitting: Physician Assistant

## 2019-05-24 ENCOUNTER — Other Ambulatory Visit: Payer: Self-pay

## 2019-05-24 ENCOUNTER — Encounter: Payer: Self-pay | Admitting: Physician Assistant

## 2019-05-24 DIAGNOSIS — Z96642 Presence of left artificial hip joint: Secondary | ICD-10-CM | POA: Diagnosis not present

## 2019-05-24 DIAGNOSIS — M1611 Unilateral primary osteoarthritis, right hip: Secondary | ICD-10-CM

## 2019-05-24 NOTE — Progress Notes (Signed)
HPI: Beverly [name] returns today 3 months 24 days status post left total hip arthroplasty.  She is overall doing well.  She still has some tightness of the hip and some swelling about the incision.  She is wondering when she should have the right hip time.  Physical exam: Left hip good range of motion she is able to cross her legs mildly, full abduction.  Near.  She ambulates without any assistive device.  Nonantalgic gait on the left.  Impressions: Status post left total hip arthroplasty 01/29/2019 Right hip osteoarthritis  Plan: She is given Samella Parr card she will call us whenever she wishes to proceed with the right total hip arthroplasty.  Told her to do the Covid pandemic are scheduling this somewhat unknown.

## 2019-07-17 ENCOUNTER — Ambulatory Visit: Payer: Medicare Other | Attending: Internal Medicine

## 2019-07-17 DIAGNOSIS — Z23 Encounter for immunization: Secondary | ICD-10-CM | POA: Insufficient documentation

## 2019-07-17 NOTE — Progress Notes (Signed)
   Covid-19 Vaccination Clinic  Name:  Johnson Terrana    MRN: [id number] DOB: 1946-02-07  07/17/2019  Ms. Rhames was observed post Covid-19 immunization for 15 minutes without incidence. She was provided with Vaccine Information Sheet and instruction to access the V-Safe system.   Ms. Witherite was instructed to call 911 with any severe reactions post vaccine: Marland Kitchen Difficulty breathing  . Swelling of your face and throat  . A fast heartbeat  . A bad rash all over your body  . Dizziness and weakness    Immunizations Administered    Name Date Dose VIS Date Route   Pfizer COVID-19 Vaccine 07/17/2019  2:22 PM 0.3 mL 05/14/2019 Intramuscular   Manufacturer: [place]   Lot: [id number]   [place]: [id number]

## 2019-07-19 DIAGNOSIS — R300 Dysuria: Secondary | ICD-10-CM | POA: Diagnosis not present

## 2019-07-19 DIAGNOSIS — Z87440 Personal history of urinary (tract) infections: Secondary | ICD-10-CM | POA: Diagnosis not present

## 2019-08-09 ENCOUNTER — Ambulatory Visit: Payer: Medicare Other | Attending: Internal Medicine

## 2019-08-09 DIAGNOSIS — Z23 Encounter for immunization: Secondary | ICD-10-CM | POA: Insufficient documentation

## 2019-08-09 NOTE — Progress Notes (Signed)
   Covid-19 Vaccination Clinic  Name:  Beverly [name]    MRN: [id number] DOB: [birth year]  08/09/2019  [name] was observed post Covid-19 immunization for 15 minutes without incident. She was provided with Vaccine Information Sheet and instruction to access the V-Safe system.   [name] was instructed to call 911 with any severe reactions post vaccine: Marland Kitchen Difficulty breathing  . Swelling of face and throat  . A fast heartbeat  . A bad rash all over body  . Dizziness and weakness   Immunizations Administered    Name Date Dose VIS Date Route   Pfizer COVID-19 Vaccine 08/09/2019 12:17 PM 0.3 mL 05/14/2019 Intramuscular   Manufacturer: [place]   Lot: [id number]   [place]: [id number]

## 2019-08-23 DIAGNOSIS — H6123 Impacted cerumen, bilateral: Secondary | ICD-10-CM | POA: Diagnosis not present

## 2019-08-23 DIAGNOSIS — H6693 Otitis media, unspecified, bilateral: Secondary | ICD-10-CM | POA: Diagnosis not present

## 2019-08-26 ENCOUNTER — Ambulatory Visit (INDEPENDENT_AMBULATORY_CARE_PROVIDER_SITE_OTHER): Payer: Medicare Other | Admitting: Dermatology

## 2019-08-26 ENCOUNTER — Other Ambulatory Visit: Payer: Self-pay

## 2019-08-26 DIAGNOSIS — L821 Other seborrheic keratosis: Secondary | ICD-10-CM

## 2019-08-26 DIAGNOSIS — L814 Other melanin hyperpigmentation: Secondary | ICD-10-CM | POA: Diagnosis not present

## 2019-08-26 DIAGNOSIS — D229 Melanocytic nevi, unspecified: Secondary | ICD-10-CM

## 2019-08-26 DIAGNOSIS — L578 Other skin changes due to chronic exposure to nonionizing radiation: Secondary | ICD-10-CM

## 2019-08-26 DIAGNOSIS — D225 Melanocytic nevi of trunk: Secondary | ICD-10-CM

## 2019-08-26 DIAGNOSIS — Z85828 Personal history of other malignant neoplasm of skin: Secondary | ICD-10-CM | POA: Diagnosis not present

## 2019-08-26 DIAGNOSIS — D1801 Hemangioma of skin and subcutaneous tissue: Secondary | ICD-10-CM

## 2019-08-26 DIAGNOSIS — D485 Neoplasm of uncertain behavior of skin: Secondary | ICD-10-CM | POA: Diagnosis not present

## 2019-08-26 DIAGNOSIS — L820 Inflamed seborrheic keratosis: Secondary | ICD-10-CM

## 2019-08-26 DIAGNOSIS — Z1283 Encounter for screening for malignant neoplasm of skin: Secondary | ICD-10-CM

## 2019-08-26 NOTE — Progress Notes (Signed)
   Follow-Up Visit   Subjective  Beverly [name] is a 74 y.o. female who presents for the following: Annual Exam (History of Dysplastic nevus - right prox post lat thigh. History of BCC ).  She is concerned concerned about a thickening area of the basal cell site of her left chest.  She also has a spot on her left breast that has been there for a few weeks.  It has been red and scaly.  She presents for skin cancer screening today.  The following portions of the chart were reviewed this encounter and updated as appropriate:     Review of Systems: No other skin or systemic complaints.  Objective  Well appearing patient in no apparent distress; mood and affect are within normal limits.  A full examination was performed including scalp, head, eyes, ears, nose, lips, neck, chest, axillae, abdomen, back, buttocks, bilateral upper extremities, bilateral lower extremities, hands, feet, fingers, toes, fingernails, and toenails. All findings within normal limits unless otherwise noted below.  Objective  Generalized: Actinic changes  Objective  Trunk: Red papules.  Objective  Left chest parasternal: 1.5 x 1.0 cm pink macule with central thickening 0.8 x 0.5 cm.  Images    Objective  Left calf, Superior chest (2): Erythematous keratotic or waxy stuck-on papule or plaque.   Objective  Face, shoulders: Scattered tan macules.   Objective  Left Breast: 0.6 x 0.5 cm pink patch.     Objective  Trunk: Regular brown macules and papule.  Objective  Face, trunk, extremities: Stuck-on, waxy, tan-brown papule or plaque --Discussed benign etiology and prognosis.    Assessment & Plan  Actinic skin damage Generalized  Recommend broad spectrum SPF and photoprotection   Hemangioma of skin Trunk  Observe   History of basal cell carcinoma (BCC) Left chest parasternal  Photo today. Seems to scar tissue. Will observe for changes.  Inflamed seborrheic keratosis  (3) Superior chest (2); Left calf  Destruction of lesion - Left calf, Superior chest Complexity: simple   Destruction method: cryotherapy   Informed consent: discussed and consent obtained   Timeout:  patient name, date of birth, surgical site, and procedure verified Lesion destroyed using liquid nitrogen: Yes   Region frozen until ice ball extended beyond lesion: Yes   Outcome: patient tolerated procedure well with no complications   Post-procedure details: wound care instructions given    Lentigines Face, shoulders  Observe   Neoplasm of uncertain behavior of skin Left Breast  Photo today. Observe for changes. Will plan biopsy if persistent.  Nevus Trunk  Benign, observe.    Seborrheic keratosis Face, trunk, extremities  Discussed LN2. Advised patient fee is $60 for first lesion and $15 each additional.  Skin cancer screening   Return in about 6 months (around 02/26/2020).   I, Ashok Cordia, CMA, am acting as scribe for Sarina Ser, MD .

## 2019-08-26 NOTE — Patient Instructions (Signed)

## 2019-09-01 DIAGNOSIS — H6123 Impacted cerumen, bilateral: Secondary | ICD-10-CM | POA: Diagnosis not present

## 2019-09-02 DIAGNOSIS — N3020 Other chronic cystitis without hematuria: Secondary | ICD-10-CM | POA: Diagnosis not present

## 2019-09-02 DIAGNOSIS — N952 Postmenopausal atrophic vaginitis: Secondary | ICD-10-CM | POA: Diagnosis not present

## 2019-09-06 ENCOUNTER — Other Ambulatory Visit: Payer: Self-pay

## 2019-09-16 ENCOUNTER — Other Ambulatory Visit: Payer: Self-pay | Admitting: Physician Assistant

## 2019-09-17 NOTE — Patient Instructions (Addendum)
DUE TO COVID-19 ONLY ONE VISITOR IS ALLOWED TO COME WITH YOU AND STAY IN THE WAITING ROOM ONLY DURING PRE OP AND PROCEDURE DAY OF SURGERY. THE 1 VISITOR MAY VISIT WITH YOU AFTER SURGERY IN YOUR PRIVATE ROOM DURING VISITING HOURS ONLY!  YOU NEED TO HAVE A COVID 19 TEST ON 09-21-19 @ 11:00 AM, THIS TEST MUST BE DONE BEFORE SURGERY, COME  [address], [place] , [id number].  ([place]) ONCE YOUR COVID TEST IS COMPLETED, PLEASE BEGIN THE QUARANTINE INSTRUCTIONS AS OUTLINED IN YOUR HANDOUT.                Beverly [name]  09/17/2019   Your procedure is scheduled on: 09-24-19   Report to [facility] Main  Entrance    Report to admitting at 9:45 AM     Call this number if you have problems the morning of surgery [phone]    Remember: NO SOLID FOOD AFTER MIDNIGHT THE NIGHT PRIOR TO SURGERY. NOTHING BY MOUTH EXCEPT CLEAR LIQUIDS UNTIL 9:15 AM . PLEASE FINISH ENSURE DRINK PER SURGEON ORDER  WHICH NEEDS TO BE COMPLETED AT 9:15 AM .     CLEAR LIQUID DIET   Foods Allowed                                                                     Foods Excluded  Coffee and tea, regular and decaf                             liquids that you cannot  Plain Jell-O any favor except red or purple                                           see through such as: Fruit ices (not with fruit pulp)                                     milk, soups, orange juice  Iced Popsicles                                    All solid food Carbonated beverages, regular and diet                                    Cranberry, grape and apple juices Sports drinks like Gatorade Lightly seasoned clear broth or consume(fat free) Sugar, honey syrup   _____________________________________________________________________    Take these medicines the morning of surgery with A SIP OF WATER: None  BRUSH YOUR TEETH MORNING OF SURGERY AND RINSE YOUR MOUTH OUT, NO CHEWING GUM CANDY OR MINTS.                                 You may not have any metal on your body including hair pins  and              piercings     Do not wear jewelry, make-up, lotions, powders or perfumes, deodorant              Do not wear nail polish on your fingernails.  Do not shave  48 hours prior to surgery.          Do not bring valuables to the hospital. [place].  Contacts, dentures or bridgework may not be worn into surgery.  You may bring an overnight bag      Special Instructions: N/A              Please read over the following fact sheets you were given: _____________________________________________________________________             [facility] - Preparing for Surgery Before surgery, you can play an important role.  Because skin is not sterile, your skin needs to be as free of germs as possible.  You can reduce the number of germs on your skin by washing with CHG (chlorahexidine gluconate) soap before surgery.  CHG is an antiseptic cleaner which kills germs and bonds with the skin to continue killing germs even after washing. Please DO NOT use if you have an allergy to CHG or antibacterial soaps.  If your skin becomes reddened/irritated stop using the CHG and inform your nurse when you arrive at Short Stay. Do not shave (including legs and underarms) for at least 48 hours prior to the first CHG shower.  You may shave your face/neck. Please follow these instructions carefully:  1.  Shower with CHG Soap the night before surgery and the  morning of Surgery.  2.  If you choose to wash your hair, wash your hair first as usual with your  normal  shampoo.  3.  After you shampoo, rinse your hair and body thoroughly to remove the  shampoo.                           4.  Use CHG as you would any other liquid soap.  You can apply chg directly  to the skin and wash                       Gently with a scrungie or clean washcloth.  5.  Apply the CHG Soap to your body ONLY  FROM THE NECK DOWN.   Do not use on face/ open                           Wound or open sores. Avoid contact with eyes, ears mouth and genitals (private parts).                       Wash face,  Genitals (private parts) with your normal soap.             6.  Wash thoroughly, paying special attention to the area where your surgery  will be performed.  7.  Thoroughly rinse your body with warm water from the neck down.  8.  DO NOT shower/wash with your normal soap after using and rinsing off  the CHG Soap.  9.  Pat yourself dry with a clean towel.            10.  Wear clean pajamas.            11.  Place clean sheets on your bed the night of your first shower and do not  sleep with pets. Day of Surgery : Do not apply any lotions/deodorants the morning of surgery.  Please wear clean clothes to the hospital/surgery center.  FAILURE TO FOLLOW THESE INSTRUCTIONS MAY RESULT IN THE CANCELLATION OF YOUR SURGERY PATIENT SIGNATURE_________________________________  NURSE SIGNATURE__________________________________  ________________________________________________________________________   Adam Phenix  An incentive spirometer is a tool that can help keep your lungs clear and active. This tool measures how well you are filling your lungs with each breath. Taking long deep breaths may help reverse or decrease the chance of developing breathing (pulmonary) problems (especially infection) following:  A long period of time when you are unable to move or be active. BEFORE THE PROCEDURE   If the spirometer includes an indicator to show your best effort, your nurse or respiratory therapist will set it to a desired goal.  If possible, sit up straight or lean slightly forward. Try not to slouch.  Hold the incentive spirometer in an upright position. INSTRUCTIONS FOR USE  1. Sit on the edge of your bed if possible, or sit up as far as you can in bed or on a chair. 2. Hold the incentive  spirometer in an upright position. 3. Breathe out normally. 4. Place the mouthpiece in your mouth and seal your lips tightly around it. 5. Breathe in slowly and as deeply as possible, raising the piston or the ball toward the top of the column. 6. Hold your breath for 3-5 seconds or for as long as possible. Allow the piston or ball to fall to the bottom of the column. 7. Remove the mouthpiece from your mouth and breathe out normally. 8. Rest for a few seconds and repeat Steps 1 through 7 at least 10 times every 1-2 hours when you are awake. Take your time and take a few normal breaths between deep breaths. 9. The spirometer may include an indicator to show your best effort. Use the indicator as a goal to work toward during each repetition. 10. After each set of 10 deep breaths, practice coughing to be sure your lungs are clear. If you have an incision (the cut made at the time of surgery), support your incision when coughing by placing a pillow or rolled up towels firmly against it. Once you are able to get out of bed, walk around indoors and cough well. You may stop using the incentive spirometer when instructed by your caregiver.  RISKS AND COMPLICATIONS  Take your time so you do not get dizzy or light-headed.  If you are in pain, you may need to take or ask for pain medication before doing incentive spirometry. It is harder to take a deep breath if you are having pain. AFTER USE  Rest and breathe slowly and easily.  It can be helpful to keep track of a log of your progress. Your caregiver can provide you with a simple table to help with this. If you are using the spirometer at home, follow these instructions: [place] IF:   You are having difficultly using the spirometer.  You have trouble using the spirometer as often as instructed.  Your pain medication is not giving enough relief while using the spirometer.  You develop  fever of 100.5 F (38.1 C) or higher. SEEK  IMMEDIATE MEDICAL CARE IF:   You cough up bloody sputum that had not been present before.  You develop fever of 102 F (38.9 C) or greater.  You develop worsening pain at or near the incision site. MAKE SURE YOU:   Understand these instructions.  Will watch your condition.  Will get help right away if you are not doing well or get worse. Document Released: 09/30/2006 Document Revised: 08/12/2011 Document Reviewed: 12/01/2006 [facility] Patient Information 2014 [place], [place].   ________________________________________________________________________

## 2019-09-17 NOTE — Progress Notes (Signed)
PCP - Antony Contras, MD Cardiologist -   Chest x-ray -  EKG -  Stress Test -  ECHO -  Cardiac Cath -   Sleep Study -  CPAP -   Fasting Blood Sugar -  Checks Blood Sugar _____ times a day  Blood Thinner Instructions: Aspirin Instructions: Last Dose:  Anesthesia review:   Patient denies shortness of breath, fever, cough and chest pain at PAT appointment   Patient verbalized understanding of instructions that were given to them at the PAT appointment. Patient was also instructed that they will need to review over the PAT instructions again at home before surgery.

## 2019-09-20 ENCOUNTER — Encounter (HOSPITAL_COMMUNITY): Payer: Self-pay

## 2019-09-20 ENCOUNTER — Encounter (HOSPITAL_COMMUNITY)
Admission: RE | Admit: 2019-09-20 | Discharge: 2019-09-20 | Disposition: A | Discharge status: Home / Self Care | Payer: Medicare Other | Source: Ambulatory Visit | Attending: Orthopaedic Surgery | Admitting: Orthopaedic Surgery

## 2019-09-20 ENCOUNTER — Other Ambulatory Visit: Payer: Self-pay

## 2019-09-20 DIAGNOSIS — Z01812 Encounter for preprocedural laboratory examination: Secondary | ICD-10-CM | POA: Insufficient documentation

## 2019-09-20 DIAGNOSIS — Z20822 Contact with and (suspected) exposure to covid-19: Secondary | ICD-10-CM | POA: Insufficient documentation

## 2019-09-21 ENCOUNTER — Encounter (HOSPITAL_COMMUNITY)
Admission: RE | Admit: 2019-09-21 | Discharge: 2019-09-21 | Disposition: A | Discharge status: Home / Self Care | Payer: Medicare Other | Source: Ambulatory Visit | Attending: Orthopaedic Surgery | Admitting: Orthopaedic Surgery

## 2019-09-21 ENCOUNTER — Other Ambulatory Visit (HOSPITAL_COMMUNITY)
Admission: RE | Admit: 2019-09-21 | Discharge: 2019-09-21 | Disposition: A | Discharge status: Home / Self Care | Payer: Medicare Other | Source: Ambulatory Visit | Attending: Orthopaedic Surgery | Admitting: Orthopaedic Surgery

## 2019-09-21 DIAGNOSIS — Z20822 Contact with and (suspected) exposure to covid-19: Secondary | ICD-10-CM | POA: Diagnosis not present

## 2019-09-21 DIAGNOSIS — Z01812 Encounter for preprocedural laboratory examination: Secondary | ICD-10-CM | POA: Diagnosis not present

## 2019-09-21 LAB — SURGICAL PCR SCREEN
MRSA, PCR: NEGATIVE
Staphylococcus aureus: NEGATIVE

## 2019-09-21 LAB — CBC
HCT: 44.5 % (ref 36.0–46.0)
Hemoglobin: 14.6 g/dL (ref 12.0–15.0)
MCH: 29 pg (ref 26.0–34.0)
MCHC: 32.8 g/dL (ref 30.0–36.0)
MCV: 88.5 fL (ref 80.0–100.0)
Platelets: 225 10*3/uL (ref 150–400)
RBC: 5.03 MIL/uL (ref 3.87–5.11)
RDW: 12.8 % (ref 11.5–15.5)
WBC: 6.8 10*3/uL (ref 4.0–10.5)
nRBC: 0 % (ref 0.0–0.2)

## 2019-09-21 LAB — SARS CORONAVIRUS 2 (TAT 6-24 HRS): SARS Coronavirus 2: NEGATIVE

## 2019-09-22 ENCOUNTER — Telehealth: Payer: Self-pay | Admitting: *Deleted

## 2019-09-22 NOTE — Telephone Encounter (Signed)
Attempted Ortho bundle pre-op call. 

## 2019-09-23 ENCOUNTER — Telehealth: Payer: Self-pay | Admitting: *Deleted

## 2019-09-23 ENCOUNTER — Encounter (HOSPITAL_COMMUNITY): Payer: Self-pay | Admitting: Orthopaedic Surgery

## 2019-09-23 MED ORDER — METHOCARBAMOL 500 MG PO TABS: 500.0000 mg | ORAL_TABLET | Freq: Four times a day (QID) | ORAL | 1 refills | Status: DC | PRN

## 2019-09-23 MED ORDER — OXYCODONE HCL 5 MG PO TABS: 5.0000 mg | ORAL_TABLET | ORAL | 0 refills | Status: DC | PRN

## 2019-09-23 NOTE — Telephone Encounter (Signed)
Spoke to patient and she wanted to ask if medications could be called into her pharmacy prior to surgery tomorrow so she didn't have issues getting them over the weekend like she did last time. Also, she requested I tell you she is already on an antibiotic for chronic bladder infections (Keflex 250 mg daily) per her PCP. Thanks.

## 2019-09-23 NOTE — Telephone Encounter (Signed)
I sent it in 

## 2019-09-23 NOTE — H&P (Signed)
TOTAL HIP ADMISSION H&P  Patient is admitted for right total hip arthroplasty.  Subjective:  Chief Complaint: right hip pain  HPI: Beverly [name], 74 y.o. female, has a history of pain and functional disability in the right hip(s) due to arthritis and patient has failed non-surgical conservative treatments for greater than 12 weeks to include NSAID's and/or analgesics, corticosteriod injections, flexibility and strengthening excercises, use of assistive devices and activity modification.  Onset of symptoms was gradual starting 1 years ago with gradually worsening course since that time.The patient noted no past surgery on the right hip(s).  Patient currently rates pain in the right hip at 10 out of 10 with activity. Patient has night pain, worsening of pain with activity and weight bearing, pain that interfers with activities of daily living and pain with passive range of motion. Patient has evidence of subchondral cysts, subchondral sclerosis, periarticular osteophytes and joint space narrowing by imaging studies. This condition presents safety issues increasing the risk of falls.  There is no current active infection.  Patient Active Problem List   Diagnosis Date Noted  . 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 Primary osteoarthritis of left hip 12/07/2018  . Primary osteoarthritis of right hip 12/07/2018  . Pain of right hip joint 05/21/2017  . Trochanteric bursitis, right hip 04/23/2017  . Pain in right hip 04/23/2017  . Hearing loss of right ear due to cerumen impaction 10/25/2016  . Chronic rhinitis 07/04/2015  . Cough 07/04/2015  . Endometrial polyp 01/11/2011   Past Medical History:  Diagnosis Date  . Basal cell carcinoma    mid back  . Esophageal dysmotility   . GERD (gastroesophageal reflux disease)    occassional  . OA (osteoarthritis)    hips and knees  . Pneumonia    history of   . PONV (postoperative nausea and vomiting)     Past Surgical History:   Procedure Laterality Date  . COLONOSCOPY    . FRACTURE SURGERY  2009   left ankle  . LAPAROSCOPY  1992   infection after tubal  . SHOULDER OPEN ROTATOR CUFF REPAIR Right 2009  . TOTAL HIP ARTHROPLASTY Left 01/29/2019   Procedure: LEFT TOTAL HIP ARTHROPLASTY ANTERIOR APPROACH;  Surgeon: Mcarthur Rossetti, MD;  Location: [facility];  Service: Orthopedics;  Laterality: Left;  . TUBAL LIGATION  1992    No current facility-administered medications for this encounter.   Current Outpatient Medications  Medication Sig Dispense Refill Last Dose  . acetaminophen (TYLENOL) 650 MG CR tablet Take 1,300 mg by mouth every 8 (eight) hours as needed for pain.     . Ascorbic Acid (VITAMIN C) 1000 MG tablet Take 1,000 mg by mouth daily.     . bimatoprost (LATISSE) 0.03 % ophthalmic solution Place 1 drop into both eyes at bedtime. Place one drop on applicator and apply evenly along the skin of the upper eyelid at base of eyelashes once daily at bedtime; repeat procedure for second eye (use a clean applicator).     . Biotin 1000 MCG tablet Take 1,000 mcg by mouth daily.     . Calcium Carb-Cholecalciferol (CALCIUM 600 + D PO) Take 1 tablet by mouth daily.     . cephALEXin (KEFLEX) 250 MG capsule Take 250 mg by mouth at bedtime.     Marland Kitchen estradiol (VIVELLE-DOT) 0.025 MG/24HR Place 1 patch onto the skin 2 (two) times a week. [place]  (Wednesdays & Saturdays)     . ibuprofen (ADVIL) 200 MG tablet  Take 400 mg by mouth every 8 (eight) hours as needed.     Marland Kitchen Lysine 500 MG CAPS Take 500 mg by mouth daily.     . Multiple Vitamins-Minerals (MULTIVITAMIN WITH MINERALS) tablet Take 1 tablet by mouth daily.     Marland Kitchen OVER THE COUNTER MEDICATION Take 4-5 capsules by mouth at bedtime. [place] RHUBARB 400 mg/capsule     . pantoprazole (PROTONIX) 40 MG tablet TAKE 1 TABLET BY MOUTH DAILY.  'OV NEEDED FOR ADDITIONAL REFILLS" (Patient taking differently: Take 40 mg by mouth every evening. TAKE 1 TABLET BY MOUTH DAILY.  'OV NEEDED FOR  ADDITIONAL REFILLS") 30 tablet 0   . Probiotic Product (PROBIOTIC PO) Take 1 capsule by mouth daily. 5 Billion CFUs     . progesterone (PROMETRIUM) 100 MG capsule Take 100 mg by mouth every Monday, Wednesday, and Friday.      . pyridoxine (B-6) 100 MG tablet Take 100 mg by mouth daily.     . vitamin B-12 (CYANOCOBALAMIN) 1000 MCG tablet Take 1,000 mcg by mouth daily.     . methocarbamol (ROBAXIN) 500 MG tablet Take 1 tablet (500 mg total) by mouth every 6 (six) hours as needed for muscle spasms. 60 tablet 1   . oxyCODONE (ROXICODONE) 5 MG immediate release tablet Take 1 tablet (5 mg total) by mouth every 4 (four) hours as needed for severe pain. 30 tablet 0    Allergies  Allergen Reactions  . Macrodantin Hives  . Sulfa Antibiotics Palpitations    Social History   Tobacco Use  . Smoking status: Former Smoker    Quit date: 12/27/1979    Years since quitting: 39.7  . Smokeless tobacco: Never Used  Substance Use Topics  . Alcohol use: Yes    Alcohol/week: 3.0 standard drinks    Types: 3 Glasses of wine per week    Comment: weekly    Family History  Problem Relation Age of Onset  . Allergic rhinitis Neg Hx   . Angioedema Neg Hx   . Asthma Neg Hx   . Atopy Neg Hx   . Eczema Neg Hx   . Immunodeficiency Neg Hx   . Urticaria Neg Hx      Review of Systems  Musculoskeletal: Positive for joint swelling.  All other systems reviewed and are negative.   Objective:  Physical Exam  Constitutional: She is oriented to person, place, and time. She appears well-developed and well-nourished.  HENT:  Head: Normocephalic and atraumatic.  Eyes: Pupils are equal, round, and reactive to light. EOM are normal.  Cardiovascular: Normal rate.  Respiratory: Effort normal.  GI: Soft.  Musculoskeletal:     Cervical back: Normal range of motion and neck supple.     Right hip: Tenderness and bony tenderness present. Decreased range of motion. Decreased strength.  Neurological: She is alert and  oriented to person, place, and time.  Skin: Skin is warm and dry.  Psychiatric: She has a normal mood and affect.    Vital signs in last 24 hours:    Labs:   Estimated body mass index is 20.85 kg/m as calculated from the following:   Height as of 09/21/19: 5\' 7"  (1.702 m).   Weight as of 09/21/19: 60.4 kg.   Imaging Review Plain radiographs demonstrate severe degenerative joint disease of the right hip(s). The bone quality appears to be excellent for age and reported activity level.      Assessment/Plan:  End stage arthritis, right hip(s)  The patient history, physical  examination, clinical judgement of the provider and imaging studies are consistent with end stage degenerative joint disease of the right hip(s) and total hip arthroplasty is deemed medically necessary. The treatment options including medical management, injection therapy, arthroscopy and arthroplasty were discussed at length. The risks and benefits of total hip arthroplasty were presented and reviewed. The risks due to aseptic loosening, infection, stiffness, dislocation/subluxation,  thromboembolic complications and other imponderables were discussed.  The patient acknowledged the explanation, agreed to proceed with the plan and consent was signed. Patient is being admitted for inpatient treatment for surgery, pain control, PT, OT, prophylactic antibiotics, VTE prophylaxis, progressive ambulation and ADL's and discharge planning.The patient is planning to be discharged home with home health services

## 2019-09-23 NOTE — Telephone Encounter (Signed)
Ortho bundle pre-op call completed. 

## 2019-09-23 NOTE — Care Plan (Signed)
RNCM call to patient to discuss her upcoming Right THA with Dr. Ninfa Linden on 09/24/19. Patient had her Left THA done in August of 2020 as well and is familiar with Ortho bundle program. Reviewed briefly all post op instructions. Patient was able to ask relevant questions. She doesn't feel she will need HHPT after this hip surgery as she is familiar with all exercises. If changes her mind, will address with MD. Has all home DME and her husband will assist after discharge. F/U appointment already scheduled for 2 weeks. Will continue to monitor for CM needs. Will discuss with MD if pos-op medications can be sent into her pharmacy before surgery as she ran into problems with getting these filled last time over the weekend at discharge. Patient appreciative of call and assistance.

## 2019-09-24 ENCOUNTER — Observation Stay (HOSPITAL_COMMUNITY)
Admission: RE | Admit: 2019-09-24 | Discharge: 2019-09-25 | Disposition: A | Discharge status: Home / Self Care | Payer: Medicare Other | Source: Ambulatory Visit | Attending: Orthopaedic Surgery | Admitting: Orthopaedic Surgery

## 2019-09-24 ENCOUNTER — Observation Stay (HOSPITAL_COMMUNITY): Payer: Medicare Other

## 2019-09-24 ENCOUNTER — Other Ambulatory Visit: Payer: Self-pay

## 2019-09-24 ENCOUNTER — Ambulatory Visit (HOSPITAL_COMMUNITY): Payer: Medicare Other | Admitting: Anesthesiology

## 2019-09-24 ENCOUNTER — Ambulatory Visit (HOSPITAL_COMMUNITY): Payer: Medicare Other

## 2019-09-24 ENCOUNTER — Encounter (HOSPITAL_COMMUNITY)
Admission: RE | Disposition: A | Discharge status: Home / Self Care | Payer: Self-pay | Source: Ambulatory Visit | Attending: Orthopaedic Surgery

## 2019-09-24 ENCOUNTER — Encounter (HOSPITAL_COMMUNITY): Payer: Self-pay | Admitting: Orthopaedic Surgery

## 2019-09-24 DIAGNOSIS — M25751 Osteophyte, right hip: Secondary | ICD-10-CM | POA: Diagnosis not present

## 2019-09-24 DIAGNOSIS — N840 Polyp of corpus uteri: Secondary | ICD-10-CM | POA: Diagnosis not present

## 2019-09-24 DIAGNOSIS — Z96643 Presence of artificial hip joint, bilateral: Secondary | ICD-10-CM | POA: Diagnosis not present

## 2019-09-24 DIAGNOSIS — Z419 Encounter for procedure for purposes other than remedying health state, unspecified: Secondary | ICD-10-CM

## 2019-09-24 DIAGNOSIS — Z471 Aftercare following joint replacement surgery: Secondary | ICD-10-CM | POA: Diagnosis not present

## 2019-09-24 DIAGNOSIS — K219 Gastro-esophageal reflux disease without esophagitis: Secondary | ICD-10-CM | POA: Insufficient documentation

## 2019-09-24 DIAGNOSIS — Z85828 Personal history of other malignant neoplasm of skin: Secondary | ICD-10-CM | POA: Insufficient documentation

## 2019-09-24 DIAGNOSIS — M1611 Unilateral primary osteoarthritis, right hip: Secondary | ICD-10-CM | POA: Diagnosis not present

## 2019-09-24 DIAGNOSIS — Z882 Allergy status to sulfonamides status: Secondary | ICD-10-CM | POA: Diagnosis not present

## 2019-09-24 DIAGNOSIS — Z96641 Presence of right artificial hip joint: Secondary | ICD-10-CM | POA: Diagnosis not present

## 2019-09-24 DIAGNOSIS — Z87891 Personal history of nicotine dependence: Secondary | ICD-10-CM | POA: Insufficient documentation

## 2019-09-24 DIAGNOSIS — M8588 Other specified disorders of bone density and structure, other site: Secondary | ICD-10-CM | POA: Insufficient documentation

## 2019-09-24 DIAGNOSIS — Z96642 Presence of left artificial hip joint: Secondary | ICD-10-CM | POA: Insufficient documentation

## 2019-09-24 DIAGNOSIS — M1711 Unilateral primary osteoarthritis, right knee: Secondary | ICD-10-CM | POA: Diagnosis present

## 2019-09-24 DIAGNOSIS — M170 Bilateral primary osteoarthritis of knee: Secondary | ICD-10-CM | POA: Insufficient documentation

## 2019-09-24 DIAGNOSIS — M7061 Trochanteric bursitis, right hip: Secondary | ICD-10-CM | POA: Diagnosis not present

## 2019-09-24 HISTORY — PX: TOTAL HIP ARTHROPLASTY: SHX124

## 2019-09-24 SURGERY — ARTHROPLASTY, HIP, TOTAL, ANTERIOR APPROACH
Anesthesia: Spinal | Site: Hip | Laterality: Right

## 2019-09-24 MED ORDER — ONDANSETRON HCL 4 MG/2ML IJ SOLN: 4.0000 mg | Freq: Four times a day (QID) | INTRAMUSCULAR | Status: DC | PRN

## 2019-09-24 MED ORDER — TIZANIDINE HCL 4 MG PO TABS
4.0000 mg | ORAL_TABLET | Freq: Four times a day (QID) | ORAL | Status: DC | PRN
  Administered 2019-09-24 – 2019-09-25 (×4): 4 mg via ORAL
  Filled 2019-09-24 (×4): qty 1

## 2019-09-24 MED ORDER — MENTHOL 3 MG MT LOZG: 1.0000 | LOZENGE | OROMUCOSAL | Status: DC | PRN

## 2019-09-24 MED ORDER — DIPHENHYDRAMINE HCL 12.5 MG/5ML PO ELIX
12.5000 mg | ORAL_SOLUTION | ORAL | Status: DC | PRN
  Administered 2019-09-24: 25 mg via ORAL
  Filled 2019-09-24: qty 10

## 2019-09-24 MED ORDER — FENTANYL CITRATE (PF) 100 MCG/2ML IJ SOLN
INTRAMUSCULAR | Status: AC
  Administered 2019-09-24: 15:00:00 50 ug via INTRAVENOUS
  Filled 2019-09-24: qty 2

## 2019-09-24 MED ORDER — BIMATOPROST 0.03 % EX SOLN: 1.0000 [drp] | Freq: Every day | CUTANEOUS | Status: DC

## 2019-09-24 MED ORDER — OXYCODONE HCL 5 MG PO TABS: 10.0000 mg | ORAL_TABLET | ORAL | Status: DC | PRN

## 2019-09-24 MED ORDER — SODIUM CHLORIDE 0.9 % IR SOLN
Status: DC | PRN
  Administered 2019-09-24: 1000 mL

## 2019-09-24 MED ORDER — FENTANYL CITRATE (PF) 100 MCG/2ML IJ SOLN
25.0000 ug | INTRAMUSCULAR | Status: DC | PRN
  Administered 2019-09-24: 50 ug via INTRAVENOUS

## 2019-09-24 MED ORDER — OXYCODONE HCL 5 MG/5ML PO SOLN: 5.0000 mg | Freq: Once | ORAL | Status: DC | PRN

## 2019-09-24 MED ORDER — PROPOFOL 10 MG/ML IV BOLUS
INTRAVENOUS | Status: DC | PRN
  Administered 2019-09-24 (×2): 10 mg via INTRAVENOUS

## 2019-09-24 MED ORDER — KETOROLAC TROMETHAMINE 15 MG/ML IJ SOLN
7.5000 mg | Freq: Once | INTRAMUSCULAR | Status: AC
  Administered 2019-09-24: 7.5 mg via INTRAVENOUS
  Filled 2019-09-24: qty 1

## 2019-09-24 MED ORDER — LYSINE 500 MG PO CAPS: 500.0000 mg | ORAL_CAPSULE | Freq: Every day | ORAL | Status: DC

## 2019-09-24 MED ORDER — METOCLOPRAMIDE HCL 5 MG/ML IJ SOLN: 5.0000 mg | Freq: Three times a day (TID) | INTRAMUSCULAR | Status: DC | PRN

## 2019-09-24 MED ORDER — ONDANSETRON HCL 4 MG/2ML IJ SOLN: 4.0000 mg | Freq: Once | INTRAMUSCULAR | Status: DC | PRN

## 2019-09-24 MED ORDER — FENTANYL CITRATE (PF) 100 MCG/2ML IJ SOLN
INTRAMUSCULAR | Status: AC
  Filled 2019-09-24: qty 2

## 2019-09-24 MED ORDER — ALUM & MAG HYDROXIDE-SIMETH 200-200-20 MG/5ML PO SUSP: 30.0000 mL | ORAL | Status: DC | PRN

## 2019-09-24 MED ORDER — OXYCODONE HCL 5 MG PO TABS: 5.0000 mg | ORAL_TABLET | Freq: Once | ORAL | Status: DC | PRN

## 2019-09-24 MED ORDER — LACTATED RINGERS IV SOLN: INTRAVENOUS | Status: DC

## 2019-09-24 MED ORDER — POVIDONE-IODINE 10 % EX SWAB
2.0000 "application " | Freq: Once | CUTANEOUS | Status: AC
  Administered 2019-09-24: 2 via TOPICAL

## 2019-09-24 MED ORDER — SODIUM CHLORIDE 0.9 % IV SOLN: INTRAVENOUS | Status: DC

## 2019-09-24 MED ORDER — ONDANSETRON HCL 4 MG/2ML IJ SOLN
INTRAMUSCULAR | Status: DC | PRN
  Administered 2019-09-24: 4 mg via INTRAVENOUS

## 2019-09-24 MED ORDER — LIDOCAINE 2% (20 MG/ML) 5 ML SYRINGE
INTRAMUSCULAR | Status: DC | PRN
  Administered 2019-09-24: 50 mg via INTRAVENOUS

## 2019-09-24 MED ORDER — CEFAZOLIN SODIUM-DEXTROSE 1-4 GM/50ML-% IV SOLN
1.0000 g | Freq: Four times a day (QID) | INTRAVENOUS | Status: AC
  Administered 2019-09-24 – 2019-09-25 (×2): 1 g via INTRAVENOUS
  Filled 2019-09-24 (×2): qty 50

## 2019-09-24 MED ORDER — OXYCODONE HCL 5 MG PO TABS
ORAL_TABLET | ORAL | Status: AC
  Filled 2019-09-24: qty 2

## 2019-09-24 MED ORDER — DOCUSATE SODIUM 100 MG PO CAPS
100.0000 mg | ORAL_CAPSULE | Freq: Two times a day (BID) | ORAL | Status: DC
  Administered 2019-09-24 – 2019-09-25 (×2): 100 mg via ORAL
  Filled 2019-09-24 (×2): qty 1

## 2019-09-24 MED ORDER — HYDROMORPHONE HCL 1 MG/ML IJ SOLN: 0.5000 mg | INTRAMUSCULAR | Status: DC | PRN

## 2019-09-24 MED ORDER — MIDAZOLAM HCL 5 MG/5ML IJ SOLN
INTRAMUSCULAR | Status: DC | PRN
  Administered 2019-09-24: 2 mg via INTRAVENOUS

## 2019-09-24 MED ORDER — MEPERIDINE HCL 50 MG/ML IJ SOLN
6.2500 mg | INTRAMUSCULAR | Status: DC | PRN
  Administered 2019-09-24: 12.5 mg via INTRAVENOUS

## 2019-09-24 MED ORDER — DEXAMETHASONE SODIUM PHOSPHATE 10 MG/ML IJ SOLN
INTRAMUSCULAR | Status: DC | PRN
  Administered 2019-09-24: 6 mg via INTRAVENOUS

## 2019-09-24 MED ORDER — OXYCODONE HCL 5 MG PO TABS
5.0000 mg | ORAL_TABLET | ORAL | Status: DC | PRN
  Administered 2019-09-24: 10 mg via ORAL
  Administered 2019-09-24 (×2): 5 mg via ORAL
  Administered 2019-09-25 (×2): 10 mg via ORAL
  Administered 2019-09-25: 13:00:00 5 mg via ORAL
  Filled 2019-09-24: qty 2
  Filled 2019-09-24 (×2): qty 1
  Filled 2019-09-24: qty 2
  Filled 2019-09-24: qty 1

## 2019-09-24 MED ORDER — BUPIVACAINE IN DEXTROSE 0.75-8.25 % IT SOLN
INTRATHECAL | Status: DC | PRN
  Administered 2019-09-24: 1.8 mL via INTRATHECAL

## 2019-09-24 MED ORDER — CEPHALEXIN 250 MG PO CAPS
250.0000 mg | ORAL_CAPSULE | Freq: Every day | ORAL | Status: DC
  Filled 2019-09-24: qty 1

## 2019-09-24 MED ORDER — BIOTIN 1000 MCG PO TABS: 1000.0000 ug | ORAL_TABLET | Freq: Every day | ORAL | Status: DC

## 2019-09-24 MED ORDER — PROPOFOL 500 MG/50ML IV EMUL
INTRAVENOUS | Status: DC | PRN
  Administered 2019-09-24: 75 ug/kg/min via INTRAVENOUS

## 2019-09-24 MED ORDER — GABAPENTIN 100 MG PO CAPS
100.0000 mg | ORAL_CAPSULE | Freq: Three times a day (TID) | ORAL | Status: DC
  Administered 2019-09-24 – 2019-09-25 (×2): 100 mg via ORAL
  Filled 2019-09-24 (×2): qty 1

## 2019-09-24 MED ORDER — MEPERIDINE HCL 50 MG/ML IJ SOLN
INTRAMUSCULAR | Status: AC
  Filled 2019-09-24: qty 1

## 2019-09-24 MED ORDER — PROPOFOL 1000 MG/100ML IV EMUL
INTRAVENOUS | Status: AC
  Filled 2019-09-24: qty 100

## 2019-09-24 MED ORDER — HYDROMORPHONE HCL 1 MG/ML IJ SOLN
INTRAMUSCULAR | Status: AC
  Administered 2019-09-24: 1 mg via INTRAVENOUS
  Filled 2019-09-24: qty 1

## 2019-09-24 MED ORDER — METOCLOPRAMIDE HCL 5 MG PO TABS: 5.0000 mg | ORAL_TABLET | Freq: Three times a day (TID) | ORAL | Status: DC | PRN

## 2019-09-24 MED ORDER — PHENYLEPHRINE HCL-NACL 10-0.9 MG/250ML-% IV SOLN
INTRAVENOUS | Status: DC | PRN
  Administered 2019-09-24: 15 ug/min via INTRAVENOUS

## 2019-09-24 MED ORDER — VITAMIN B-12 1000 MCG PO TABS
1000.0000 ug | ORAL_TABLET | Freq: Every day | ORAL | Status: DC
  Administered 2019-09-25: 1000 ug via ORAL
  Filled 2019-09-24: qty 1

## 2019-09-24 MED ORDER — ACETAMINOPHEN 160 MG/5ML PO SOLN: 325.0000 mg | ORAL | Status: DC | PRN

## 2019-09-24 MED ORDER — PHENOL 1.4 % MT LIQD: 1.0000 | OROMUCOSAL | Status: DC | PRN

## 2019-09-24 MED ORDER — MIDAZOLAM HCL 2 MG/2ML IJ SOLN
INTRAMUSCULAR | Status: AC
  Filled 2019-09-24: qty 2

## 2019-09-24 MED ORDER — ACETAMINOPHEN 325 MG PO TABS: 325.0000 mg | ORAL_TABLET | ORAL | Status: DC | PRN

## 2019-09-24 MED ORDER — ASCORBIC ACID 500 MG PO TABS
1000.0000 mg | ORAL_TABLET | Freq: Every day | ORAL | Status: DC
  Administered 2019-09-25: 08:00:00 1000 mg via ORAL
  Filled 2019-09-24: qty 2

## 2019-09-24 MED ORDER — ONDANSETRON HCL 4 MG PO TABS
4.0000 mg | ORAL_TABLET | Freq: Four times a day (QID) | ORAL | Status: DC | PRN
  Administered 2019-09-25: 4 mg via ORAL
  Filled 2019-09-24: qty 1

## 2019-09-24 MED ORDER — STERILE WATER FOR IRRIGATION IR SOLN
Status: DC | PRN
  Administered 2019-09-24 (×2): 1000 mL

## 2019-09-24 MED ORDER — 0.9 % SODIUM CHLORIDE (POUR BTL) OPTIME
TOPICAL | Status: DC | PRN
  Administered 2019-09-24: 1000 mL

## 2019-09-24 MED ORDER — ADULT MULTIVITAMIN W/MINERALS CH
1.0000 | ORAL_TABLET | Freq: Every day | ORAL | Status: DC
  Administered 2019-09-25: 08:00:00 1 via ORAL
  Filled 2019-09-24: qty 1

## 2019-09-24 MED ORDER — PANTOPRAZOLE SODIUM 40 MG PO TBEC
40.0000 mg | DELAYED_RELEASE_TABLET | Freq: Every evening | ORAL | Status: DC
  Administered 2019-09-24: 40 mg via ORAL
  Filled 2019-09-24: qty 1

## 2019-09-24 MED ORDER — TRANEXAMIC ACID-NACL 1000-0.7 MG/100ML-% IV SOLN
1000.0000 mg | INTRAVENOUS | Status: AC
  Administered 2019-09-24: 1000 mg via INTRAVENOUS
  Filled 2019-09-24: qty 100

## 2019-09-24 MED ORDER — FENTANYL CITRATE (PF) 100 MCG/2ML IJ SOLN
INTRAMUSCULAR | Status: DC | PRN
  Administered 2019-09-24: 25 ug via INTRAVENOUS
  Administered 2019-09-24: 50 ug via INTRAVENOUS
  Administered 2019-09-24: 25 ug via INTRAVENOUS

## 2019-09-24 MED ORDER — CEFAZOLIN SODIUM-DEXTROSE 2-4 GM/100ML-% IV SOLN
2.0000 g | INTRAVENOUS | Status: AC
  Administered 2019-09-24: 2 g via INTRAVENOUS
  Filled 2019-09-24: qty 100

## 2019-09-24 MED ORDER — ACETAMINOPHEN 325 MG PO TABS
325.0000 mg | ORAL_TABLET | Freq: Four times a day (QID) | ORAL | Status: DC | PRN
  Administered 2019-09-25: 650 mg via ORAL
  Filled 2019-09-24: qty 2

## 2019-09-24 MED ORDER — ASPIRIN 81 MG PO CHEW
81.0000 mg | CHEWABLE_TABLET | Freq: Two times a day (BID) | ORAL | Status: DC
  Administered 2019-09-24 – 2019-09-25 (×2): 81 mg via ORAL
  Filled 2019-09-24 (×2): qty 1

## 2019-09-24 MED ORDER — VITAMIN B-6 100 MG PO TABS
100.0000 mg | ORAL_TABLET | Freq: Every day | ORAL | Status: DC
  Administered 2019-09-25: 100 mg via ORAL
  Filled 2019-09-24: qty 1

## 2019-09-24 SURGICAL SUPPLY — 38 items
BAG ZIPLOCK 12X15 (MISCELLANEOUS) IMPLANT
BENZOIN TINCTURE PRP APPL 2/3 (GAUZE/BANDAGES/DRESSINGS) IMPLANT
BLADE SAW SGTL 18X1.27X75 (BLADE) ×2 IMPLANT
COVER PERINEAL POST (MISCELLANEOUS) ×2 IMPLANT
COVER SURGICAL LIGHT HANDLE (MISCELLANEOUS) ×2 IMPLANT
COVER WAND RF STERILE (DRAPES) ×2 IMPLANT
CUP SECTOR GRIPTON 50MM (Cup) ×1 IMPLANT
DRAPE STERI IOBAN 125X83 (DRAPES) ×2 IMPLANT
DRAPE U-SHAPE 47X51 STRL (DRAPES) ×4 IMPLANT
DRSG AQUACEL AG ADV 3.5X10 (GAUZE/BANDAGES/DRESSINGS) ×2 IMPLANT
DURAPREP 26ML APPLICATOR (WOUND CARE) ×2 IMPLANT
ELECT REM PT RETURN 15FT ADLT (MISCELLANEOUS) ×2 IMPLANT
GAUZE XEROFORM 1X8 LF (GAUZE/BANDAGES/DRESSINGS) ×2 IMPLANT
GLOVE BIO SURGEON STRL SZ7.5 (GLOVE) ×2 IMPLANT
GLOVE BIOGEL PI IND STRL 8 (GLOVE) ×2 IMPLANT
GLOVE BIOGEL PI INDICATOR 8 (GLOVE) ×2
GLOVE ECLIPSE 8.0 STRL XLNG CF (GLOVE) ×2 IMPLANT
GOWN STRL REUS W/TWL XL LVL3 (GOWN DISPOSABLE) ×4 IMPLANT
HANDPIECE INTERPULSE COAX TIP (DISPOSABLE) ×1
HEAD FEM STD 32X+1 STRL (Hips) ×1 IMPLANT
HOLDER FOLEY CATH W/STRAP (MISCELLANEOUS) ×2 IMPLANT
KIT TURNOVER KIT A (KITS) IMPLANT
LINER ACETABULAR 32X50 (Liner) ×1 IMPLANT
PACK ANTERIOR HIP CUSTOM (KITS) ×2 IMPLANT
PENCIL SMOKE EVACUATOR (MISCELLANEOUS) IMPLANT
SET HNDPC FAN SPRY TIP SCT (DISPOSABLE) ×1 IMPLANT
STAPLER VISISTAT 35W (STAPLE) IMPLANT
STEM FEM SZ3 STD ACTIS (Stem) ×1 IMPLANT
STRIP CLOSURE SKIN 1/2X4 (GAUZE/BANDAGES/DRESSINGS) IMPLANT
SUT ETHIBOND NAB CT1 #1 30IN (SUTURE) ×2 IMPLANT
SUT ETHILON 2 0 PS N (SUTURE) IMPLANT
SUT MNCRL AB 4-0 PS2 18 (SUTURE) ×1 IMPLANT
SUT VIC AB 0 CT1 36 (SUTURE) ×2 IMPLANT
SUT VIC AB 1 CT1 36 (SUTURE) ×2 IMPLANT
SUT VIC AB 2-0 CT1 27 (SUTURE) ×2
SUT VIC AB 2-0 CT1 TAPERPNT 27 (SUTURE) ×2 IMPLANT
TRAY FOLEY MTR SLVR 16FR STAT (SET/KITS/TRAYS/PACK) IMPLANT
YANKAUER SUCT BULB TIP 10FT TU (MISCELLANEOUS) ×2 IMPLANT

## 2019-09-24 NOTE — Care Plan (Signed)
Ortho Bundle Case Management Note  Patient Details  Name: Beverly [name] MRN: [id number] Date of Birth: April 10, 1946  [facility] call to patient to discuss her upcoming Right THA with Dr. Ninfa Linden on 09/24/19. Patient had her Left THA done in August of 2020 as well and is familiar with Ortho bundle program. Reviewed briefly all post op instructions. Patient was able to ask relevant questions. She doesn't feel she will need HHPT after this hip surgery as she is familiar with all exercises. If changes her mind, will address with MD. Has all home DME and her husband will assist after discharge. F/U appointment already scheduled for 2 weeks. Will continue to monitor for CM needs.                         DME Arranged:  (NO DME NEEDED AS PATIENT HAD SURGERY IN 2020 AND RECEIVED NEEDED DME AT THAT TIME) DME Agency:     HH Arranged:    [place] Agency:  (Don't anticipate HHPT will be needed as patient feels she knows exercises and will not need for this hip)  Additional Comments: Please contact me with any questions of if this plan should need to change.  Jamse Arn, RN, BSN, [employer]  [phone] 09/24/2019, 1:25 PM

## 2019-09-24 NOTE — Progress Notes (Signed)
Patient ID: Beverly [name], female   DOB: [birth year], 74 y.o.   MRN: [id number] The patient understands fully that she is here for a right hip replacement surgery.  There has been no acute change in her medical status.  See current and recent H&P.  Having had surgery before with a hip replacement on her other side she is fully aware of the risk and benefits of the surgery.  Informed consent is obtained in the right hip has been marked.

## 2019-09-24 NOTE — Anesthesia Procedure Notes (Signed)
Spinal  Patient location during procedure: OR Start time: 09/24/2019 12:53 PM End time: 09/24/2019 12:57 PM Staffing Anesthesiologist: Janeece Riggers, MD Preanesthetic Checklist Completed: patient identified, IV checked, site marked, risks and benefits discussed, surgical consent, monitors and equipment checked, pre-op evaluation and timeout performed Spinal Block Patient position: sitting Prep: DuraPrep Patient monitoring: heart rate, cardiac monitor, continuous pulse ox and blood pressure Approach: midline Location: L3-4 Injection technique: single-shot Needle Needle type: Sprotte  Needle gauge: 24 G Needle length: 9 cm Assessment Sensory level: T4

## 2019-09-24 NOTE — Brief Op Note (Signed)
09/24/2019  2:09 PM  PATIENT:  Beverly [name]  74 y.o. female  PRE-OPERATIVE DIAGNOSIS:  RIGHT HIP OSTEOARTHRITIS  POST-OPERATIVE DIAGNOSIS:  RIGHT HIP OSTEOARTHRITIS  PROCEDURE:  Procedure(s): RIGHT TOTAL HIP ARTHROPLASTY ANTERIOR APPROACH (Right)  SURGEON:  Surgeon(s) and Role:    Mcarthur Rossetti, MD - Primary  PHYSICIAN ASSISTANT:  Benita Stabile, PA-C  ANESTHESIA:   spinal  EBL:  200 mL   COUNTS:  YES  DICTATION: .Other Dictation: Dictation Number [id number]  PLAN OF CARE: Admit for overnight observation  PATIENT DISPOSITION:  PACU - hemodynamically stable.   Delay start of Pharmacological VTE agent (>24hrs) due to surgical blood loss or risk of bleeding: no

## 2019-09-24 NOTE — Transfer of Care (Signed)
Immediate Anesthesia Transfer of Care Note  Patient: Beverly [name]  Procedure(s) Performed: Procedure(s): RIGHT TOTAL HIP ARTHROPLASTY ANTERIOR APPROACH (Right)  Patient Location: PACU  Anesthesia Type:Spinal  Level of Consciousness:  sedated, patient cooperative and responds to stimulation  Airway & Oxygen Therapy:Patient Spontanous Breathing and Patient connected to face mask oxgen  Post-op Assessment:  Report given to PACU RN and Post -op Vital signs reviewed and stable  Post vital signs:  Reviewed and stable  Last Vitals:  Vitals:   09/24/19 1017 09/24/19 1432  BP: (!) 154/93 (!) 150/81  Pulse: 84 63  Resp: 16 16  Temp: 36.9 C (P) 36.5 C  SpO2: 123XX123 123XX123    Complications: No apparent anesthesia complications

## 2019-09-24 NOTE — Op Note (Signed)
NAMEMAGARET, [name] MEDICAL RECORD [id number] ACCOUNT [id number] DATE OF BIRTH:[birth year] FACILITY: [facility] LOCATION: [facility] PHYSICIAN:Khalis Hittle [place], MD  OPERATIVE REPORT  DATE OF PROCEDURE:  09/24/2019  PREOPERATIVE DIAGNOSIS:  Primary osteoarthritis and degenerative joint disease, right hip.  POSTOPERATIVE DIAGNOSIS:  Primary osteoarthritis and degenerative joint disease, right hip.  PROCEDURE:  Right total hip arthroplasty through direct anterior approach.  IMPLANTS:  DePuy Sector Gription acetabular component size 50, size 32+0 neutral polyethylene liner, size 3 Actis femoral component with standard offset, size 32+1 metal hip ball.  SURGEON:  Lind Guest. Ninfa Linden, MD  ASSISTANT:  Erskine Emery, PA-C  ANESTHESIA:  Spinal.  ANTIBIOTICS:  2 grams IV Ancef.  ESTIMATED BLOOD LOSS:  200-300 mL.  COMPLICATIONS:  None.  INDICATIONS:  The patient is a 74 year old active female well known to me.  She has a history of severe osteoarthritis and degenerative joint disease of her right hip.  Her pain has become daily and is detrimentally affecting her activities of daily  living, quality of life and her mobility.  She actually underwent a successful left total hip arthroplasty through direct anterior approach the last year.  At this point, her pain has become daily.  It is detrimentally affecting her mobility, her quality  of life to the point that she does wish to proceed with total hip arthroplasty on the right side.  Having had this before she is fully aware of the risk of acute blood loss anemia, nerve or vessel injury, fracture, infection, dislocation, DVT and  implant failure.  She understands our goals are to decrease pain, improve mobility and overall improve quality of life.  DESCRIPTION OF PROCEDURE:  After informed consent was obtained and appropriate right hip was marked she was brought to the operating room and spinal anesthesia was obtained while she was on a  stretcher.  A Foley catheter was placed.  I assessed her leg  length, which was found to be equal.  Traction boots were placed on both her feet.  Next, she was placed supine on the Hana fracture table, the perineal post in place.  Both legs in line skeletal traction device and no traction applied.  Her right  operative hip was prepped and draped with DuraPrep and sterile drapes.  A timeout was called to identify correct patient, correct right hip.  I then made an incision just inferior and posterior to the anterior superior iliac spine and carried this  obliquely down the leg.  We dissected down tensor fascia lata muscle.  Tensor fascia was then divided longitudinally to proceed with direct anterior approach to the hip.  We identified and cauterized circumflex vessels and identified the hip capsule,  opened the hip capsule in an L-type format, finding moderate joint effusion and significant synovitis around the hip joint.  There were periarticular osteophytes noted as well.  I placed Cobra retractors around the medial and lateral femoral neck and  made our femoral neck cut with an oscillating saw and completed this with an osteotome.  We placed a corkscrew guide in the femoral head and removed the femoral head in its entirety and found it to be devoid of cartilage over a wide area.  I then placed  a bent Hohmann over the medial acetabular rim and removed remnants of the acetabular labrum and other debris.  I then began reaming under direct visualization from a size 44 reamer in stepwise increments up to a size 49.  With all reamers under direct  visualization, the last reamer also  placed under direct fluoroscopy, so we could obtain our depth of reaming, our inclination and anteversion.  I then placed the real DePuy Sector Gription acetabular component size 50 and a 32+0 neutral polyethylene  liner for that size acetabular component.  Attention was then turned to the femur.  With the leg externally rotated to  120 degrees, extended and adducted, we were able to place a Mueller retractor medially and a Hohmann retractor above the greater  trochanter.  We released lateral joint capsule and used a box-cutting osteotome to enter the femoral canal and a rongeur to lateralize then began broaching using the Actis broaching system from a size 0 up to a size 3.  With a size 3 in place, we trialed  a standard offset femoral neck and a 32+1 hip ball.  We reduced this in the acetabulum.  We were pleased with the leg length, offset, range of motion and stability assessed radiographically and mechanically.  We then dislocated the hip and removed the  trial components.  I placed the real standard offset Actis femoral component size 3 and the real 32+1 metal hip ball again reduced this in the acetabulum and it was stable.  We then irrigated the soft tissue with normal saline solution using pulsatile  lavage.  We were able to close the joint capsule with interrupted #1 Ethibond suture, followed by closing the tensor fascia with #1 Vicryl.  0 Vicryl was used to close deep tissue and 2-0 Vicryl was used to close subcutaneous tissue.  4-0 Monocryl  subcuticular stitch was then placed to reapproximate the skin.  Steri-Strips were applied.  Aquacel dressing was placed and she was taken off the Hana table and taken to recovery room in stable condition.  All final counts were correct.  There were no  complications noted.  Of note, Benita Stabile, PA-C did assist during the entire case and his assistance was crucial for facilitating all aspects of this case.  CN/NUANCE  D:09/24/2019 T:09/24/2019 JOB:[id number]

## 2019-09-24 NOTE — Anesthesia Preprocedure Evaluation (Addendum)
Anesthesia Evaluation  Patient identified by MRN, date of birth, ID band Patient awake    Reviewed: Allergy & Precautions, NPO status , Patient's Chart, lab work & pertinent test results  History of Anesthesia Complications (+) PONV and history of anesthetic complications  Airway Mallampati: I  TM Distance: >3 FB Neck ROM: Full    Dental  (+) Teeth Intact, Dental Advisory Given   Pulmonary former smoker,    breath sounds clear to auscultation       Cardiovascular negative cardio ROS   Rhythm:Regular Rate:Normal     Neuro/Psych negative neurological ROS  negative psych ROS   GI/Hepatic Neg liver ROS, GERD  Medicated,  Endo/Other  negative endocrine ROS  Renal/GU negative Renal ROS     Musculoskeletal  (+) Arthritis ,   Abdominal Normal abdominal exam  (+)   Peds  Hematology negative hematology ROS (+)   Anesthesia Other Findings   Reproductive/Obstetrics                            Anesthesia Physical  Anesthesia Plan  ASA: II  Anesthesia Plan: Spinal and MAC   Post-op Pain Management:    Induction: Intravenous  PONV Risk Score and Plan: 4 or greater and Ondansetron, Dexamethasone, Propofol infusion and Treatment may vary due to age or medical condition  Airway Management Planned: Natural Airway and Simple Face Mask  Additional Equipment: None  Intra-op Plan:   Post-operative Plan:   Informed Consent: I have reviewed the patients History and Physical, chart, labs and discussed the procedure including the risks, benefits and alternatives for the proposed anesthesia with the patient or authorized representative who has indicated his/her understanding and acceptance.       Plan Discussed with: CRNA  Anesthesia Plan Comments:        Anesthesia Quick Evaluation

## 2019-09-24 NOTE — Evaluation (Signed)
Physical Therapy Evaluation Patient Details Name: Beverly [name] MRN: [id number] DOB: September 02, 1945 Today's Date: 09/24/2019   History of Present Illness  Patient is 74 y.o. female s/p Rt THA anterior approach on 09/24/19 with PMH significant for OA, GERD, Lt THA on 01/29/19, Rt RCR in 2009.     Clinical Impression  Beverly [name] is a 74 y.o. female POD 0 s/p Rt THA. Patient reports independence with mobility at baseline. Patient is now limited by functional impairments (see PT problem list below) and requires min assist for bed mob and transfers with RW. Patient was limited to stand step transfer with min-mod assist due to some weakness at proximal hips limiting balance. Patient instructed in exercise to facilitate circulation. Patient will benefit from continued skilled PT interventions to address impairments and progress towards PLOF. Acute PT will follow to progress mobility and stair training in preparation for safe discharge home.     Follow Up Recommendations Follow surgeon's recommendation for DC plan and follow-up therapies;Home health PT    Equipment Recommendations  None recommended by PT    Recommendations for Other Services       Precautions / Restrictions Precautions Precautions: Fall Restrictions Weight Bearing Restrictions: No      Mobility  Bed Mobility Overal bed mobility: Needs Assistance Bed Mobility: Supine to Sit     Supine to sit: Min assist;HOB elevated     General bed mobility comments: cues for using bed rail and assist for Rt LE mobility, pt able to raise trunk.  Transfers Overall transfer level: Needs assistance Equipment used: Rolling walker (2 wheeled) Transfers: Sit to/from [facility] Sit to Stand: Min assist;From elevated surface;Mod assist Stand pivot transfers: Min assist;From elevated surface;Mod assist       General transfer comment: cues for technique for power up and assist to steady with rising. Pt with significant anterior  lean on standing and required cues to shift weight onto heels. Min-mod assist for walker management and cues for step pattern. pt reported soem nausea after sitting down.  Ambulation/Gait       Stairs     Wheelchair Mobility    Modified Rankin (Stroke Patients Only)       Balance Overall balance assessment: Needs assistance Sitting-balance support: Feet supported Sitting balance-Leahy Scale: Good     Standing balance support: During functional activity;Bilateral upper extremity supported Standing balance-Leahy Scale: Poor          Pertinent Vitals/Pain Pain Assessment: 0-10 Pain Score: 3  Pain Location: Rt hip Pain Descriptors / Indicators: Aching;Discomfort Pain Intervention(s): Limited activity within patient's tolerance;Monitored during session;Repositioned;Ice applied    Home Living Family/patient expects to be discharged to:: Private residence Living Arrangements: Spouse/significant other Available Help at Discharge: Family Type of Home: House Home Access: Stairs to enter Entrance Stairs-Rails: None Entrance Stairs-Number of Steps: 2 Home Layout: Two level;Full bath on main level;Able to live on main level with bedroom/bathroom Home Equipment: Gilford Rile - 2 wheels;Cane - single point;Bedside commode;Crutches      Prior Function Level of Independence: Independent               Hand Dominance   Dominant Hand: Right    Extremity/Trunk Assessment   Upper Extremity Assessment Upper Extremity Assessment: Overall WFL for tasks assessed    Lower Extremity Assessment Lower Extremity Assessment: Generalized weakness(pt slightly weak at proximal hips)    Cervical / Trunk Assessment Cervical / Trunk Assessment: Normal  Communication   Communication: No difficulties  Cognition Arousal/Alertness: Awake/alert Behavior During  Therapy: WFL for tasks assessed/performed Overall Cognitive Status: Within Functional Limits for tasks assessed            General Comments      Exercises Total Joint Exercises Ankle Circles/Pumps: AROM;Both;20 reps;Seated   Assessment/Plan    PT Assessment Patient needs continued PT services  PT Problem List Decreased strength;Decreased range of motion;Decreased activity tolerance;Decreased balance;Decreased mobility;Decreased knowledge of use of DME;Decreased knowledge of precautions;Pain       PT Treatment Interventions DME instruction;Gait training;Stair training;Functional mobility training;Therapeutic activities;Therapeutic exercise;Balance training;Patient/family education    PT Goals (Current goals can be found in the Care Plan section)  Acute Rehab PT Goals Patient Stated Goal: to get back to indepedence with walking, hiking, golfing PT Goal Formulation: With patient Time For Goal Achievement: 10/01/19 Potential to Achieve Goals: Good    Frequency 7X/week    AM-PAC PT "6 Clicks" Mobility  Outcome Measure Help needed turning from your back to your side while in a flat bed without using bedrails?: A Little Help needed moving from lying on your back to sitting on the side of a flat bed without using bedrails?: A Little Help needed moving to and from a bed to a chair (including a wheelchair)?: A Little Help needed standing up from a chair using your arms (e.g., wheelchair or bedside chair)?: A Little Help needed to walk in hospital room?: A Lot Help needed climbing 3-5 steps with a railing? : A Lot 6 Click Score: 16    End of Session Equipment Utilized During Treatment: Gait belt Activity Tolerance: Patient tolerated treatment well Patient left: in chair;with call bell/phone within reach;with chair alarm set;with family/visitor present Nurse Communication: Mobility status PT Visit Diagnosis: Muscle weakness (generalized) (M62.81);Difficulty in walking, not elsewhere classified (R26.2);Pain Pain - Right/Left: Right Pain - part of body: Hip    Time: 1735-1800 PT Time Calculation (min)  (ACUTE ONLY): 25 min   Charges:   PT Evaluation $PT Eval Low Complexity: 1 Low         Verner Mould, DPT Physical Therapist with [facility] [phone]  09/24/2019 7:24 PM

## 2019-09-25 DIAGNOSIS — M1611 Unilateral primary osteoarthritis, right hip: Secondary | ICD-10-CM | POA: Diagnosis not present

## 2019-09-25 DIAGNOSIS — Z96642 Presence of left artificial hip joint: Secondary | ICD-10-CM | POA: Diagnosis not present

## 2019-09-25 DIAGNOSIS — Z87891 Personal history of nicotine dependence: Secondary | ICD-10-CM | POA: Diagnosis not present

## 2019-09-25 DIAGNOSIS — M25751 Osteophyte, right hip: Secondary | ICD-10-CM | POA: Diagnosis not present

## 2019-09-25 DIAGNOSIS — Z85828 Personal history of other malignant neoplasm of skin: Secondary | ICD-10-CM | POA: Diagnosis not present

## 2019-09-25 DIAGNOSIS — M8588 Other specified disorders of bone density and structure, other site: Secondary | ICD-10-CM | POA: Diagnosis not present

## 2019-09-25 LAB — CBC
HCT: 32.1 % — ABNORMAL LOW (ref 36.0–46.0)
Hemoglobin: 10.4 g/dL — ABNORMAL LOW (ref 12.0–15.0)
MCH: 28.3 pg (ref 26.0–34.0)
MCHC: 32.4 g/dL (ref 30.0–36.0)
MCV: 87.5 fL (ref 80.0–100.0)
Platelets: 171 10*3/uL (ref 150–400)
RBC: 3.67 MIL/uL — ABNORMAL LOW (ref 3.87–5.11)
RDW: 12.4 % (ref 11.5–15.5)
WBC: 8.8 10*3/uL (ref 4.0–10.5)
nRBC: 0 % (ref 0.0–0.2)

## 2019-09-25 LAB — BASIC METABOLIC PANEL
Anion gap: 7 (ref 5–15)
BUN: 11 mg/dL (ref 8–23)
CO2: 26 mmol/L (ref 22–32)
Calcium: 8.1 mg/dL — ABNORMAL LOW (ref 8.9–10.3)
Chloride: 101 mmol/L (ref 98–111)
Creatinine, Ser: 0.75 mg/dL (ref 0.44–1.00)
GFR calc Af Amer: >60 mL/min (ref >60)
GFR calc non Af Amer: >60 mL/min (ref >60)
Glucose, Bld: 142 mg/dL — ABNORMAL HIGH (ref 70–99)
Potassium: 4.2 mmol/L (ref 3.5–5.1)
Sodium: 134 mmol/L — ABNORMAL LOW (ref 135–145)

## 2019-09-25 MED ORDER — TIZANIDINE HCL 4 MG PO TABS: 4.0000 mg | ORAL_TABLET | Freq: Four times a day (QID) | ORAL | 0 refills | Status: DC | PRN

## 2019-09-25 NOTE — Discharge Instructions (Signed)

## 2019-09-25 NOTE — Progress Notes (Signed)
Subjective: 1 Day Post-Op Procedure(s) (LRB): RIGHT TOTAL HIP ARTHROPLASTY ANTERIOR APPROACH (Right) Patient reports pain as moderate.    Objective: Vital signs in last 24 hours: Temp:  [97.7 F (36.5 C)-99.1 F (37.3 C)] 99.1 F (37.3 C) (04/24 0938) Pulse Rate:  [53-81] 81 (04/24 0938) Resp:  [10-18] 16 (04/24 0938) BP: (122-172)/(65-112) 134/75 (04/24 0938) SpO2:  [98 %-100 %] 100 % (04/24 0938)  Intake/Output from previous day: 04/23 0701 - 04/24 0700 In: 3201.8 [I.V.:3150; IV Piggyback:51.8] Out: 3375 [Urine:3175; Blood:200] Intake/Output this shift: Total I/O In: 1398.4 [P.O.:840; I.V.:558.4] Out: 1000 [Urine:1000]  Recent Labs    09/25/19 0341  HGB 10.4*   Recent Labs    09/25/19 0341  WBC 8.8  RBC 3.67*  HCT 32.1*  PLT 171   Recent Labs    09/25/19 0341  NA 134*  K 4.2  CL 101  CO2 26  BUN 11  CREATININE 0.75  GLUCOSE 142*  CALCIUM 8.1*   No results for input(s): LABPT, INR in the last 72 hours.  Sensation intact distally Intact pulses distally Dorsiflexion/Plantar flexion intact Incision: dressing C/D/I   Assessment/Plan: 1 Day Post-Op Procedure(s) (LRB): RIGHT TOTAL HIP ARTHROPLASTY ANTERIOR APPROACH (Right) Up with therapy Discharge home with home health    Patient's anticipated LOS is less than 2 midnights, meeting these requirements: - Younger than 72 - Lives within 1 hour of care - Has a competent adult at home to recover with post-op recover - NO history of  - Chronic pain requiring opiods  - Diabetes  - Coronary Artery Disease  - Heart failure  - Heart attack  - Stroke  - DVT/VTE  - Cardiac arrhythmia  - Respiratory Failure/COPD  - Renal failure  - Anemia  - Advanced Liver disease       Mcarthur Rossetti 09/25/2019, 1:47 PM

## 2019-09-25 NOTE — TOC Progression Note (Signed)
Transition of Care [facility]) - Progression Note    Patient Details  Name: Fergie Dushaj MRN: [id number] Date of Birth: June 06, 1945  [facility] CM/SW Contact  Joaquin Courts, RN Phone Number: 09/25/2019, 10:48 AM  Clinical Narrative:    CM spoke with patient at bedside.  Patient states that she does not want [place] services and feels that she can manage at home and complete exercises on her own.  Patient reports she had hip surgery on her other hip in the past and did not feel that [facility] was beneficial and she is familiar with the exercises she needs to complete from that experience.  Patient reports she has rolling walker and 3in1 at home.   Expected Discharge Plan: [place] Barriers to Discharge: No Barriers Identified  Expected Discharge Plan and Services Expected Discharge Plan: [place]   Discharge Planning Services: CM Consult   Living arrangements for the past 2 months: Single Family Home                 DME Arranged: N/A DME Agency: NA       HH Arranged: Refused [place] Agency: (Don't anticipate HHPT will be needed as patient feels she knows exercises and will not need for this hip)         Social Determinants of Health (SDOH) Interventions    Readmission Risk Interventions No flowsheet data found.

## 2019-09-25 NOTE — Plan of Care (Signed)
  Problem: Clinical Measurements: Goal: Respiratory complications will improve Outcome: Progressing   Problem: Clinical Measurements: Goal: Cardiovascular complication will be avoided Outcome: Progressing   Problem: Nutrition: Goal: Adequate nutrition will be maintained Outcome: Progressing   Problem: Coping: Goal: Level of anxiety will decrease Outcome: Progressing   Problem: Elimination: Goal: Will not experience complications related to bowel motility Outcome: Progressing   Problem: Pain Managment: Goal: General experience of comfort will improve Outcome: Progressing

## 2019-09-25 NOTE — Discharge Summary (Signed)
Patient ID: Beverly [name] MRN: [id number] DOB/AGE: 10/27/1945 74 y.o.  Admit date: 09/24/2019 Discharge date: 09/25/2019  Admission Diagnoses:  Principal Problem:   Primary osteoarthritis of right hip Active Problems:   Status post total replacement of right hip   Discharge Diagnoses:  Same  Past Medical History:  Diagnosis Date  . Basal cell carcinoma    mid back  . Esophageal dysmotility   . GERD (gastroesophageal reflux disease)    occassional  . OA (osteoarthritis)    hips and knees  . Pneumonia    history of   . PONV (postoperative nausea and vomiting)     Surgeries: Procedure(s): RIGHT TOTAL HIP ARTHROPLASTY ANTERIOR APPROACH on 09/24/2019   Consultants:   Discharged Condition: Improved  Hospital Course: Beverly [name] is an 74 y.o. female who was admitted 09/24/2019 for operative treatment ofPrimary osteoarthritis of right hip. Patient has severe unremitting pain that affects sleep, daily activities, and work/hobbies. After pre-op clearance the patient was taken to the operating room on 09/24/2019 and underwent  Procedure(s): RIGHT TOTAL HIP ARTHROPLASTY ANTERIOR APPROACH.    Patient was given perioperative antibiotics:  Anti-infectives (From admission, onward)   Start     Dose/Rate Route Frequency Ordered Stop   09/25/19 2200  cephALEXin (KEFLEX) capsule 250 mg     250 mg Oral Daily at bedtime 09/24/19 1631     09/24/19 2000  ceFAZolin (ANCEF) IVPB 1 g/50 mL premix     1 g 100 mL/hr over 30 Minutes Intravenous Every 6 hours 09/24/19 1631 09/25/19 0228   09/24/19 1030  ceFAZolin (ANCEF) IVPB 2g/100 mL premix     2 g 200 mL/hr over 30 Minutes Intravenous On call to O.R. 09/24/19 1020 09/24/19 1254       Patient was given sequential compression devices, early ambulation, and chemoprophylaxis to prevent DVT.  Patient benefited maximally from hospital stay and there were no complications.    Recent vital signs:  Patient Vitals for the past 24 hrs:  BP Temp Temp  src Pulse Resp SpO2  09/25/19 0938 134/75 99.1 F (37.3 C) -- 81 16 100 %  09/25/19 0514 122/75 98.3 F (36.8 C) Oral 76 16 100 %  09/25/19 0119 127/79 98 F (36.7 C) Oral 65 16 99 %  09/24/19 2102 (!) 150/79 97.9 F (36.6 C) Oral 63 16 100 %  09/24/19 1934 139/65 98 F (36.7 C) Oral 65 16 98 %  09/24/19 1828 (!) 164/70 97.7 F (36.5 C) Oral 66 18 100 %  09/24/19 1734 (!) 169/91 98.4 F (36.9 C) Oral 63 17 100 %  09/24/19 1634 (!) 167/88 -- -- 63 14 100 %  09/24/19 1615 (!) 163/77 -- -- (!) 57 13 100 %  09/24/19 1600 (!) 167/76 -- -- (!) 59 10 100 %  09/24/19 1545 (!) 172/90 -- -- 67 18 100 %  09/24/19 1540 -- -- -- 72 13 100 %  09/24/19 1530 (!) 166/88 -- -- (!) 56 10 100 %  09/24/19 1515 135/71 -- -- 64 16 98 %  09/24/19 1500 131/82 -- -- (!) 53 11 100 %  09/24/19 1458 137/66 -- -- 66 12 100 %  09/24/19 1445 (!) 162/112 -- -- 62 18 100 %  09/24/19 1432 (!) 150/81 97.7 F (36.5 C) -- 63 16 100 %     Recent laboratory studies:  Recent Labs    09/25/19 0341  WBC 8.8  HGB 10.4*  HCT 32.1*  PLT 171  NA 134*  K 4.2  CL 101  CO2 26  BUN 11  CREATININE 0.75  GLUCOSE 142*  CALCIUM 8.1*     Discharge Medications:   Allergies as of 09/25/2019      Reactions   Macrodantin Hives   Sulfa Antibiotics Palpitations      Medication List    TAKE these medications   acetaminophen 650 MG CR tablet Commonly known as: TYLENOL Take 1,300 mg by mouth every 8 (eight) hours as needed for pain.   bimatoprost 0.03 % ophthalmic solution Commonly known as: LATISSE Place 1 drop into both eyes at bedtime. Place one drop on applicator and apply evenly along the skin of the upper eyelid at base of eyelashes once daily at bedtime; repeat procedure for second eye (use a clean applicator).   Biotin 1000 MCG tablet Take 1,000 mcg by mouth daily.   CALCIUM 600 + D PO Take 1 tablet by mouth daily.   cephALEXin 250 MG capsule Commonly known as: KEFLEX Take 250 mg by mouth at  bedtime.   estradiol 0.025 MG/24HR Commonly known as: VIVELLE-DOT Place 1 patch onto the skin 2 (two) times a week. ALORA  (Wednesdays & Saturdays)   ibuprofen 200 MG tablet Commonly known as: ADVIL Take 400 mg by mouth every 8 (eight) hours as needed.   Lysine 500 MG Caps Take 500 mg by mouth daily.   methocarbamol 500 MG tablet Commonly known as: ROBAXIN Take 1 tablet (500 mg total) by mouth every 6 (six) hours as needed for muscle spasms.   multivitamin with minerals tablet Take 1 tablet by mouth daily.   OVER THE COUNTER MEDICATION Take 4-5 capsules by mouth at bedtime. [place] RHUBARB 400 mg/capsule   oxyCODONE 5 MG immediate release tablet Commonly known as: Roxicodone Take 1 tablet (5 mg total) by mouth every 4 (four) hours as needed for severe pain.   pantoprazole 40 MG tablet Commonly known as: PROTONIX TAKE 1 TABLET BY MOUTH DAILY.  'OV NEEDED FOR ADDITIONAL REFILLS" What changed:   how much to take  how to take this  when to take this   PROBIOTIC PO Take 1 capsule by mouth daily. 5 Billion CFUs   progesterone 100 MG capsule Commonly known as: PROMETRIUM Take 100 mg by mouth every Monday, Wednesday, and Friday.   pyridoxine 100 MG tablet Commonly known as: B-6 Take 100 mg by mouth daily.   tiZANidine 4 MG tablet Commonly known as: ZANAFLEX Take 1 tablet (4 mg total) by mouth every 6 (six) hours as needed for muscle spasms.   vitamin B-12 1000 MCG tablet Commonly known as: CYANOCOBALAMIN Take 1,000 mcg by mouth daily.   vitamin C 1000 MG tablet Take 1,000 mg by mouth daily.            Durable Medical Equipment  (From admission, onward)         Start     Ordered   09/24/19 1632  DME 3 n 1  Once     09/24/19 1631   09/24/19 1632  DME Walker rolling  Once    Question Answer Comment  Walker: With 5 Inch Wheels   Patient needs a walker to treat with the following condition Status post total replacement of right hip      09/24/19 1631           Diagnostic Studies: DG Pelvis Portable  Result Date: 09/24/2019 CLINICAL DATA:  74 year old female status post right hip arthroplasty. EXAM: PORTABLE PELVIS 1-2 VIEWS COMPARISON:  Earlier radiograph dated  09/24/2019. FINDINGS: Bilateral total hip arthroplasties. The arthroplasty components appear intact and in anatomic alignment. There is no acute fracture or dislocation. The bones are osteopenic. Postsurgical changes and air in the soft tissues of the right hip. IMPRESSION: Bilateral total hip arthroplasties. No complication. Electronically Signed   By: Anner Crete M.D.   On: 09/24/2019 16:43   DG C-Arm 1-60 Min-No Report  Result Date: 09/24/2019 Fluoroscopy was utilized by the requesting physician.  No radiographic interpretation.   DG HIP OPERATIVE UNILAT W OR W/O PELVIS RIGHT  Result Date: 09/24/2019 CLINICAL DATA:  Intra op right anterior hip replacement EXAM: OPERATIVE right HIP (WITH PELVIS IF PERFORMED) 2 VIEWS TECHNIQUE: Fluoroscopic spot image(s) were submitted for interpretation post-operatively. COMPARISON:  01/29/2019 FINDINGS: Three fluoroscopic images are obtained during the performance of the procedure and are provided for interpretation only. Right total hip arthroplasty has been placed, in the expected position with no signs of complication. Stable left hip arthroplasty. FLUOROSCOPY TIME:  13 seconds IMPRESSION: 1. Intraoperative exam as above. Electronically Signed   By: Randa Ngo M.D.   On: 09/24/2019 16:01    Disposition: Discharge disposition: 01-Home or Self Care         Follow-up Information    Mcarthur Rossetti, MD. Go on 10/07/2019.   Specialty: Orthopedic Surgery Why: at 1:00 pm for your first in office post op visit with Dr. Clarita Leber information: [address] [place] [address] [phone]            Signed: Mcarthur Rossetti 09/25/2019, 1:48 PM

## 2019-09-25 NOTE — Progress Notes (Signed)
Physical Therapy Treatment Patient Details Name: [name] MRN: [id number] DOB: 1945/06/07 Today's Date: 09/25/2019    History of Present Illness 74 y.o. female s/p Rt THA anterior approach on 09/24/19 with PMH significant for OA, GERD, Lt THA on 01/29/19, Rt RCR in 2009.    PT Comments    Progressing with mobility. Pt is hopeful to d/c home later today if cleared by surgeon to do so. Will plan to have a 2nd session to continue gait and stair training. Will issue a HEP for pt to perform at home since she stated she does not want HHPT f/u.     Follow Up Recommendations  Follow surgeon's recommendation for DC plan and follow-up therapies(pt prefers home with HEP instead of HHPT f/u)     Equipment Recommendations  None recommended by PT    Recommendations for Other Services       Precautions / Restrictions Precautions Precautions: Fall Restrictions Weight Bearing Restrictions: No Other Position/Activity Restrictions: WBAT    Mobility  Bed Mobility               General bed mobility comments: oob in recliner  Transfers Overall transfer level: Needs assistance Equipment used: Rolling walker (2 wheeled) Transfers: Sit to/from Stand Sit to Stand: Min guard         General transfer comment: Close guard for safety.  Ambulation/Gait Ambulation/Gait assistance: Min guard Gait Distance (Feet): 200 Feet Assistive device: Rolling walker (2 wheeled) Gait Pattern/deviations: Step-through pattern;Decreased stride length     General Gait Details: close guard for safety. No LOB with RW use or lightheadedness/dizziness   Stairs             Wheelchair Mobility    Modified Rankin (Stroke Patients Only)       Balance Overall balance assessment: Mild deficits observed, not formally tested         Standing balance support: Bilateral upper extremity supported Standing balance-Leahy Scale: Fair                              Cognition  Arousal/Alertness: Awake/alert Behavior During Therapy: WFL for tasks assessed/performed Overall Cognitive Status: Within Functional Limits for tasks assessed                                        Exercises Total Joint Exercises Ankle Circles/Pumps: AROM;Both;10 reps Quad Sets: AROM;Both;10 reps Heel Slides: AAROM;Right;10 reps;AROM Hip ABduction/ADduction: AAROM;Right;AROM;10 reps    General Comments        Pertinent Vitals/Pain Pain Assessment: 0-10 Pain Score: 6  Pain Location: R hip/groin Pain Descriptors / Indicators: Sore;Discomfort;Aching Pain Intervention(s): Monitored during session;Ice applied;Repositioned    Home Living                      Prior Function            PT Goals (current goals can now be found in the care plan section) Progress towards PT goals: Progressing toward goals    Frequency    7X/week      PT Plan Current plan remains appropriate    Co-evaluation              AM-PAC PT "6 Clicks" Mobility   Outcome Measure  Help needed turning from your back to your side while in a flat bed without using bedrails?:  A Little Help needed moving from lying on your back to sitting on the side of a flat bed without using bedrails?: A Little Help needed moving to and from a bed to a chair (including a wheelchair)?: A Little Help needed standing up from a chair using your arms (e.g., wheelchair or bedside chair)?: A Little Help needed to walk in hospital room?: A Little Help needed climbing 3-5 steps with a railing? : A Little 6 Click Score: 18    End of Session Equipment Utilized During Treatment: Gait belt Activity Tolerance: Patient tolerated treatment well Patient left: in chair;with call bell/phone within reach   PT Visit Diagnosis: Other abnormalities of gait and mobility (R26.89) Pain - Right/Left: Right Pain - part of body: Hip     Time: [id number] PT Time Calculation (min) (ACUTE ONLY): 23  min  Charges:  $Gait Training: 8-22 mins $Therapeutic Exercise: 8-22 mins                        Doreatha Massed, PT Acute Rehabilitation

## 2019-09-25 NOTE — Progress Notes (Signed)
Physical Therapy Treatment Patient Details Name: Beverly [name] MRN: [id number] DOB: 02/09/1946 Today's Date: 09/25/2019    History of Present Illness 74 y.o. female s/p Rt THA anterior approach on 09/24/19 with PMH significant for OA, GERD, Lt THA on 01/29/19, Rt RCR in 2009.    PT Comments    Progressing with mobility. Reviewed practiced exercises, gait training, and stair training. Issued HEP for pt to perform 2x/day until surgeon instructs her otherwise. All education completed. Okay to d/c from PT standpoint.    Follow Up Recommendations  Follow surgeon's recommendation for DC plan and follow-up therapies(pt prefers home with HEP instead of HHPT f/u)     Equipment Recommendations  Rolling walker with 5" wheels    Recommendations for Other Services       Precautions / Restrictions Precautions Precautions: Fall Restrictions Weight Bearing Restrictions: No Other Position/Activity Restrictions: WBAT    Mobility  Bed Mobility               General bed mobility comments: oob in recliner  Transfers Overall transfer level: Needs assistance Equipment used: Rolling walker (2 wheeled) Transfers: Sit to/from Stand Sit to Stand: Min guard         General transfer comment: Close guard for safety.  Ambulation/Gait Ambulation/Gait assistance: Min guard Gait Distance (Feet): 150 Feet Assistive device: Rolling walker (2 wheeled) Gait Pattern/deviations: Step-through pattern;Decreased stride length     General Gait Details: close guard for safety. No LOB with RW use or lightheadedness/dizziness   Stairs Stairs: Yes Stairs assistance: Min assist Stair Management: Step to pattern;Forwards;With walker Number of Stairs: 2 General stair comments: VCs safety, technique, sequence. 1 HHAssist provided and walker was placed at top of stairs for pt to utilize as able. Pt used RW to descend stairs (with therapist stabilizing walker).   Wheelchair Mobility    Modified Rankin  (Stroke Patients Only)       Balance Overall balance assessment: Mild deficits observed, not formally tested         Standing balance support: Bilateral upper extremity supported Standing balance-Leahy Scale: Fair                              Cognition Arousal/Alertness: Awake/alert Behavior During Therapy: WFL for tasks assessed/performed Overall Cognitive Status: Within Functional Limits for tasks assessed                                        Exercises Total Joint Exercises Ankle Circles/Pumps: AROM;Both;10 reps Quad Sets: AROM;Both;10 reps Heel Slides: AAROM;Right;10 reps;AROM Hip ABduction/ADduction: AROM;Right;10 reps;Standing Knee Flexion: AROM;Right;10 reps;Standing Marching in Standing: AROM;Both;10 reps;Standing General Exercises - Lower Extremity Heel Raises: AROM;Both;10 reps;Standing    General Comments        Pertinent Vitals/Pain Pain Assessment: 0-10 Pain Score: 6  Pain Location: R hip/groin/thigh Pain Descriptors / Indicators: Sore;Discomfort;Aching Pain Intervention(s): Monitored during session;Ice applied;Repositioned    Home Living                      Prior Function            PT Goals (current goals can now be found in the care plan section) Progress towards PT goals: Progressing toward goals    Frequency    7X/week      PT Plan Current plan remains appropriate    Co-evaluation  AM-PAC PT "6 Clicks" Mobility   Outcome Measure  Help needed turning from your back to your side while in a flat bed without using bedrails?: A Little Help needed moving from lying on your back to sitting on the side of a flat bed without using bedrails?: A Little Help needed moving to and from a bed to a chair (including a wheelchair)?: A Little Help needed standing up from a chair using your arms (e.g., wheelchair or bedside chair)?: A Little Help needed to walk in hospital room?: A  Little Help needed climbing 3-5 steps with a railing? : A Little 6 Click Score: 18    End of Session Equipment Utilized During Treatment: Gait belt Activity Tolerance: Patient tolerated treatment well Patient left: in chair;with call bell/phone within reach;with family/visitor present   PT Visit Diagnosis: Other abnormalities of gait and mobility (R26.89) Pain - Right/Left: Right Pain - part of body: Hip     Time: [id number] PT Time Calculation (min) (ACUTE ONLY): 16 min  Charges:  $Gait Training: 8-22 mins $Therapeutic Exercise: 8-22 mins                        Doreatha Massed, PT Acute Rehabilitation

## 2019-09-25 NOTE — Plan of Care (Signed)
Pt stable at this time. Pt to d/c home with family when nursing staff is ready. No needs at this time. Pt dressing dry and intact.

## 2019-09-25 NOTE — Progress Notes (Signed)
Pt stable at time of d/c. No questions on discharge instructions and education given. Family at bedside to hear instructions.

## 2019-09-26 NOTE — Anesthesia Postprocedure Evaluation (Signed)
Anesthesia Post Note  Patient: Beverly [name]  Procedure(s) Performed: RIGHT TOTAL HIP ARTHROPLASTY ANTERIOR APPROACH (Right Hip)     Patient location during evaluation: PACU Anesthesia Type: Spinal Level of consciousness: oriented and awake and alert Pain management: pain level controlled Vital Signs Assessment: post-procedure vital signs reviewed and stable Respiratory status: spontaneous breathing, respiratory function stable and patient connected to nasal cannula oxygen Cardiovascular status: blood pressure returned to baseline and stable Postop Assessment: no headache, no backache and no apparent nausea or vomiting Anesthetic complications: no    Last Vitals:  Vitals:   09/25/19 0938 09/25/19 1408  BP: 134/75 133/68  Pulse: 81 76  Resp: 16 18  Temp: 37.3 C 36.9 C  SpO2: 100% 100%    Last Pain:  Vitals:   09/25/19 1257  TempSrc:   PainSc: 7                  Rossy Virag

## 2019-09-27 ENCOUNTER — Telehealth: Payer: Self-pay | Admitting: *Deleted

## 2019-09-27 ENCOUNTER — Encounter: Payer: Self-pay | Admitting: *Deleted

## 2019-09-27 NOTE — Telephone Encounter (Signed)
Attempted Ortho bundle D/C call to check status after discharge from hospital on 09/25/19 over the weekend.

## 2019-09-28 ENCOUNTER — Telehealth: Payer: Self-pay | Admitting: *Deleted

## 2019-09-28 NOTE — Telephone Encounter (Signed)
2nd D/C call attempt. Patient did return CM's call yesterday and left a VM stating she was doing well; re-attempt today to speak with her and left VM requesting call back if she has questions or needs.

## 2019-09-29 ENCOUNTER — Telehealth: Payer: Self-pay | Admitting: *Deleted

## 2019-09-29 NOTE — Telephone Encounter (Signed)
Ortho bundle call completed. 

## 2019-10-01 ENCOUNTER — Telehealth: Payer: Self-pay | Admitting: *Deleted

## 2019-10-01 NOTE — Telephone Encounter (Signed)
Pt. Called me just now stating she is overall feeling terrible that last 2 days. She was ambulating a lot around the house and doing overall well last time I spoke with her. Now-Low grade fever in afternoons, headache, malaise and nauseated. Her hip area she reports is much more swollen than her other hip and she feels its reddened, but hasn't removed dressing to look at incision line. No apparent drainage. She states she didn't feel comfortable doing it herself and looking at the incision. Muscle spasms increased and she is back to taking Robaxin as she couldn't tolerate the Tizanidine (she felt this increased HR and caused palpitations). Taking just Tylenol and Ibuprofen alternating for pain. She is 1 week post-op today. She takes Keflex (just 250mg ) at bedtime to prevent bladder infections per her PCP. Noted that in the hospital her catheter was twisted in 2 spots and when nurse "unkinked" she released a lot of urine into catheter. She states, "Something is just not right". Any other recommendations?

## 2019-10-01 NOTE — Telephone Encounter (Signed)
I think you calling her will make her feel better. She keeps saying she thinks she has an infection. Let me know and I can get her rescheduled for Monday instead of Thursday next week. Thanks.

## 2019-10-01 NOTE — Telephone Encounter (Signed)
I spoke with her and this made her feels better.

## 2019-10-01 NOTE — Telephone Encounter (Signed)
I am really not sure what to recommend at all.  I am in surgery all day and certainly can call her later.  We could always see her Monday first thing in the office.

## 2019-10-07 ENCOUNTER — Ambulatory Visit (INDEPENDENT_AMBULATORY_CARE_PROVIDER_SITE_OTHER): Payer: Medicare Other | Admitting: Orthopaedic Surgery

## 2019-10-07 ENCOUNTER — Encounter: Payer: Self-pay | Admitting: Orthopaedic Surgery

## 2019-10-07 ENCOUNTER — Other Ambulatory Visit: Payer: Self-pay

## 2019-10-07 DIAGNOSIS — Z96641 Presence of right artificial hip joint: Secondary | ICD-10-CM

## 2019-10-07 NOTE — Progress Notes (Signed)
The patient is 13 days status post a right total hip arthroplasty.  She has been experience frequent replacements having her left hip replaced.  She is well ahead of what most people are at this point.  She did forget to take her aspirin twice a day but she denies any foot and ankle swelling and denies any calf pain.  She is already walking about a mile a day.  She is only taken Tylenol and alternating with ibuprofen.  On examination laying supine her leg lengths are equal.  Her right hip incision looks good.  There is mild to moderate seroma but it is not worrisome to her or aggravating soft tissues that she would rather have it just absorb on its own and not aspirated.  Overall she looks great and is making great progress.  This point I will see her back in 4 weeks to see how she is doing overall.  If there is any issues before then she knows to let us know.  No x-rays are needed at her next visit.  She can drop from my standpoint as well.

## 2019-10-08 ENCOUNTER — Telehealth: Payer: Self-pay | Admitting: *Deleted

## 2019-10-08 NOTE — Telephone Encounter (Signed)
14 day ortho bundle call completed. 

## 2019-10-18 DIAGNOSIS — E7800 Pure hypercholesterolemia, unspecified: Secondary | ICD-10-CM | POA: Diagnosis not present

## 2019-10-18 DIAGNOSIS — Z87440 Personal history of urinary (tract) infections: Secondary | ICD-10-CM | POA: Diagnosis not present

## 2019-10-18 DIAGNOSIS — R1310 Dysphagia, unspecified: Secondary | ICD-10-CM | POA: Diagnosis not present

## 2019-10-18 DIAGNOSIS — K5900 Constipation, unspecified: Secondary | ICD-10-CM | POA: Diagnosis not present

## 2019-10-18 DIAGNOSIS — Z0000 Encounter for general adult medical examination without abnormal findings: Secondary | ICD-10-CM | POA: Diagnosis not present

## 2019-10-18 DIAGNOSIS — Z1211 Encounter for screening for malignant neoplasm of colon: Secondary | ICD-10-CM | POA: Diagnosis not present

## 2019-10-18 DIAGNOSIS — G4700 Insomnia, unspecified: Secondary | ICD-10-CM | POA: Diagnosis not present

## 2019-10-18 DIAGNOSIS — Z23 Encounter for immunization: Secondary | ICD-10-CM | POA: Diagnosis not present

## 2019-10-18 DIAGNOSIS — Z1389 Encounter for screening for other disorder: Secondary | ICD-10-CM | POA: Diagnosis not present

## 2019-10-27 ENCOUNTER — Telehealth: Payer: Self-pay | Admitting: *Deleted

## 2019-10-27 NOTE — Telephone Encounter (Signed)
30 day Ortho bundle call completed.

## 2019-11-04 ENCOUNTER — Other Ambulatory Visit: Payer: Self-pay

## 2019-11-04 ENCOUNTER — Ambulatory Visit (INDEPENDENT_AMBULATORY_CARE_PROVIDER_SITE_OTHER): Payer: Medicare Other | Admitting: Orthopaedic Surgery

## 2019-11-04 ENCOUNTER — Encounter: Payer: Self-pay | Admitting: Orthopaedic Surgery

## 2019-11-04 DIAGNOSIS — Z96641 Presence of right artificial hip joint: Secondary | ICD-10-CM

## 2019-11-04 NOTE — Progress Notes (Signed)
The patient is now 6-week status post a right total hip arthroplasty.  We also had replaced her left hip remotely.  She is doing well overall.  She is very active 74 years old.  She still wants to feel less stiffness in her hips.  She is walking about 3 miles a day and plans to play golf next week.  She says the swelling has gone down.  On exam she is walking without an assistive device.  Her leg lengths are equal.  Both hips have fluid and full range of motion.  At this point she will continue increase her activities as comfort allows.  I would like to see her back in 6 months from now.  At that visit I would like just a standing low AP pelvis.

## 2019-12-27 DIAGNOSIS — N3020 Other chronic cystitis without hematuria: Secondary | ICD-10-CM | POA: Diagnosis not present

## 2019-12-27 DIAGNOSIS — N952 Postmenopausal atrophic vaginitis: Secondary | ICD-10-CM | POA: Diagnosis not present

## 2019-12-29 ENCOUNTER — Telehealth: Payer: Self-pay | Admitting: *Deleted

## 2019-12-29 NOTE — Telephone Encounter (Signed)
Ortho bundle 90 day call completed. 

## 2020-01-24 DIAGNOSIS — Z01419 Encounter for gynecological examination (general) (routine) without abnormal findings: Secondary | ICD-10-CM | POA: Diagnosis not present

## 2020-01-24 DIAGNOSIS — Z124 Encounter for screening for malignant neoplasm of cervix: Secondary | ICD-10-CM | POA: Diagnosis not present

## 2020-01-24 DIAGNOSIS — Z79890 Hormone replacement therapy (postmenopausal): Secondary | ICD-10-CM | POA: Diagnosis not present

## 2020-02-02 ENCOUNTER — Telehealth: Payer: Self-pay | Admitting: *Deleted

## 2020-02-02 NOTE — Telephone Encounter (Signed)
1 year Ortho bundle completed for Left hip.

## 2020-02-24 ENCOUNTER — Ambulatory Visit (INDEPENDENT_AMBULATORY_CARE_PROVIDER_SITE_OTHER): Payer: Medicare Other | Admitting: Dermatology

## 2020-02-24 ENCOUNTER — Encounter: Payer: Self-pay | Admitting: Dermatology

## 2020-02-24 ENCOUNTER — Other Ambulatory Visit: Payer: Self-pay

## 2020-02-24 DIAGNOSIS — L578 Other skin changes due to chronic exposure to nonionizing radiation: Secondary | ICD-10-CM

## 2020-02-24 DIAGNOSIS — Z85828 Personal history of other malignant neoplasm of skin: Secondary | ICD-10-CM

## 2020-02-24 DIAGNOSIS — L820 Inflamed seborrheic keratosis: Secondary | ICD-10-CM | POA: Diagnosis not present

## 2020-02-24 DIAGNOSIS — L821 Other seborrheic keratosis: Secondary | ICD-10-CM

## 2020-02-24 NOTE — Progress Notes (Signed)
   Follow-Up Visit   Subjective  Beverly [name] is a 74 y.o. female who presents for the following: Hx of BCC, check scar (L chest parasternal, 33m f/u); other (recheck spot L breast); and check spots (L clavicle, L arm, scaly spots).  The following portions of the chart were reviewed this encounter and updated as appropriate:  Tobacco  Allergies  Meds  Problems  Med Hx  Surg Hx  Fam Hx     Review of Systems:  No other skin or systemic complaints except as noted in HPI or Assessment and Plan.  Objective  Well appearing patient in no apparent distress; mood and affect are within normal limits.  A focused examination was performed including chest, L arm. Relevant physical exam findings are noted in the Assessment and Plan.  Objective  L chest parasternal: Thickened scar  Objective  Left Breast x 1, L wrist x 1, L tricep x 1, L clavicle x 3 (6): Erythematous keratotic or waxy stuck-on papule or plaque.    Assessment & Plan  History of basal cell carcinoma (BCC) with development of hypertrophic scar L chest parasternal Clear, observe Discussed treatment options for scar IL injection, topical steroid, serica gel Samples of Serica given to pt  Inflamed seborrheic keratosis (6) Left Breast x 1, L wrist x 1, L tricep x 1, L clavicle x 3  Destruction of lesion - Left Breast x 1, L wrist x 1, L tricep x 1, L clavicle x 3 Complexity: simple   Destruction method: cryotherapy   Informed consent: discussed and consent obtained   Timeout:  patient name, date of birth, surgical site, and procedure verified Lesion destroyed using liquid nitrogen: Yes   Region frozen until ice ball extended beyond lesion: Yes   Outcome: patient tolerated procedure well with no complications   Post-procedure details: wound care instructions given     Actinic Damage - diffuse scaly erythematous macules with underlying dyspigmentation - Recommend daily broad spectrum sunscreen SPF 30+ to sun-exposed  areas, reapply every 2 hours as needed.  - Call for new or changing lesions.  Seborrheic Keratoses - Stuck-on, waxy, tan-brown papules and plaques  - Discussed benign etiology and prognosis. - Observe - Call for any changes  Return in about 6 months (around 08/23/2020) for TBSE.  I, Othelia Pulling, RMA, am acting as scribe for Sarina Ser, MD .  Documentation: I have reviewed the above documentation for accuracy and completeness, and I agree with the above.  Sarina Ser, MD

## 2020-03-31 DIAGNOSIS — Z23 Encounter for immunization: Secondary | ICD-10-CM | POA: Diagnosis not present

## 2020-04-02 DIAGNOSIS — H1011 Acute atopic conjunctivitis, right eye: Secondary | ICD-10-CM | POA: Diagnosis not present

## 2020-04-04 DIAGNOSIS — H2513 Age-related nuclear cataract, bilateral: Secondary | ICD-10-CM | POA: Diagnosis not present

## 2020-04-04 DIAGNOSIS — H5203 Hypermetropia, bilateral: Secondary | ICD-10-CM | POA: Diagnosis not present

## 2020-04-04 DIAGNOSIS — H52203 Unspecified astigmatism, bilateral: Secondary | ICD-10-CM | POA: Diagnosis not present

## 2020-04-04 DIAGNOSIS — H524 Presbyopia: Secondary | ICD-10-CM | POA: Diagnosis not present

## 2020-05-04 ENCOUNTER — Ambulatory Visit: Payer: Medicare Other | Admitting: Orthopaedic Surgery

## 2020-05-11 ENCOUNTER — Ambulatory Visit (INDEPENDENT_AMBULATORY_CARE_PROVIDER_SITE_OTHER): Payer: Medicare Other | Admitting: Dermatology

## 2020-05-11 ENCOUNTER — Other Ambulatory Visit: Payer: Self-pay

## 2020-05-11 DIAGNOSIS — H01131 Eczematous dermatitis of right upper eyelid: Secondary | ICD-10-CM | POA: Diagnosis not present

## 2020-05-11 DIAGNOSIS — Z96641 Presence of right artificial hip joint: Secondary | ICD-10-CM

## 2020-05-11 DIAGNOSIS — M2242 Chondromalacia patellae, left knee: Secondary | ICD-10-CM

## 2020-05-11 DIAGNOSIS — Z96642 Presence of left artificial hip joint: Secondary | ICD-10-CM | POA: Diagnosis not present

## 2020-05-11 DIAGNOSIS — M2241 Chondromalacia patellae, right knee: Secondary | ICD-10-CM

## 2020-05-11 MED ORDER — PIMECROLIMUS 1 % EX CREA: TOPICAL_CREAM | Freq: Two times a day (BID) | CUTANEOUS | 1 refills | Status: DC

## 2020-05-11 NOTE — Progress Notes (Signed)
   Follow-Up Visit   Subjective  Beverly [name] is a 74 y.o. female who presents for the following: check spot (R medial upper eyelid ~3m, scaly, no symptoms).  The following portions of the chart were reviewed this encounter and updated as appropriate:   Tobacco  Allergies  Meds  Problems  Med Hx  Surg Hx  Fam Hx     Review of Systems:  No other skin or systemic complaints except as noted in HPI or Assessment and Plan.  Objective  Well appearing patient in no apparent distress; mood and affect are within normal limits.  A focused examination was performed including face. Relevant physical exam findings are noted in the Assessment and Plan.  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 Start Elidel cream qd/bid until clear, then prn flares  Discussed if persistent, will recommend True Patch test 36  pimecrolimus (ELIDEL) 1 % cream - Right upper eyelid  Return for TBSE as scheduled.   I, Othelia Pulling, RMA, am acting as scribe for Sarina Ser, MD .  Documentation: I have reviewed the above documentation for accuracy and completeness, and I agree with the above.  Sarina Ser, MD

## 2020-05-15 ENCOUNTER — Ambulatory Visit (INDEPENDENT_AMBULATORY_CARE_PROVIDER_SITE_OTHER): Payer: Medicare Other | Admitting: Orthopaedic Surgery

## 2020-05-15 ENCOUNTER — Ambulatory Visit: Payer: Self-pay

## 2020-05-15 ENCOUNTER — Encounter: Payer: Self-pay | Admitting: Orthopaedic Surgery

## 2020-05-15 DIAGNOSIS — M2242 Chondromalacia patellae, left knee: Secondary | ICD-10-CM

## 2020-05-15 DIAGNOSIS — M2241 Chondromalacia patellae, right knee: Secondary | ICD-10-CM

## 2020-05-15 DIAGNOSIS — Z96642 Presence of left artificial hip joint: Secondary | ICD-10-CM

## 2020-05-15 DIAGNOSIS — Z96641 Presence of right artificial hip joint: Secondary | ICD-10-CM

## 2020-05-15 NOTE — Progress Notes (Signed)
The patient is now 9 months status post a right total hip arthroplasty in 16 months status post a left total hip arthroplasty both through an anterior approach.  She states he is doing well on both sides.  She still working on strength as well as flexibility and range of motion.  She feels like she is doing quite well.  Examination both hips show the move smoothly and fluidly.  She has excellent range of motion of both hips.  There is some slight stiffness on the left side comparing the left and right sides.  Her leg lengths are equal.  A standing AP pelvis shows well-seated implants bilaterally of her hips with no complicating features.  At this point follow-up can be as needed for her hips.  However, she has been having some knee issues for some time with laterally tracking patellas.  She is never had any dislocation but used to report significant grinding in her knees.  I did examine both patellas and I do slightly track laterally.  There is some slight patellofemoral chondromalacia grinding on exam.  She is definitely interested in outpatient physical therapy to see if they can strengthen the VMO on both quads and work with patella tracking.  I will then see her back in 6 weeks and at that visit I would like 3 views of both knees including sunrise view.

## 2020-05-16 ENCOUNTER — Other Ambulatory Visit: Payer: Self-pay

## 2020-05-16 DIAGNOSIS — M2242 Chondromalacia patellae, left knee: Secondary | ICD-10-CM

## 2020-05-16 DIAGNOSIS — M2241 Chondromalacia patellae, right knee: Secondary | ICD-10-CM

## 2020-05-17 ENCOUNTER — Encounter: Payer: Self-pay | Admitting: Dermatology

## 2020-05-19 DIAGNOSIS — Z23 Encounter for immunization: Secondary | ICD-10-CM | POA: Diagnosis not present

## 2020-06-02 ENCOUNTER — Other Ambulatory Visit: Payer: Self-pay | Admitting: Dermatology

## 2020-06-05 NOTE — Telephone Encounter (Signed)
Opened in error

## 2020-06-06 ENCOUNTER — Ambulatory Visit: Payer: Medicare Other | Admitting: Physical Therapy

## 2020-06-07 ENCOUNTER — Other Ambulatory Visit: Payer: Self-pay

## 2020-06-07 ENCOUNTER — Telehealth: Payer: Self-pay

## 2020-06-07 MED ORDER — BIMATOPROST 0.03 % EX SOLN: 1.0000 "application " | Freq: Every day | CUTANEOUS | 6 refills | Status: DC

## 2020-06-07 NOTE — Telephone Encounter (Signed)
Note says schedule for patch test if persistent problems. Please schedule for Patch test x 36 May continue Elidel.

## 2020-06-07 NOTE — Telephone Encounter (Signed)
Patient called regarding rash on eyelids you seen her for two weeks ago. She used the Elidel for two weeks with great improvement but once stopped the rash as started back and spreading. She stated you mentioned further testing. Please advise.

## 2020-06-07 NOTE — Progress Notes (Signed)
Pharmacy stating they still don;t have RFs on Bimatoprost.

## 2020-06-12 NOTE — Telephone Encounter (Signed)
Patient advised of information per Dr. Nehemiah Massed. She has been scheduled to start patch testing.

## 2020-06-16 ENCOUNTER — Ambulatory Visit (INDEPENDENT_AMBULATORY_CARE_PROVIDER_SITE_OTHER): Payer: Medicare Other | Admitting: Rehabilitative and Restorative Service Providers"

## 2020-06-16 ENCOUNTER — Encounter: Payer: Self-pay | Admitting: Rehabilitative and Restorative Service Providers"

## 2020-06-16 ENCOUNTER — Other Ambulatory Visit: Payer: Self-pay

## 2020-06-16 DIAGNOSIS — M6281 Muscle weakness (generalized): Secondary | ICD-10-CM | POA: Diagnosis not present

## 2020-06-16 DIAGNOSIS — R262 Difficulty in walking, not elsewhere classified: Secondary | ICD-10-CM

## 2020-06-16 DIAGNOSIS — M25562 Pain in left knee: Secondary | ICD-10-CM

## 2020-06-16 DIAGNOSIS — M25561 Pain in right knee: Secondary | ICD-10-CM | POA: Diagnosis not present

## 2020-06-16 DIAGNOSIS — G8929 Other chronic pain: Secondary | ICD-10-CM | POA: Diagnosis not present

## 2020-06-16 NOTE — Patient Instructions (Signed)
Access Code: [email or web address] URL: [email or web address] Date: 06/16/2020 Prepared by: Scot Jun  Exercises Small Range Straight Leg Raise - 2 x daily - 7 x weekly - 10 reps - 3 sets Supine Heel Slide - 2 x daily - 7 x weekly - 10 reps - 3 sets - 2 hold Supine Bridge - 2 x daily - 7 x weekly - 10 reps - 3 sets - 2 hold Seated Straight Leg Heel Taps - 2 x daily - 7 x weekly - 3 sets - 10 reps Sit to Stand - 2 x daily - 7 x weekly - 3 sets - 10 reps

## 2020-06-16 NOTE — Therapy (Signed)
[place], [place], [address] Phone: [phone]   Fax:  [phone]  Physical Therapy Evaluation  Patient Details  Name: Amarisa Perrette MRN: [id number] Date of Birth: [birth year] Referring Provider (PT): Dr. Ninfa Linden   Encounter Date: 06/16/2020   PT End of Session - 06/16/20 0840    Visit Number 1    Number of Visits 12    Date for PT Re-Evaluation 08/11/20    Progress Note Due on Visit 10    PT Start Time 0846    PT Stop Time 0920    PT Time Calculation (min) 34 min    Activity Tolerance Patient tolerated treatment well    Behavior During Therapy [facility] for tasks assessed/performed           Past Medical History:  Diagnosis Date  . Atypical mole 11/03/2013   R prox post lat thigh   . Basal cell carcinoma    mid back  . Basal cell carcinoma 04/01/2019   L chest parasternal   . Basal cell carcinoma 09/11/2017   L mid back at braline 3.0 cm lat to spine  . Esophageal dysmotility   . GERD (gastroesophageal reflux disease)    occassional  . OA (osteoarthritis)    hips and knees  . Pneumonia    history of   . PONV (postoperative nausea and vomiting)     Past Surgical History:  Procedure Laterality Date  . COLONOSCOPY    . FRACTURE SURGERY  2009   left ankle  . LAPAROSCOPY  1992   infection after tubal  . SHOULDER OPEN ROTATOR CUFF REPAIR Right 2009  . TOTAL HIP ARTHROPLASTY Left 01/29/2019   Procedure: LEFT TOTAL HIP ARTHROPLASTY ANTERIOR APPROACH;  Surgeon: Mcarthur Rossetti, MD;  Location: [facility];  Service: Orthopedics;  Laterality: Left;  . TOTAL HIP ARTHROPLASTY Right 09/24/2019   Procedure: RIGHT TOTAL HIP ARTHROPLASTY ANTERIOR APPROACH;  Surgeon: Mcarthur Rossetti, MD;  Location: [facility];  Service: Orthopedics;  Laterality: Right;  . TUBAL LIGATION  1992    There were no vitals filed for this visit.    Subjective Assessment - 06/16/20 0851    Subjective Pt. 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  Discussed with MD about referral for physical therapy to help improve knee strength    Pertinent History History of bilateral THA replacement, history of polo as 1 year.    Patient Stated Goals Reduce pain    Currently in Pain? Yes    Pain Score 6    at worst   Pain Location Knee    Pain Orientation Left;Right    Pain Descriptors / Indicators Tightness;Sharp    Pain Type Chronic pain    Pain Onset More than a month ago    Pain Frequency Intermittent    Aggravating Factors  going down stairs, decline walking, stiffness noted, getting out of chairs s UE assist    Pain Relieving Factors avoiding pain movement.    Effect of Pain on Daily Activities Limited in transfers, stairs              [facility] PT Assessment - 06/16/20 0001      Assessment   Medical Diagnosis Bilateral knee pain, chrondromalacia patella    Referring Provider (PT) Dr. Ninfa Linden    Onset Date/Surgical Date 05/03/20    Hand Dominance Right      Restrictions   Weight Bearing Restrictions No  Balance Screen   Has the patient fallen in the past 6 months No      [place] residence    Additional Comments spare bedroom on second level. entry stairs 1-2 into house      Prior Function   Level of Independence Independent    Leisure aerobic exercise (racquest ball, pickleball, skiing)      Cognition   Overall Cognitive Status Within Functional Limits for tasks assessed      Observation/Other Assessments   Focus on Therapeutic Outcomes (FOTO)  intake 67 %, outcome expected 72%      Sensation   Light Touch Appears Intact      Functional Tests   Functional tests Single leg stance;Sit to Stand      Single Leg Stance   Comments fair control for 15 seconds bilateral      Sit to Stand   Comments unable from 18 inch chair s UE assist      ROM / Strength   AROM / PROM / Strength  Strength;PROM;AROM      AROM   Overall AROM Comments Lt flexion end range mild pain    AROM Assessment Site Knee    Right/Left Knee Left;Right    Right Knee Extension 0    Right Knee Flexion 140    Left Knee Extension 0    Left Knee Flexion 140      PROM   PROM Assessment Site Knee    Right/Left Knee Left;Right      Strength   Strength Assessment Site Hip;Knee;Ankle    Right/Left Hip Left;Right    Right Hip Flexion 5/5    Right Hip ABduction 4/5    Left Hip Flexion 5/5    Left Hip ABduction 4/5    Right/Left Knee Right;Left    Right Knee Flexion 5/5    Right Knee Extension 4+/5    Left Knee Flexion 5/5    Left Knee Extension 4+/5    Right/Left Ankle Right;Left    Right Ankle Dorsiflexion 5/5    Left Ankle Dorsiflexion 5/5      Palpation   Palpation comment Crepitus noted in patellar movement bilateral, very mild lateral tracking observed bilateral      Special Tests   Other special tests eccentric step down produced pain bilateral, poor contorl                      Objective measurements completed on examination: See above findings.       [facility] PT Treatment/Exercise - 06/16/20 0001      Exercises   Exercises Other Exercises;Knee/Hip    Other Exercises  HEP instruction/performance c cues for intervention, trial set of each exercise:  supine slr/heel slide x 10 bilateral, supine bridge x 10, seated SLR 10x bilateral, sit to stand c eccentric lowering focus x 10 20 inch table                  PT Education - 06/16/20 0840    Education Details HEP, POC    Person(s) Educated Patient    Methods Explanation;Demonstration;Verbal cues;Handout    Comprehension Returned demonstration;Verbalized understanding            PT Short Term Goals - 06/16/20 0840      PT SHORT TERM GOAL #1   Title Patient will demonstrate independent use of home exercise program to maintain progress from in clinic treatments.    Time 3  Period Weeks    Status  New    Target Date 07/07/20             PT Long Term Goals - 06/16/20 0840      PT LONG TERM GOAL #1   Title Patient will demonstrate/report pain at worst less than or equal to 2/10 to facilitate minimal limitation in daily activity secondary to pain symptoms.    Time 8    Period Weeks    Status New    Target Date 08/11/20      PT LONG TERM GOAL #2   Title Patient will demonstrate independent use of home exercise program to facilitate ability to maintain/progress functional gains from skilled physical therapy services.    Time 8    Period Weeks    Status New    Target Date 08/11/20      PT LONG TERM GOAL #3   Title Pt. will demonstrate FOTO outcome > or = 72% to indicated reduced disability due to condition.    Time 8    Period Weeks    Status New    Target Date 08/11/20      PT LONG TERM GOAL #4   Title Pt. will demonstrate bilateral LE MMT 5/5 throughout to facilitate usual transfers, standing, stairs at PLOF s limitation.    Time 8    Period Weeks    Status New    Target Date 08/11/20      PT LONG TERM GOAL #5   Title Pt. will demonstrate bilateral SLS > 30 seconds to facilitate stability in ambulation on even and uneven surfaces.    Time 8    Period Weeks    Status New    Target Date 08/11/20                  Plan - 06/16/20 0841    Clinical Impression Statement Patient is a 75 y.o. female who comes to clinic with complaints of bilateral knee pain with mobility, strength and movement coordination deficits that impair their ability to perform usual daily and recreational functional activities without increase difficulty/symptoms at this time.  Patient to benefit from skilled PT services to address impairments and limitations to improve to previous level of function without restriction secondary to condition.    Personal Factors and Comorbidities Other   OA hips/knees, Rt THA 10 monts, Lt 17 months (anterior approaches)   Examination-Activity Limitations  Squat;Stairs;Stand;Transfers;Locomotion Level;Bend    Examination-Participation Restrictions Community Activity;Other   house hold activity   Stability/Clinical Decision Making Stable/Uncomplicated    Clinical Decision Making Low    Rehab Potential Good    PT Frequency 2x / week    PT Duration 8 weeks    PT Treatment/Interventions ADLs/Self Care Home Management;Ultrasound;Electrical Stimulation;Cryotherapy;Iontophoresis 4mg /ml Dexamethasone;Moist Heat;Balance training;Therapeutic exercise;Therapeutic activities;Functional mobility training;Stair training;Gait training;DME Instruction;Neuromuscular re-education;Patient/family education;Manual techniques;Taping;Dry needling;Passive range of motion;Spinal Manipulations;Joint Manipulations    PT Next Visit Plan Eccentric strengthening bilateral quad, balance compliant surfaces    PT Home Exercise Plan [email or web address]    Consulted and Agree with Plan of Care Patient           Patient will benefit from skilled therapeutic intervention in order to improve the following deficits and impairments:  Decreased endurance,Pain,Decreased strength,Decreased activity tolerance,Decreased balance,Decreased mobility,Difficulty walking,Improper body mechanics,Impaired perceived functional ability,Impaired flexibility,Decreased coordination,Decreased range of motion  Visit Diagnosis: Chronic pain of left knee  Chronic pain of right knee  Muscle weakness (generalized)  Difficulty in walking, not elsewhere classified  Problem List Patient Active Problem List   Diagnosis Date Noted  . 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 Primary osteoarthritis of left hip 12/07/2018  . Primary osteoarthritis of right hip 12/07/2018  . Pain of right hip joint 05/21/2017  . Trochanteric bursitis, right hip 04/23/2017  . Pain in right hip 04/23/2017  . Hearing loss of right ear due to cerumen  impaction 10/25/2016  . Chronic rhinitis 07/04/2015  . Cough 07/04/2015  . Endometrial polyp 01/11/2011    Scot Jun, PT, DPT, OCS, ATC 06/16/20  9:25 AM    [facility] Physical Therapy [address] [place], [place], [address] Phone: [phone]   Fax:  [phone]  Name: Anaijah Augsburger MRN: [id number] Date of Birth: 05-03-1946

## 2020-06-26 ENCOUNTER — Ambulatory Visit: Payer: Medicare Other | Admitting: Orthopaedic Surgery

## 2020-06-28 ENCOUNTER — Ambulatory Visit (INDEPENDENT_AMBULATORY_CARE_PROVIDER_SITE_OTHER): Payer: Medicare Other | Admitting: Orthopaedic Surgery

## 2020-06-28 ENCOUNTER — Ambulatory Visit: Payer: Self-pay

## 2020-06-28 ENCOUNTER — Ambulatory Visit (INDEPENDENT_AMBULATORY_CARE_PROVIDER_SITE_OTHER): Payer: Medicare Other

## 2020-06-28 ENCOUNTER — Encounter: Payer: Self-pay | Admitting: Orthopaedic Surgery

## 2020-06-28 DIAGNOSIS — M2242 Chondromalacia patellae, left knee: Secondary | ICD-10-CM | POA: Diagnosis not present

## 2020-06-28 DIAGNOSIS — M2241 Chondromalacia patellae, right knee: Secondary | ICD-10-CM

## 2020-06-28 DIAGNOSIS — M25562 Pain in left knee: Secondary | ICD-10-CM | POA: Diagnosis not present

## 2020-06-28 DIAGNOSIS — M25561 Pain in right knee: Secondary | ICD-10-CM | POA: Diagnosis not present

## 2020-06-28 DIAGNOSIS — G8929 Other chronic pain: Secondary | ICD-10-CM

## 2020-06-28 NOTE — Progress Notes (Signed)
Office Visit Note   Patient: Beverly [name]           Date of Birth: [birth year]           MRN: [id number] Visit Date: 06/28/2020              Requested by: Antony Contras, MD [place],  [place] [address] PCP: Antony Contras, MD   Assessment & Plan: Visit Diagnoses:  1. Chondromalacia patellae, left knee   2. Chondromalacia patellae, right knee   3. Chronic pain of left knee   4. Chronic pain of right knee     Plan: She will continue physical therapy for strengthening her quad muscles and strengthening the VMO of both knees with hopefully improving some tracking of the patella.  1 other intervention to consider at a later date would be hyaluronic acid for the knees to treat the pain from osteoarthritis of the patellofemoral joints.  Right now would not recommend an arthroscopic intervention such as her lateral release unless it got to where it was bothering her quite a bit.  Worst case scenario would be patellofemoral arthroplasties.  Follow-Up Instructions: Return in about 3 months (around 09/26/2020).   Orders:  Orders Placed This Encounter  Procedures  . XR KNEE 3 VIEW LEFT  . XR KNEE 3 VIEW RIGHT   No orders of the defined types were placed in this encounter.     Procedures: No procedures performed   Clinical Data: No additional findings.   Subjective: Chief Complaint  Patient presents with  . Right Knee - Follow-up  . Left Knee - Follow-up  The patient comes in today for continued follow-up as it relates to her knees.  She is a very active and then 75 year old female.  She does have chondromalacia of the patella both knees.  We have not x-rayed her for some time.  She is actively in physical therapy now but has not had many sessions.  We have wanted them to work on strengthening the VMO on both knees.  She is also working on just getting up from a sitting position better.  She has had no other active change in her medical status.  She has not  had any type of gel injections in her knees.  HPI  Review of Systems She currently denies any headache, chest pain, shortness of breath, fever, chills, nausea, vomiting  Objective: Vital Signs: There were no vitals taken for this visit.  Physical Exam She is alert and orient x3 and in no acute distress Ortho Exam Examination of both knees show that they are ligamentously stable.  Both knees have no pain at the medial lateral compartments.  Both knees have patellas of slightly track laterally with some patellofemoral crepitation. Specialty Comments:  No specialty comments available.  Imaging: XR KNEE 3 VIEW LEFT  Result Date: 06/28/2020 3 views of the left knee show well-maintained medial and lateral joint spaces.  There is moderate patellofemoral arthritic changes and slight lateral tracking of the patella.  XR KNEE 3 VIEW RIGHT  Result Date: 06/28/2020 3 views of the right knee show patellofemoral chondromalacia with slight lateral tracking of the patella.  The medial lateral compartments are well-maintained.    PMFS History: Patient Active Problem List   Diagnosis Date Noted  . 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 Primary osteoarthritis of left hip 12/07/2018  . Primary osteoarthritis of right  hip 12/07/2018  . Pain of right hip joint 05/21/2017  . Trochanteric bursitis, right hip 04/23/2017  . Pain in right hip 04/23/2017  . Hearing loss of right ear due to cerumen impaction 10/25/2016  . Chronic rhinitis 07/04/2015  . Cough 07/04/2015  . Endometrial polyp 01/11/2011   Past Medical History:  Diagnosis Date  . Atypical mole 11/03/2013   R prox post lat thigh   . Basal cell carcinoma    mid back  . Basal cell carcinoma 04/01/2019   L chest parasternal   . Basal cell carcinoma 09/11/2017   L mid back at braline 3.0 cm lat to spine  . Esophageal dysmotility   . GERD (gastroesophageal  reflux disease)    occassional  . OA (osteoarthritis)    hips and knees  . Pneumonia    history of   . PONV (postoperative nausea and vomiting)     Family History  Problem Relation Age of Onset  . Allergic rhinitis Neg Hx   . Angioedema Neg Hx   . Asthma Neg Hx   . Atopy Neg Hx   . Eczema Neg Hx   . Immunodeficiency Neg Hx   . Urticaria Neg Hx     Past Surgical History:  Procedure Laterality Date  . COLONOSCOPY    . FRACTURE SURGERY  2009   left ankle  . LAPAROSCOPY  1992   infection after tubal  . SHOULDER OPEN ROTATOR CUFF REPAIR Right 2009  . TOTAL HIP ARTHROPLASTY Left 01/29/2019   Procedure: LEFT TOTAL HIP ARTHROPLASTY ANTERIOR APPROACH;  Surgeon: Mcarthur Rossetti, MD;  Location: [facility];  Service: Orthopedics;  Laterality: Left;  . TOTAL HIP ARTHROPLASTY Right 09/24/2019   Procedure: RIGHT TOTAL HIP ARTHROPLASTY ANTERIOR APPROACH;  Surgeon: Mcarthur Rossetti, MD;  Location: [facility];  Service: Orthopedics;  Laterality: Right;  . TUBAL LIGATION  1992   Social History   Occupational History  . Not on file  Tobacco Use  . Smoking status: Former Smoker    Quit date: 12/27/1979    Years since quitting: 40.5  . Smokeless tobacco: Never Used  Vaping Use  . Vaping Use: Never used  Substance and Sexual Activity  . Alcohol use: Yes    Alcohol/week: 3.0 standard drinks    Types: 3 Glasses of wine per week    Comment: weekly  . Drug use: No  . Sexual activity: Never    Birth control/protection: Post-menopausal

## 2020-06-29 ENCOUNTER — Ambulatory Visit (INDEPENDENT_AMBULATORY_CARE_PROVIDER_SITE_OTHER): Payer: Medicare Other | Admitting: Physical Therapy

## 2020-06-29 ENCOUNTER — Other Ambulatory Visit: Payer: Self-pay

## 2020-06-29 ENCOUNTER — Encounter: Payer: Self-pay | Admitting: Physical Therapy

## 2020-06-29 DIAGNOSIS — M6281 Muscle weakness (generalized): Secondary | ICD-10-CM | POA: Diagnosis not present

## 2020-06-29 DIAGNOSIS — G8929 Other chronic pain: Secondary | ICD-10-CM | POA: Diagnosis not present

## 2020-06-29 DIAGNOSIS — M25561 Pain in right knee: Secondary | ICD-10-CM | POA: Diagnosis not present

## 2020-06-29 DIAGNOSIS — M25562 Pain in left knee: Secondary | ICD-10-CM

## 2020-06-29 DIAGNOSIS — R262 Difficulty in walking, not elsewhere classified: Secondary | ICD-10-CM | POA: Diagnosis not present

## 2020-06-29 NOTE — Therapy (Signed)
[place], [place], [address] Phone: [phone]   Fax:  [phone]  Physical Therapy Treatment  Patient Details  Name: Beverly [name] MRN: [id number] Date of Birth: 25-May-1946 Referring Provider (PT): Dr. Ninfa Linden   Encounter Date: 06/29/2020   PT End of Session - 06/29/20 0847    Visit Number 2    Number of Visits 12    Date for PT Re-Evaluation 08/11/20    Progress Note Due on Visit 10    PT Start Time 0800    PT Stop Time 0843    PT Time Calculation (min) 43 min    Activity Tolerance Patient tolerated treatment well    Behavior During Therapy [facility] for tasks assessed/performed           Past Medical History:  Diagnosis Date  . Atypical mole 11/03/2013   R prox post lat thigh   . Basal cell carcinoma    mid back  . Basal cell carcinoma 04/01/2019   L chest parasternal   . Basal cell carcinoma 09/11/2017   L mid back at braline 3.0 cm lat to spine  . Esophageal dysmotility   . GERD (gastroesophageal reflux disease)    occassional  . OA (osteoarthritis)    hips and knees  . Pneumonia    history of   . PONV (postoperative nausea and vomiting)     Past Surgical History:  Procedure Laterality Date  . COLONOSCOPY    . FRACTURE SURGERY  2009   left ankle  . LAPAROSCOPY  1992   infection after tubal  . SHOULDER OPEN ROTATOR CUFF REPAIR Right 2009  . TOTAL HIP ARTHROPLASTY Left 01/29/2019   Procedure: LEFT TOTAL HIP ARTHROPLASTY ANTERIOR APPROACH;  Surgeon: Mcarthur Rossetti, MD;  Location: [facility];  Service: Orthopedics;  Laterality: Left;  . TOTAL HIP ARTHROPLASTY Right 09/24/2019   Procedure: RIGHT TOTAL HIP ARTHROPLASTY ANTERIOR APPROACH;  Surgeon: Mcarthur Rossetti, MD;  Location: [facility];  Service: Orthopedics;  Laterality: Right;  . TUBAL LIGATION  1992    There were no vitals filed for this visit.   Subjective Assessment - 06/29/20 0803    Subjective knees are "about the same." going  down hills/steps is challenging    Pertinent History History of bilateral THA replacement, history of polo as 1 year.    Patient Stated Goals Reduce pain    Currently in Pain? Yes    Pain Score 2    up to 9/10   Pain Location Knee    Pain Orientation Left;Right    Pain Descriptors / Indicators Sharp;Tightness    Pain Type Chronic pain    Pain Onset More than a month ago    Pain Frequency Intermittent    Aggravating Factors  going down stairs/hills    Pain Relieving Factors avoiding movements                             [facility] Adult PT Treatment/Exercise - 06/29/20 0805      Knee/Hip Exercises: Stretches   Quad Stretch Right;Left;3 reps;30 seconds    Quad Stretch Limitations prone with strap      Knee/Hip Exercises: Aerobic   Nustep L4 x 6 min      Knee/Hip Exercises: Machines for Strengthening   Cybex Knee Extension 5# 3x10; slow eccentrics    Cybex Leg Press SL bil 50# 3x10  PT Short Term Goals - 06/16/20 0840      PT SHORT TERM GOAL #1   Title Patient will demonstrate independent use of home exercise program to maintain progress from in clinic treatments.    Time 3    Period Weeks    Status New    Target Date 07/07/20             PT Long Term Goals - 06/16/20 0840      PT LONG TERM GOAL #1   Title Patient will demonstrate/report pain at worst less than or equal to 2/10 to facilitate minimal limitation in daily activity secondary to pain symptoms.    Time 8    Period Weeks    Status New    Target Date 08/11/20      PT LONG TERM GOAL #2   Title Patient will demonstrate independent use of home exercise program to facilitate ability to maintain/progress functional gains from skilled physical therapy services.    Time 8    Period Weeks    Status New    Target Date 08/11/20      PT LONG TERM GOAL #3   Title Pt. will demonstrate FOTO outcome > or = 72% to indicated reduced disability due to condition.    Time 8     Period Weeks    Status New    Target Date 08/11/20      PT LONG TERM GOAL #4   Title Pt. will demonstrate bilateral LE MMT 5/5 throughout to facilitate usual transfers, standing, stairs at PLOF s limitation.    Time 8    Period Weeks    Status New    Target Date 08/11/20      PT LONG TERM GOAL #5   Title Pt. will demonstrate bilateral SLS > 30 seconds to facilitate stability in ambulation on even and uneven surfaces.    Time 8    Period Weeks    Status New    Target Date 08/11/20                 Plan - 06/29/20 0847    Clinical Impression Statement Pt tolerated session well today with focus on strengthening and eccentric quad control.  Will continue to benefit from PT to maximize function.  No goals met as only 2nd visit.    Personal Factors and Comorbidities Other   OA hips/knees, Rt THA 10 monts, Lt 17 months (anterior approaches)   Examination-Activity Limitations Squat;Stairs;Stand;Transfers;Locomotion Level;Bend    Examination-Participation Restrictions Community Activity;Other   house hold activity   Stability/Clinical Decision Making Stable/Uncomplicated    Rehab Potential Good    PT Frequency 2x / week    PT Duration 8 weeks    PT Treatment/Interventions ADLs/Self Care Home Management;Ultrasound;Electrical Stimulation;Cryotherapy;Iontophoresis 57m/ml Dexamethasone;Moist Heat;Balance training;Therapeutic exercise;Therapeutic activities;Functional mobility training;Stair training;Gait training;DME Instruction;Neuromuscular re-education;Patient/family education;Manual techniques;Taping;Dry needling;Passive range of motion;Spinal Manipulations;Joint Manipulations    PT Next Visit Plan Eccentric strengthening bilateral quad, balance compliant surfaces, review HEP for STG assessment    PT Home Exercise Plan [email or web address]    Consulted and Agree with Plan of Care Patient           Patient will benefit from skilled therapeutic intervention in order to improve the following  deficits and impairments:  Decreased endurance,Pain,Decreased strength,Decreased activity tolerance,Decreased balance,Decreased mobility,Difficulty walking,Improper body mechanics,Impaired perceived functional ability,Impaired flexibility,Decreased coordination,Decreased range of motion  Visit Diagnosis: Chronic pain of left knee  Chronic pain of right knee  Muscle weakness (generalized)  Difficulty  in walking, not elsewhere classified     Problem List Patient Active Problem List   Diagnosis Date Noted  . 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 Primary osteoarthritis of left hip 12/07/2018  . Primary osteoarthritis of right hip 12/07/2018  . Pain of right hip joint 05/21/2017  . Trochanteric bursitis, right hip 04/23/2017  . Pain in right hip 04/23/2017  . Hearing loss of right ear due to cerumen impaction 10/25/2016  . Chronic rhinitis 07/04/2015  . Cough 07/04/2015  . Endometrial polyp 01/11/2011      Laureen Abrahams, PT, DPT 06/29/20 8:49 AM    [facility] Physical Therapy [address] [place], [place], [address] Phone: [phone]   Fax:  [phone]  Name: Yanet Balliet MRN: [id number] Date of Birth: 03-02-1946

## 2020-07-03 ENCOUNTER — Other Ambulatory Visit: Payer: Self-pay

## 2020-07-03 ENCOUNTER — Encounter: Payer: Self-pay | Admitting: Rehabilitative and Restorative Service Providers"

## 2020-07-03 ENCOUNTER — Ambulatory Visit (INDEPENDENT_AMBULATORY_CARE_PROVIDER_SITE_OTHER): Payer: Medicare Other | Admitting: Rehabilitative and Restorative Service Providers"

## 2020-07-03 DIAGNOSIS — R262 Difficulty in walking, not elsewhere classified: Secondary | ICD-10-CM | POA: Diagnosis not present

## 2020-07-03 DIAGNOSIS — M6281 Muscle weakness (generalized): Secondary | ICD-10-CM | POA: Diagnosis not present

## 2020-07-03 DIAGNOSIS — M25562 Pain in left knee: Secondary | ICD-10-CM

## 2020-07-03 DIAGNOSIS — M25561 Pain in right knee: Secondary | ICD-10-CM | POA: Diagnosis not present

## 2020-07-03 DIAGNOSIS — G8929 Other chronic pain: Secondary | ICD-10-CM

## 2020-07-03 NOTE — Therapy (Signed)
[place], [place], [address] Phone: [phone]   Fax:  [phone]  Physical Therapy Treatment  Patient Details  Name: Beverly [name] MRN: [id number] Date of Birth: 1946-01-01 Referring Provider (PT): Dr. Ninfa Linden   Encounter Date: 07/03/2020   PT End of Session - 07/03/20 1020    Visit Number 3    Number of Visits 12    Date for PT Re-Evaluation 08/11/20    Progress Note Due on Visit 10    PT Start Time 1015    PT Stop Time 1055    PT Time Calculation (min) 40 min    Activity Tolerance Patient tolerated treatment well    Behavior During Therapy [facility] for tasks assessed/performed           Past Medical History:  Diagnosis Date  . Atypical mole 11/03/2013   R prox post lat thigh   . Basal cell carcinoma    mid back  . Basal cell carcinoma 04/01/2019   L chest parasternal   . Basal cell carcinoma 09/11/2017   L mid back at braline 3.0 cm lat to spine  . Esophageal dysmotility   . GERD (gastroesophageal reflux disease)    occassional  . OA (osteoarthritis)    hips and knees  . Pneumonia    history of   . PONV (postoperative nausea and vomiting)     Past Surgical History:  Procedure Laterality Date  . COLONOSCOPY    . FRACTURE SURGERY  2009   left ankle  . LAPAROSCOPY  1992   infection after tubal  . SHOULDER OPEN ROTATOR CUFF REPAIR Right 2009  . TOTAL HIP ARTHROPLASTY Left 01/29/2019   Procedure: LEFT TOTAL HIP ARTHROPLASTY ANTERIOR APPROACH;  Surgeon: Mcarthur Rossetti, MD;  Location: [facility];  Service: Orthopedics;  Laterality: Left;  . TOTAL HIP ARTHROPLASTY Right 09/24/2019   Procedure: RIGHT TOTAL HIP ARTHROPLASTY ANTERIOR APPROACH;  Surgeon: Mcarthur Rossetti, MD;  Location: [facility];  Service: Orthopedics;  Laterality: Right;  . TUBAL LIGATION  1992    There were no vitals filed for this visit.   Subjective Assessment - 07/03/20 1018    Subjective Pt. indicated feeling similar in  knees but does feel like her strength is getting better with some of the exercises.  Pt. stated getting in and out of chair still can be difficult.    Pertinent History History of bilateral THA replacement, history of polo as 1 year.    Patient Stated Goals Reduce pain    Currently in Pain? Other (Comment)   stiffness in knees   Pain Orientation Left;Right    Pain Descriptors / Indicators Tightness;Other (Comment)   stiffness, uncomfortable   Pain Onset More than a month ago                             [facility] Adult PT Treatment/Exercise - 07/03/20 0001      Knee/Hip Exercises: Aerobic   Recumbent Bike Lvl 3 10 mins      Knee/Hip Exercises: Machines for Strengthening   Cybex Knee Extension eccentric SL lowering 5 lbs x 10 bilateral    Cybex Leg Press SL50 lbs 3 x 15, performed bilateral      Knee/Hip Exercises: Standing   Lateral Step Up Hand Hold: 0;Step Height: 4";2 sets;10 reps;Both   eccentric step down control     Knee/Hip Exercises: Seated   Other Seated Knee/Hip Exercises seated SLR 3  x 10 bilateral                    PT Short Term Goals - 07/03/20 1019      PT SHORT TERM GOAL #1   Title Patient will demonstrate independent use of home exercise program to maintain progress from in clinic treatments.    Time 3    Period Weeks    Status On-going    Target Date 07/07/20             PT Long Term Goals - 06/16/20 0840      PT LONG TERM GOAL #1   Title Patient will demonstrate/report pain at worst less than or equal to 2/10 to facilitate minimal limitation in daily activity secondary to pain symptoms.    Time 8    Period Weeks    Status New    Target Date 08/11/20      PT LONG TERM GOAL #2   Title Patient will demonstrate independent use of home exercise program to facilitate ability to maintain/progress functional gains from skilled physical therapy services.    Time 8    Period Weeks    Status New    Target Date 08/11/20      PT  LONG TERM GOAL #3   Title Pt. will demonstrate FOTO outcome > or = 72% to indicated reduced disability due to condition.    Time 8    Period Weeks    Status New    Target Date 08/11/20      PT LONG TERM GOAL #4   Title Pt. will demonstrate bilateral LE MMT 5/5 throughout to facilitate usual transfers, standing, stairs at PLOF s limitation.    Time 8    Period Weeks    Status New    Target Date 08/11/20      PT LONG TERM GOAL #5   Title Pt. will demonstrate bilateral SLS > 30 seconds to facilitate stability in ambulation on even and uneven surfaces.    Time 8    Period Weeks    Status New    Target Date 08/11/20                 Plan - 07/03/20 1031    Clinical Impression Statement Pt. to benefit from long term LE strengthening, specifically in quad gains that can help reduced strain and improve mechanics in knee and LE to improve functional activity.    Personal Factors and Comorbidities Other   OA hips/knees, Rt THA 10 monts, Lt 17 months (anterior approaches)   Examination-Activity Limitations Squat;Stairs;Stand;Transfers;Locomotion Level;Bend    Examination-Participation Restrictions Community Activity;Other   house hold activity   Stability/Clinical Decision Making Stable/Uncomplicated    Rehab Potential Good    PT Frequency 2x / week    PT Duration 8 weeks    PT Treatment/Interventions ADLs/Self Care Home Management;Ultrasound;Electrical Stimulation;Cryotherapy;Iontophoresis 4mg /ml Dexamethasone;Moist Heat;Balance training;Therapeutic exercise;Therapeutic activities;Functional mobility training;Stair training;Gait training;DME Instruction;Neuromuscular re-education;Patient/family education;Manual techniques;Taping;Dry needling;Passive range of motion;Spinal Manipulations;Joint Manipulations    PT Next Visit Plan Eccentric strengthening bilateral quad, balance compliant surfaces,    PT Home Exercise Plan [id number]    Consulted and Agree with Plan of Care Patient            Patient will benefit from skilled therapeutic intervention in order to improve the following deficits and impairments:  Decreased endurance,Pain,Decreased strength,Decreased activity tolerance,Decreased balance,Decreased mobility,Difficulty walking,Improper body mechanics,Impaired perceived functional ability,Impaired flexibility,Decreased coordination,Decreased range of motion  Visit Diagnosis: Chronic pain of  left knee  Chronic pain of right knee  Muscle weakness (generalized)  Difficulty in walking, not elsewhere classified     Problem List Patient Active Problem List   Diagnosis Date Noted  . 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 Primary osteoarthritis of left hip 12/07/2018  . Primary osteoarthritis of right hip 12/07/2018  . Pain of right hip joint 05/21/2017  . Trochanteric bursitis, right hip 04/23/2017  . Pain in right hip 04/23/2017  . Hearing loss of right ear due to cerumen impaction 10/25/2016  . Chronic rhinitis 07/04/2015  . Cough 07/04/2015  . Endometrial polyp 01/11/2011   Scot Jun, PT, DPT, OCS, ATC 07/03/20  10:55 AM    [facility] Physical Therapy [address] [place], [place], [address] Phone: [phone]   Fax:  [phone]  Name: Sanyah Molnar MRN: [id number] Date of Birth: 1946/05/11

## 2020-07-05 ENCOUNTER — Encounter: Payer: Medicare Other | Admitting: Rehabilitative and Restorative Service Providers"

## 2020-07-10 ENCOUNTER — Encounter: Payer: Medicare Other | Admitting: Rehabilitative and Restorative Service Providers"

## 2020-07-12 ENCOUNTER — Encounter: Payer: Self-pay | Admitting: Rehabilitative and Restorative Service Providers"

## 2020-07-12 ENCOUNTER — Ambulatory Visit (INDEPENDENT_AMBULATORY_CARE_PROVIDER_SITE_OTHER): Payer: Medicare Other | Admitting: Rehabilitative and Restorative Service Providers"

## 2020-07-12 ENCOUNTER — Other Ambulatory Visit: Payer: Self-pay

## 2020-07-12 DIAGNOSIS — G8929 Other chronic pain: Secondary | ICD-10-CM

## 2020-07-12 DIAGNOSIS — M25561 Pain in right knee: Secondary | ICD-10-CM | POA: Diagnosis not present

## 2020-07-12 DIAGNOSIS — R262 Difficulty in walking, not elsewhere classified: Secondary | ICD-10-CM | POA: Diagnosis not present

## 2020-07-12 DIAGNOSIS — M25562 Pain in left knee: Secondary | ICD-10-CM

## 2020-07-12 DIAGNOSIS — M6281 Muscle weakness (generalized): Secondary | ICD-10-CM

## 2020-07-12 IMAGING — RF DG HIP (WITH PELVIS) OPERATIVE*R*
1 series · 3 of 3 positions shown · non-contrast
Comparison: 01/29/2019

CLINICAL DATA: Intra op right anterior hip replacement

EXAM:
OPERATIVE right HIP (WITH PELVIS IF PERFORMED) 2 VIEWS
TECHNIQUE: Fluoroscopic spot image(s) were submitted for interpretation
post-operatively.

[Series 1: unknown protocol · 0.20mm/px · 3 of 3 slices shown]
[im 1/3]
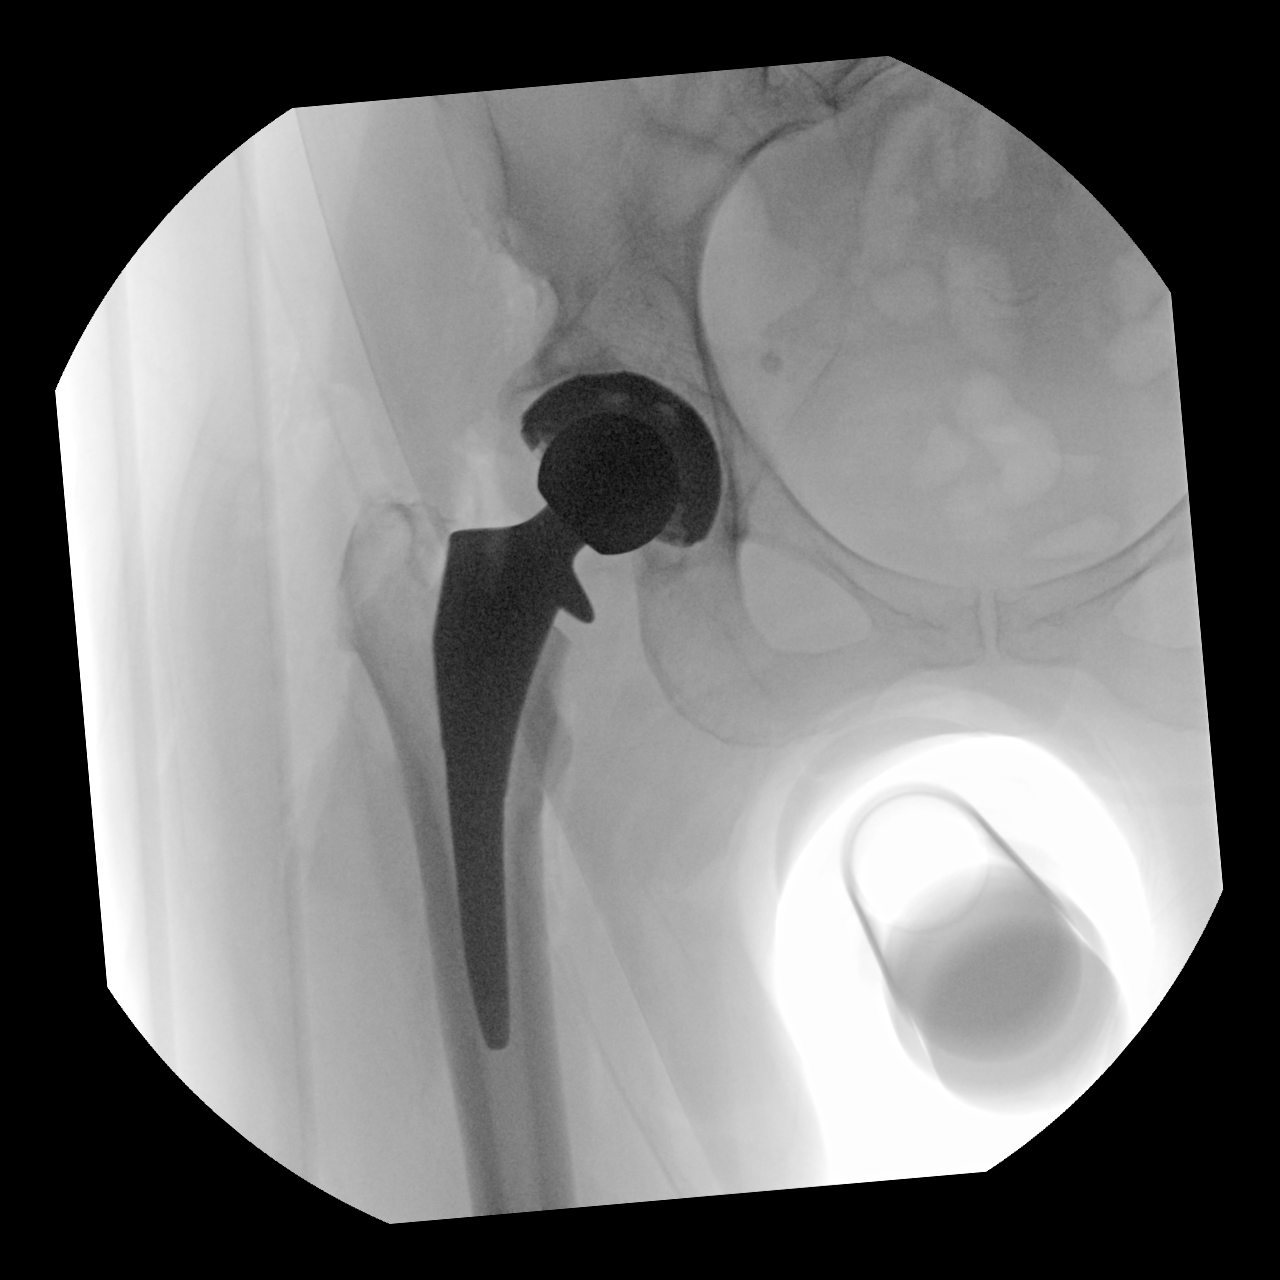
[im 2/3]
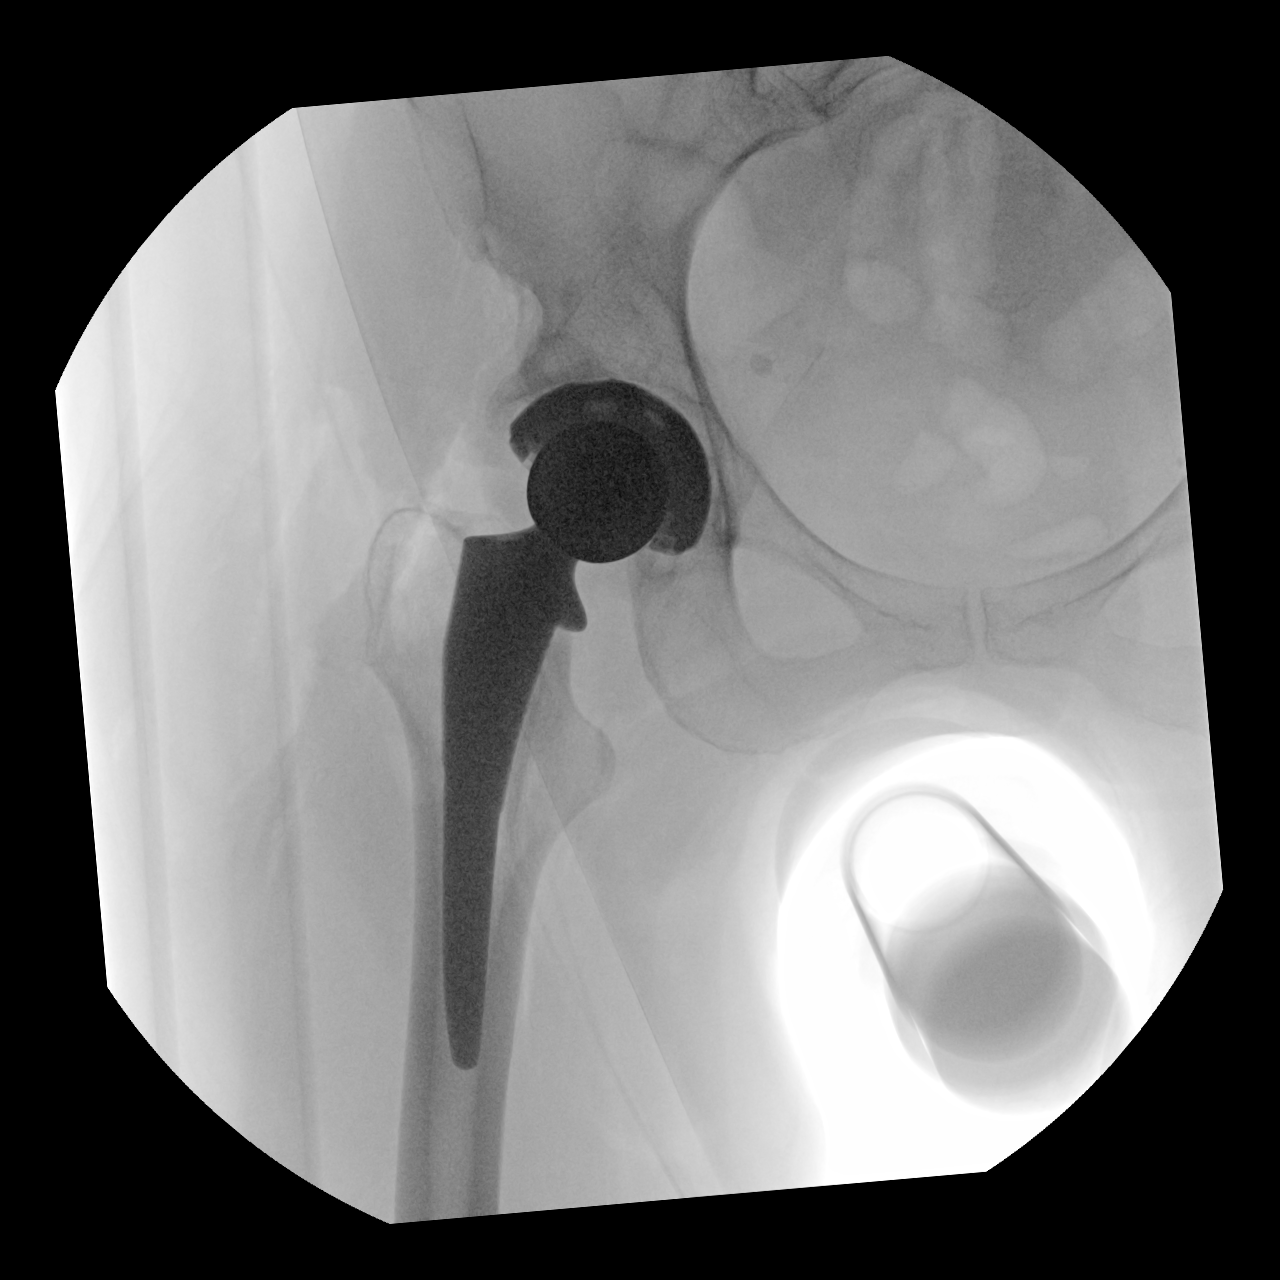
[im 3/3]
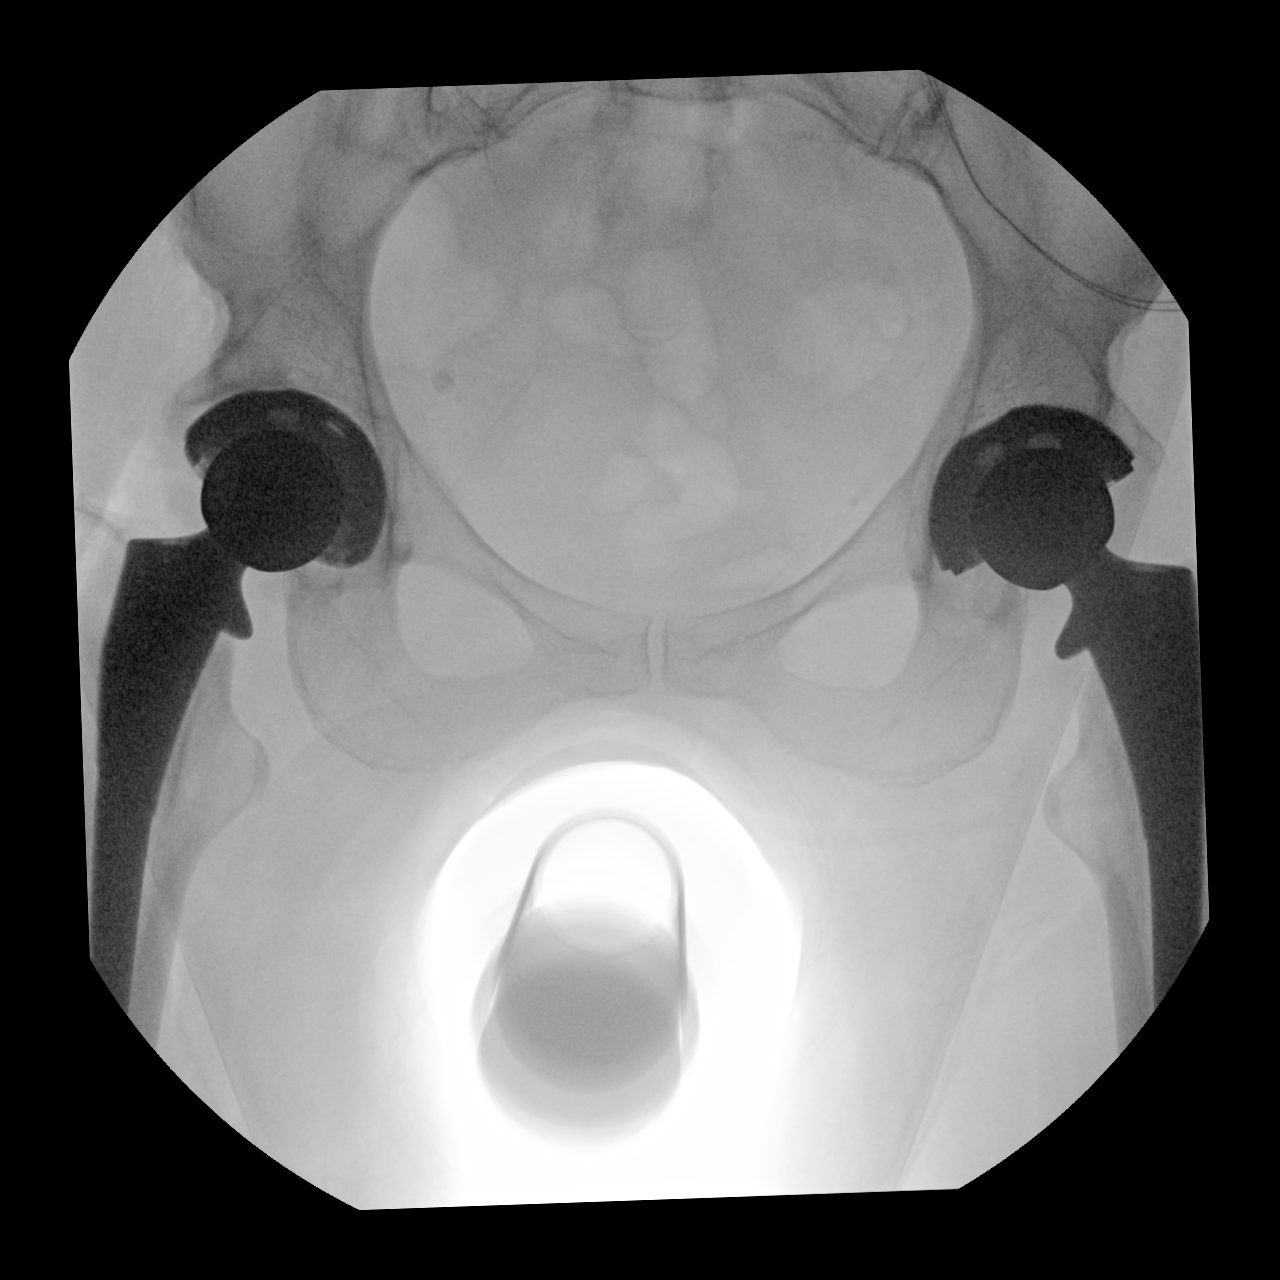

[3 of 3 positions shown; findings below may reference images not displayed]

FINDINGS: Three fluoroscopic images are obtained during the performance of the
procedure and are provided for interpretation only. Right total hip
arthroplasty has been placed, in the expected position with no signs
of complication. Stable left hip arthroplasty.

FLUOROSCOPY TIME:  13 seconds
IMPRESSION: 1. Intraoperative exam as above.

## 2020-07-12 IMAGING — DX DG PORTABLE PELVIS
1 series · 1 of 1 positions shown · non-contrast
Comparison: Earlier radiograph dated 09/24/2019.

CLINICAL DATA: 73-year-old female status post right hip
arthroplasty.

EXAM:
PORTABLE PELVIS 1-2 VIEWS

[pelvis ap]
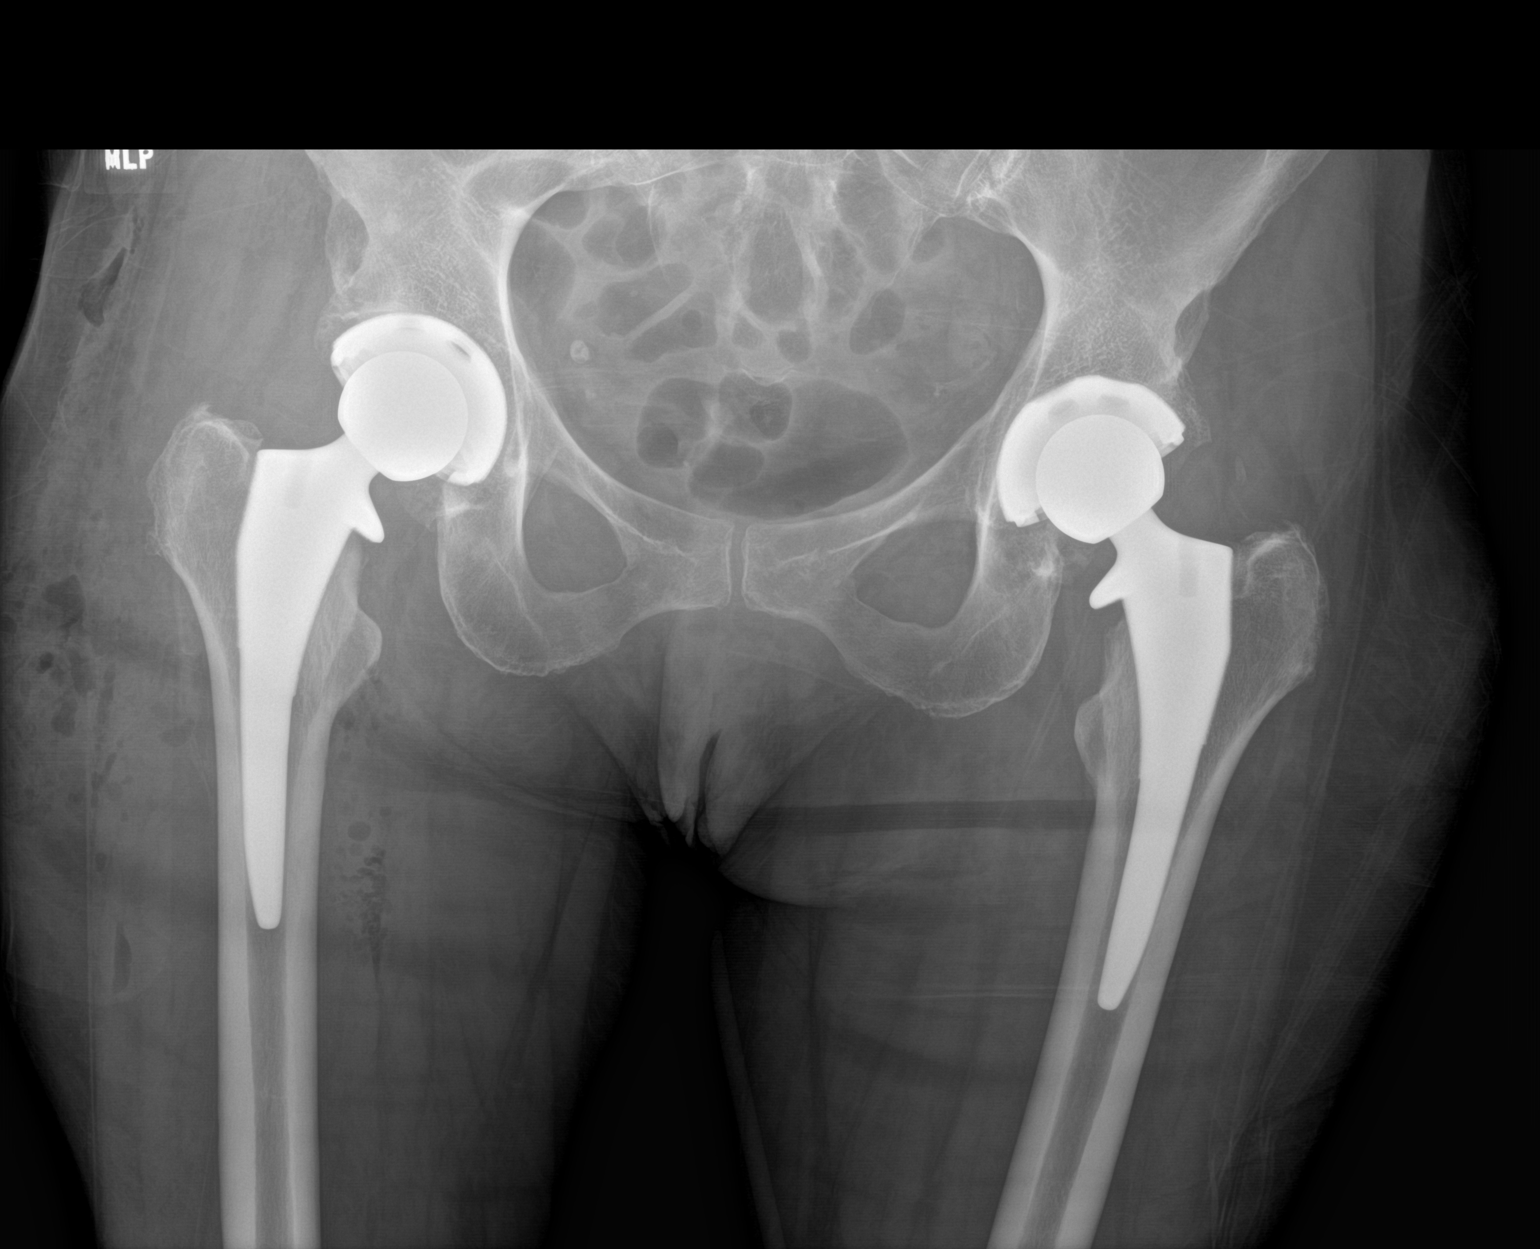

[1 of 1 positions shown; findings below may reference images not displayed]

FINDINGS: Bilateral total hip arthroplasties. The arthroplasty components
appear intact and in anatomic alignment. There is no acute fracture
or dislocation. The bones are osteopenic. Postsurgical changes and
air in the soft tissues of the right hip.
IMPRESSION: Bilateral total hip arthroplasties. No complication.

## 2020-07-12 NOTE — Therapy (Addendum)
[place], [place], [address] Phone: [phone]   Fax:  [phone]  Physical Therapy Treatment/Discharge  Patient Details  Name: Beverly [name] MRN: [id number] Date of Birth: 09-Feb-1946 Referring Provider (PT): Dr. Ninfa Linden   Encounter Date: 07/12/2020   PT End of Session - 07/12/20 1033    Visit Number 4    Number of Visits 12    Date for PT Re-Evaluation 08/11/20    Progress Note Due on Visit 10    PT Start Time 1015    PT Stop Time 1055    PT Time Calculation (min) 40 min    Activity Tolerance Patient tolerated treatment well    Behavior During Therapy [facility] for tasks assessed/performed           Past Medical History:  Diagnosis Date  . Atypical mole 11/03/2013   R prox post lat thigh   . Basal cell carcinoma    mid back  . Basal cell carcinoma 04/01/2019   L chest parasternal   . Basal cell carcinoma 09/11/2017   L mid back at braline 3.0 cm lat to spine  . Esophageal dysmotility   . GERD (gastroesophageal reflux disease)    occassional  . OA (osteoarthritis)    hips and knees  . Pneumonia    history of   . PONV (postoperative nausea and vomiting)     Past Surgical History:  Procedure Laterality Date  . COLONOSCOPY    . FRACTURE SURGERY  2009   left ankle  . LAPAROSCOPY  1992   infection after tubal  . SHOULDER OPEN ROTATOR CUFF REPAIR Right 2009  . TOTAL HIP ARTHROPLASTY Left 01/29/2019   Procedure: LEFT TOTAL HIP ARTHROPLASTY ANTERIOR APPROACH;  Surgeon: Mcarthur Rossetti, MD;  Location: [facility];  Service: Orthopedics;  Laterality: Left;  . TOTAL HIP ARTHROPLASTY Right 09/24/2019   Procedure: RIGHT TOTAL HIP ARTHROPLASTY ANTERIOR APPROACH;  Surgeon: Mcarthur Rossetti, MD;  Location: [facility];  Service: Orthopedics;  Laterality: Right;  . TUBAL LIGATION  1992    There were no vitals filed for this visit.   Subjective Assessment - 07/12/20 1034    Subjective GROC +0 at this time.  Pt.  indicated having some pain in knee where popping occurred for a week off and on but better now.   Pt. voiced continued desire to not go to [facility] due to mask mandate and indicated some desire to work at home with exercise plan until she could go back to the way.    Pertinent History History of bilateral THA replacement, history of polo as 1 year.    Patient Stated Goals Reduce pain    Currently in Pain? No/denies    Pain Score --   at worst 8/10 from sit to stands   Pain Location Knee    Pain Orientation Left;Right    Pain Descriptors / Indicators Sharp    Pain Onset More than a month ago    Pain Frequency Occasional    Aggravating Factors  down stairs/hills, low chair tranfers    Pain Relieving Factors avoiding the movements    Effect of Pain on Daily Activities Limited in transfers, stairs              [facility] PT Assessment - 07/12/20 0001      Assessment   Medical Diagnosis Bilateral knee pain, chrondromalacia patella    Referring Provider (PT) Dr. Ninfa Linden    Onset Date/Surgical Date 05/03/20    Hand  Dominance Right      Observation/Other Assessments   Focus on Therapeutic Outcomes (FOTO)  59                         [facility] Adult PT Treatment/Exercise - 07/12/20 0001      Self-Care   Self-Care Other Self-Care Comments    Other Self-Care Comments  Self care performed c cues for HEP performance, routine, importance education.  Cues given on postural adjustments and environmental setup (chairs, etc) to help reduced strain while still getting strengthening program.      Knee/Hip Exercises: Aerobic   Other Aerobic UBE LE only lvl 5 10 mins      Knee/Hip Exercises: Machines for Strengthening   Cybex Leg Press SL Rt 2 x 10 (limited due to anterior knee pain), Lt LE 2 x 10  37 lbs      Knee/Hip Exercises: Seated   Other Seated Knee/Hip Exercises seated SLR 3 x 10 bilateral    Sit to Sand without UE support   30x                 PT Education - 07/12/20  1037    Education Details HEP review, cues for importance for long term plan and gym membership usage    Person(s) Educated Patient    Methods Explanation;Demonstration    Comprehension Verbalized understanding;Returned demonstration            PT Short Term Goals - 07/03/20 1019      PT SHORT TERM GOAL #1   Title Patient will demonstrate independent use of home exercise program to maintain progress from in clinic treatments.    Time 3    Period Weeks    Status On-going    Target Date 07/07/20             PT Long Term Goals - 07/12/20 1058      PT LONG TERM GOAL #1   Title Patient will demonstrate/report pain at worst less than or equal to 2/10 to facilitate minimal limitation in daily activity secondary to pain symptoms.    Time 8    Period Weeks    Status On-going      PT LONG TERM GOAL #2   Title Patient will demonstrate independent use of home exercise program to facilitate ability to maintain/progress functional gains from skilled physical therapy services.    Time 8    Period Weeks    Status On-going      PT LONG TERM GOAL #3   Title Pt. will demonstrate FOTO outcome > or = 72% to indicated reduced disability due to condition.    Time 8    Period Weeks    Status Achieved      PT LONG TERM GOAL #4   Title Pt. will demonstrate bilateral LE MMT 5/5 throughout to facilitate usual transfers, standing, stairs at PLOF s limitation.    Time 8    Period Weeks    Status On-going      PT LONG TERM GOAL #5   Title Pt. will demonstrate bilateral SLS > 30 seconds to facilitate stability in ambulation on even and uneven surfaces.    Time 8    Period Weeks    Status On-going                 Plan - 07/12/20 1055    Clinical Impression Statement Pt. continued to present today c LE weakness, Rt  greater than Lt at this time.  Extended discussion and education performed today on importance of long term plan implementation for management of strength training and  symptom reduction.  Pt. to benefit from continued strengthening to lower jt reaction stress to improve functional mobility.  HIgh encouragement for gym based incorporation    Personal Factors and Comorbidities Other   OA hips/knees, Rt THA 10 monts, Lt 17 months (anterior approaches)   Examination-Activity Limitations Squat;Stairs;Stand;Transfers;Locomotion Level;Bend    Examination-Participation Restrictions Community Activity;Other   house hold activity   Stability/Clinical Decision Making Stable/Uncomplicated    Rehab Potential Good    PT Frequency 2x / week    PT Duration 8 weeks    PT Treatment/Interventions ADLs/Self Care Home Management;Ultrasound;Electrical Stimulation;Cryotherapy;Iontophoresis 28m/ml Dexamethasone;Moist Heat;Balance training;Therapeutic exercise;Therapeutic activities;Functional mobility training;Stair training;Gait training;DME Instruction;Neuromuscular re-education;Patient/family education;Manual techniques;Taping;Dry needling;Passive range of motion;Spinal Manipulations;Joint Manipulations    PT Next Visit Plan Eccentric strengthening bilateral quad, balance compliant surfaces, general LE strengthening program.    PT Home Exercise Plan [id number]    Consulted and Agree with Plan of Care Patient           Patient will benefit from skilled therapeutic intervention in order to improve the following deficits and impairments:  Decreased endurance,Pain,Decreased strength,Decreased activity tolerance,Decreased balance,Decreased mobility,Difficulty walking,Improper body mechanics,Impaired perceived functional ability,Impaired flexibility,Decreased coordination,Decreased range of motion  Visit Diagnosis: Chronic pain of left knee  Chronic pain of right knee  Muscle weakness (generalized)  Difficulty in walking, not elsewhere classified     Problem List Patient Active Problem List   Diagnosis Date Noted  . 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 Primary osteoarthritis of left hip 12/07/2018  . Primary osteoarthritis of right hip 12/07/2018  . Pain of right hip joint 05/21/2017  . Trochanteric bursitis, right hip 04/23/2017  . Pain in right hip 04/23/2017  . Hearing loss of right ear due to cerumen impaction 10/25/2016  . Chronic rhinitis 07/04/2015  . Cough 07/04/2015  . Endometrial polyp 01/11/2011    MScot Jun PT, DPT, OCS, ATC 07/12/20  11:00 AM   PHYSICAL THERAPY DISCHARGE SUMMARY  Visits from Start of Care: 4  Current functional level related to goals / functional outcomes: See note   Remaining deficits: See note   Education / Equipment: HEP Plan: Patient agrees to discharge.  Patient goals were partially met. Patient is being discharged due to not returning since the last visit.  ?????    MScot Jun PT, DPT, OCS, ATC 09/06/20  2:52 PM        [facility] [address] [address]Phone: [phone]  Fax:  [phone] Name: Beverly MangalMRN: [id number] of Birth: [birth year]

## 2020-07-17 ENCOUNTER — Encounter: Payer: Medicare Other | Admitting: Rehabilitative and Restorative Service Providers"

## 2020-07-19 ENCOUNTER — Encounter: Payer: Medicare Other | Admitting: Rehabilitative and Restorative Service Providers"

## 2020-07-24 ENCOUNTER — Encounter: Payer: Medicare Other | Admitting: Rehabilitative and Restorative Service Providers"

## 2020-07-26 ENCOUNTER — Encounter: Payer: Medicare Other | Admitting: Rehabilitative and Restorative Service Providers"

## 2020-07-31 ENCOUNTER — Encounter: Payer: Medicare Other | Admitting: Rehabilitative and Restorative Service Providers"

## 2020-07-31 ENCOUNTER — Ambulatory Visit: Payer: Medicare Other

## 2020-08-02 ENCOUNTER — Ambulatory Visit: Payer: Medicare Other

## 2020-08-02 ENCOUNTER — Encounter: Payer: Medicare Other | Admitting: Rehabilitative and Restorative Service Providers"

## 2020-08-07 ENCOUNTER — Encounter: Payer: Medicare Other | Admitting: Dermatology

## 2020-08-21 ENCOUNTER — Ambulatory Visit (INDEPENDENT_AMBULATORY_CARE_PROVIDER_SITE_OTHER): Payer: Medicare Other

## 2020-08-21 ENCOUNTER — Other Ambulatory Visit: Payer: Self-pay

## 2020-08-21 DIAGNOSIS — H01131 Eczematous dermatitis of right upper eyelid: Secondary | ICD-10-CM | POA: Diagnosis not present

## 2020-08-21 NOTE — Progress Notes (Signed)
Patient here today to apply True Test [phone].  Panels 1-3 applied to top of patients back. Patient understands do not scratch, soak or get wet.   Patient will return Wednesday for nurse visit to remove panels and review testing.

## 2020-08-23 ENCOUNTER — Other Ambulatory Visit: Payer: Self-pay

## 2020-08-23 ENCOUNTER — Ambulatory Visit (INDEPENDENT_AMBULATORY_CARE_PROVIDER_SITE_OTHER): Payer: Medicare Other

## 2020-08-23 DIAGNOSIS — H01131 Eczematous dermatitis of right upper eyelid: Secondary | ICD-10-CM

## 2020-08-23 NOTE — Progress Notes (Signed)
Pt here for 1st read of T.R.U.E patch testing. I showed pt in mirror how to have someone read test for her over the weekend. Advised her to have it checked once a day and to note any reactions she may or may not see over the next few days.   No reactions noted today when patches removed.

## 2020-08-30 ENCOUNTER — Ambulatory Visit (INDEPENDENT_AMBULATORY_CARE_PROVIDER_SITE_OTHER): Payer: Medicare Other | Admitting: Dermatology

## 2020-08-30 ENCOUNTER — Other Ambulatory Visit: Payer: Self-pay

## 2020-08-30 DIAGNOSIS — D229 Melanocytic nevi, unspecified: Secondary | ICD-10-CM | POA: Diagnosis not present

## 2020-08-30 DIAGNOSIS — D1800 Hemangioma unspecified site: Secondary | ICD-10-CM

## 2020-08-30 DIAGNOSIS — Z1283 Encounter for screening for malignant neoplasm of skin: Secondary | ICD-10-CM

## 2020-08-30 DIAGNOSIS — H01132 Eczematous dermatitis of right lower eyelid: Secondary | ICD-10-CM

## 2020-08-30 DIAGNOSIS — H01135 Eczematous dermatitis of left lower eyelid: Secondary | ICD-10-CM | POA: Diagnosis not present

## 2020-08-30 DIAGNOSIS — L905 Scar conditions and fibrosis of skin: Secondary | ICD-10-CM

## 2020-08-30 DIAGNOSIS — L578 Other skin changes due to chronic exposure to nonionizing radiation: Secondary | ICD-10-CM

## 2020-08-30 DIAGNOSIS — Z85828 Personal history of other malignant neoplasm of skin: Secondary | ICD-10-CM | POA: Diagnosis not present

## 2020-08-30 DIAGNOSIS — R21 Rash and other nonspecific skin eruption: Secondary | ICD-10-CM | POA: Diagnosis not present

## 2020-08-30 DIAGNOSIS — H01134 Eczematous dermatitis of left upper eyelid: Secondary | ICD-10-CM

## 2020-08-30 DIAGNOSIS — L820 Inflamed seborrheic keratosis: Secondary | ICD-10-CM

## 2020-08-30 DIAGNOSIS — L814 Other melanin hyperpigmentation: Secondary | ICD-10-CM | POA: Diagnosis not present

## 2020-08-30 DIAGNOSIS — H01131 Eczematous dermatitis of right upper eyelid: Secondary | ICD-10-CM

## 2020-08-30 DIAGNOSIS — D2271 Melanocytic nevi of right lower limb, including hip: Secondary | ICD-10-CM | POA: Diagnosis not present

## 2020-08-30 DIAGNOSIS — L821 Other seborrheic keratosis: Secondary | ICD-10-CM

## 2020-08-30 NOTE — Progress Notes (Signed)
Follow-Up Visit   Subjective  Beverly [name] is a 75 y.o. female who presents for the following: Annual Exam (Yearly mole check ) and Dermatitis (3rd patch test reading ). Pt report no reactions on her back from the patch testing.  The patient presents for Total-Body Skin Exam (TBSE) for skin cancer screening and mole check.  The following portions of the chart were reviewed this encounter and updated as appropriate:   Tobacco  Allergies  Meds  Problems  Med Hx  Surg Hx  Fam Hx     Review of Systems:  No other skin or systemic complaints except as noted in HPI or Assessment and Plan.  Objective  Well appearing patient in no apparent distress; mood and affect are within normal limits.  A full examination was performed including scalp, head, eyes, ears, nose, lips, neck, chest, axillae, abdomen, back, buttocks, bilateral upper extremities, bilateral lower extremities, hands, feet, fingers, toes, fingernails, and toenails. All findings within normal limits unless otherwise noted below.  Objective  eyelids: Mild pink patches   Objective  Left chest sternum: Dyspigmented smooth macule or patch.   Objective  Right distal calf: Erythematous keratotic or waxy stuck-on papule or plaque.   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 Patch test x 36 (TrueTest) negative today. Previous Allergist testing last year was negative. Eyelid dermatitis with new onset nasal bridge dermatitis- improved with Elidel cream  Cont Elidel cream apply to affected skin once or twice a day  Consider biopsy or expanded patch testing in future, but do not recommend at this time.  Scar Left chest sternum Scar from previous [place]  Discussed treatment options Serica vs ILK injections  Serica sample given  Inflamed seborrheic keratosis Right distal calf Destruction of  lesion - Right distal calf Complexity: simple   Destruction method: cryotherapy   Informed consent: discussed and consent obtained   Timeout:  patient name, date of birth, surgical site, and procedure verified Lesion destroyed using liquid nitrogen: Yes   Region frozen until ice ball extended beyond lesion: Yes   Outcome: patient tolerated procedure well with no complications   Post-procedure details: wound care instructions given    Nevus Right anterior sole Suspect Nevus- pt report long hx of this mole no changes  Benign-appearing.  Observation.  Call clinic for new or changing lesions.  Recommend daily use of broad spectrum spf 30+ sunscreen to sun-exposed areas.   Rash - eyelids and skin  Upper Back Patch testing negative Discussed we could do an expanded patch test or biopsy, but do not recommend yet. Pt had allergist evaluation for environmental allergies with negative results    Lentigines - Scattered tan macules - Due to sun exposure - Benign-appering, observe - Recommend daily broad spectrum sunscreen SPF 30+ to sun-exposed areas, reapply every 2 hours as needed. - Call for any changes  Seborrheic Keratoses - Stuck-on, waxy, tan-brown papules and/or plaques  - Benign-appearing - Discussed benign etiology and prognosis. - Observe - Call for any changes  Melanocytic Nevi - Tan-brown and/or pink-flesh-colored symmetric macules and papules - Benign appearing on exam today - Observation - Call clinic for new or changing moles - Recommend daily use of broad spectrum spf 30+ sunscreen to sun-exposed areas.   Hemangiomas - Red papules - Discussed benign nature - Observe - Call for any changes  Actinic Damage -  Chronic condition, secondary to cumulative UV/sun exposure - diffuse scaly erythematous macules with underlying dyspigmentation - Recommend daily broad spectrum sunscreen SPF 30+ to sun-exposed areas, reapply every 2 hours as needed.  - Staying in the shade  or wearing long sleeves, sun glasses (UVA+UVB protection) and wide brim hats (4-inch brim around the entire circumference of the hat) are also recommended for sun protection.  - Call for new or changing lesions.  History of Basal Cell Carcinoma of the Skin Multiple See history - No evidence of recurrence today - Recommend regular full body skin exams - Recommend daily broad spectrum sunscreen SPF 30+ to sun-exposed areas, reapply every 2 hours as needed.  - Call if any new or changing lesions are noted between office visits  Skin cancer screening performed today.  Return in about 1 year (around 08/30/2021).  IMarye Round, CMA, am acting as scribe for Sarina Ser, MD .  Documentation: I have reviewed the above documentation for accuracy and completeness, and I agree with the above.  Sarina Ser, MD

## 2020-08-31 ENCOUNTER — Encounter: Payer: Self-pay | Admitting: Dermatology

## 2020-09-26 ENCOUNTER — Ambulatory Visit: Payer: Medicare Other | Admitting: Orthopaedic Surgery

## 2020-10-19 ENCOUNTER — Telehealth: Payer: Self-pay | Admitting: *Deleted

## 2020-10-19 DIAGNOSIS — M79645 Pain in left finger(s): Secondary | ICD-10-CM | POA: Diagnosis not present

## 2020-10-19 DIAGNOSIS — R030 Elevated blood-pressure reading, without diagnosis of hypertension: Secondary | ICD-10-CM | POA: Diagnosis not present

## 2020-10-19 DIAGNOSIS — Z87440 Personal history of urinary (tract) infections: Secondary | ICD-10-CM | POA: Diagnosis not present

## 2020-10-19 DIAGNOSIS — R1310 Dysphagia, unspecified: Secondary | ICD-10-CM | POA: Diagnosis not present

## 2020-10-19 DIAGNOSIS — Z0000 Encounter for general adult medical examination without abnormal findings: Secondary | ICD-10-CM | POA: Diagnosis not present

## 2020-10-19 DIAGNOSIS — K5900 Constipation, unspecified: Secondary | ICD-10-CM | POA: Diagnosis not present

## 2020-10-19 DIAGNOSIS — Z1389 Encounter for screening for other disorder: Secondary | ICD-10-CM | POA: Diagnosis not present

## 2020-10-19 DIAGNOSIS — E7800 Pure hypercholesterolemia, unspecified: Secondary | ICD-10-CM | POA: Diagnosis not present

## 2020-10-19 DIAGNOSIS — Z1211 Encounter for screening for malignant neoplasm of colon: Secondary | ICD-10-CM | POA: Diagnosis not present

## 2020-10-19 NOTE — Telephone Encounter (Signed)
Attempted 1 year Ortho bundle call S/P right total hip replacement. No answer and left VM requesting call back.

## 2020-10-23 ENCOUNTER — Other Ambulatory Visit: Payer: Self-pay | Admitting: Family Medicine

## 2020-10-23 DIAGNOSIS — E7800 Pure hypercholesterolemia, unspecified: Secondary | ICD-10-CM

## 2020-10-26 ENCOUNTER — Telehealth: Payer: Self-pay | Admitting: *Deleted

## 2020-10-26 NOTE — Telephone Encounter (Signed)
1 year Ortho bundle call for Right THA completed.

## 2020-11-16 ENCOUNTER — Ambulatory Visit
Admission: RE | Admit: 2020-11-16 | Discharge: 2020-11-16 | Disposition: A | Discharge status: Home / Self Care | Payer: Self-pay | Source: Ambulatory Visit | Attending: Family Medicine | Admitting: Family Medicine

## 2020-11-16 DIAGNOSIS — E7800 Pure hypercholesterolemia, unspecified: Secondary | ICD-10-CM

## 2020-11-24 DIAGNOSIS — I700 Atherosclerosis of aorta: Secondary | ICD-10-CM | POA: Diagnosis not present

## 2020-11-24 DIAGNOSIS — R030 Elevated blood-pressure reading, without diagnosis of hypertension: Secondary | ICD-10-CM | POA: Diagnosis not present

## 2020-11-24 DIAGNOSIS — R918 Other nonspecific abnormal finding of lung field: Secondary | ICD-10-CM | POA: Diagnosis not present

## 2020-11-24 DIAGNOSIS — I2510 Atherosclerotic heart disease of native coronary artery without angina pectoris: Secondary | ICD-10-CM | POA: Diagnosis not present

## 2020-11-24 DIAGNOSIS — J439 Emphysema, unspecified: Secondary | ICD-10-CM | POA: Diagnosis not present

## 2021-01-10 ENCOUNTER — Other Ambulatory Visit: Payer: Self-pay | Admitting: Physician Assistant

## 2021-01-10 DIAGNOSIS — R1900 Intra-abdominal and pelvic swelling, mass and lump, unspecified site: Secondary | ICD-10-CM

## 2021-01-10 DIAGNOSIS — Z86010 Personal history of colonic polyps: Secondary | ICD-10-CM | POA: Diagnosis not present

## 2021-01-31 DIAGNOSIS — E7800 Pure hypercholesterolemia, unspecified: Secondary | ICD-10-CM | POA: Diagnosis not present

## 2021-02-06 ENCOUNTER — Ambulatory Visit
Admission: RE | Admit: 2021-02-06 | Discharge: 2021-02-06 | Disposition: A | Discharge status: Home / Self Care | Payer: Medicare Other | Source: Ambulatory Visit | Attending: Physician Assistant | Admitting: Physician Assistant

## 2021-02-06 DIAGNOSIS — R1900 Intra-abdominal and pelvic swelling, mass and lump, unspecified site: Secondary | ICD-10-CM

## 2021-02-06 DIAGNOSIS — R1909 Other intra-abdominal and pelvic swelling, mass and lump: Secondary | ICD-10-CM | POA: Diagnosis not present

## 2021-02-06 DIAGNOSIS — N839 Noninflammatory disorder of ovary, fallopian tube and broad ligament, unspecified: Secondary | ICD-10-CM | POA: Diagnosis not present

## 2021-02-08 ENCOUNTER — Other Ambulatory Visit: Payer: Self-pay

## 2021-02-08 ENCOUNTER — Ambulatory Visit (INDEPENDENT_AMBULATORY_CARE_PROVIDER_SITE_OTHER): Payer: Medicare Other | Admitting: Emergency Medicine

## 2021-02-08 ENCOUNTER — Encounter: Payer: Self-pay | Admitting: Emergency Medicine

## 2021-02-08 VITALS — BP 126/82 | HR 97 | Temp 98.4°F | Ht 67.0 in | Wt 131.6 lb

## 2021-02-08 DIAGNOSIS — J432 Centrilobular emphysema: Secondary | ICD-10-CM | POA: Diagnosis not present

## 2021-02-08 DIAGNOSIS — R9389 Abnormal findings on diagnostic imaging of other specified body structures: Secondary | ICD-10-CM

## 2021-02-08 NOTE — Assessment & Plan Note (Signed)
Reviewed her CT scan of the chest with her today.  She had a coronary calcium scoring CT and had spurious findings that included scattered centrilobular emphysematous change, old lingular scar, some scattered bronchiectatic change with micronodular findings suggestive of possible atypical mycobacterial infection.    The focal emphysematous change is unusual given her minimal tobacco history.  Almost cystic although there does not appear to be a definitive thin or thick wall.  Would be very unusual for her to have a cystic lung disease, LAM, etc. at age 75.  Likewise would be unusual for her to have alpha-1 antitrypsin deficiency.  She does not have any HIV risk factors.  Not clear to me that the presence of atypical mycobacterial disease would cause emphysematous change.  For now we will perform pulmonary function testing to quantify her degree of obstruction.  She remains asymptomatic  She does have some micronodular disease with associated bronchiectasis.  Suspect she may be colonized with [facility].  She is asymptomatic and the findings are subtle.  Not clear that there would be significant clinical benefit to performing bronchoscopy and culture data, treating at this point.  Probably would be reasonable for [place] to continue to follow her clinically and consider repeat CT chest or even bronchoscopy depending on symptoms

## 2021-02-08 NOTE — Patient Instructions (Signed)
We reviewed your CT scan of the chest today. We will perform pulmonary function testing at your next office visit to assess your lung function We will perform lab work today Follow Dr. Lamonte Sakai next available with PFT.

## 2021-02-08 NOTE — Progress Notes (Signed)
Subjective:    Patient ID: Beverly [name], female    DOB: 11-24-1945, 75 y.o.   MRN: [id number]  HPI 75 year old former smoker (4 pack years) with a history of GERD and esophageal dysmotility, osteoarthritis, basal cell skin cancers.  She is referred today for evaluation of suspected emphysema and some small pulmonary nodules that were noted on cardiac CT scan of the chest.  No dyspnea, no limitations. Great functional capacity. She does have an occasional dry cough that she has ascribed to reflux, got better w PPI.  No sputum production. She has a lot rhinitis, probably non-allergic (allergy testing negative). She has had bronchitis before, PNA 5 yrs ago.   Cardiac CT 11/16/2020 reviewed by me, shows some bronchial wall thickening and mild emphysematous change, some regional cylindrical bronchiectasis and micro nodularity, question atypical infection or chronic mucoid impaction.    Review of Systems As per HPI  Past Medical History:  Diagnosis Date   Atypical mole 11/03/2013   R prox post lat thigh    Basal cell carcinoma    mid back   Basal cell carcinoma 04/01/2019   L chest parasternal    Basal cell carcinoma 09/11/2017   L mid back at braline 3.0 cm lat to spine   Esophageal dysmotility    GERD (gastroesophageal reflux disease)    occassional   OA (osteoarthritis)    hips and knees   Pneumonia    history of    PONV (postoperative nausea and vomiting)      Family History  Problem Relation Age of Onset   Allergic rhinitis Neg Hx    Angioedema Neg Hx    Asthma Neg Hx    Atopy Neg Hx    Eczema Neg Hx    Immunodeficiency Neg Hx    Urticaria Neg Hx     No hx COPD, Asthma, lung CA  Social History   Socioeconomic History   Marital status: Married    Spouse name: Not on file   Number of children: Not on file   Years of education: Not on file   Highest education level: Not on file  Occupational History   Not on file  Tobacco Use   Smoking status: Former    Types:  Cigarettes    Quit date: 12/27/1979    Years since quitting: 41.1   Smokeless tobacco: Never  Vaping Use   Vaping Use: Never used  Substance and Sexual Activity   Alcohol use: Yes    Alcohol/week: 3.0 standard drinks    Types: 3 Glasses of wine per week    Comment: weekly   Drug use: No   Sexual activity: Never    Birth control/protection: Post-menopausal  Other Topics Concern   Not on file  Social History Narrative   Not on file   Social Determinants of Health   Financial Resource Strain: Not on file  Food Insecurity: Not on file  Transportation Needs: Not on file  Physical Activity: Not on file  Stress: Not on file  Social Connections: Not on file  Intimate Partner Violence: Not on file    She worked in Museum/gallery exhibitions officer Has lived in [place], [place], [place], [place], [place], [place]   Allergies  Allergen Reactions   Macrodantin Hives   Sulfa Antibiotics Palpitations     Outpatient Medications Prior to Visit  Medication Sig Dispense Refill   bimatoprost (LATISSE) 0.03 % ophthalmic solution Place 1 application into both eyes at bedtime. Place one drop on applicator and apply evenly  along the skin of the upper eyelid at base of eyelashes once daily at bedtime; repeat procedure for second eye (use a clean applicator). 5 mL 6   estradiol (VIVELLE-DOT) 0.025 MG/24HR Place 1 patch onto the skin 2 (two) times a week. ALORA  (Wednesdays & Saturdays)     mometasone (ELOCON) 0.1 % lotion APPLY TO AFFECTED AREA UP TO 5 DAYS PER WEEK AS NEEDED 60 mL 1   OVER THE COUNTER MEDICATION Take 4-5 capsules by mouth at bedtime. [place] RHUBARB 400 mg/capsule     pantoprazole (PROTONIX) 40 MG tablet TAKE 1 TABLET BY MOUTH DAILY.  'OV NEEDED FOR ADDITIONAL REFILLS" (Patient taking differently: Take 40 mg by mouth every evening. TAKE 1 TABLET BY MOUTH DAILY.  'OV NEEDED FOR ADDITIONAL REFILLS") 30 tablet 0   Probiotic Product (PROBIOTIC PO) Take 1 capsule by mouth daily. 5 Billion CFUs     progesterone (PROMETRIUM)  100 MG capsule Take 100 mg by mouth every Monday, Wednesday, and Friday.      acetaminophen (TYLENOL) 650 MG CR tablet Take 1,300 mg by mouth every 8 (eight) hours as needed for pain.     Ascorbic Acid (VITAMIN C) 1000 MG tablet Take 1,000 mg by mouth daily. (Patient not taking: Reported on 02/08/2021)     Biotin 1000 MCG tablet Take 1,000 mcg by mouth daily. (Patient not taking: Reported on 02/08/2021)     Calcium Carb-Cholecalciferol (CALCIUM 600 + D PO) Take 1 tablet by mouth daily. (Patient not taking: Reported on 02/08/2021)     cephALEXin (KEFLEX) 250 MG capsule Take 250 mg by mouth at bedtime.     ibuprofen (ADVIL) 200 MG tablet Take 400 mg by mouth every 8 (eight) hours as needed.     Lysine 500 MG CAPS Take 500 mg by mouth daily. (Patient not taking: Reported on 02/08/2021)     methocarbamol (ROBAXIN) 500 MG tablet Take 1 tablet (500 mg total) by mouth every 6 (six) hours as needed for muscle spasms. 60 tablet 1   Multiple Vitamins-Minerals (MULTIVITAMIN WITH MINERALS) tablet Take 1 tablet by mouth daily. (Patient not taking: Reported on 02/08/2021)     oxyCODONE (ROXICODONE) 5 MG immediate release tablet Take 1 tablet (5 mg total) by mouth every 4 (four) hours as needed for severe pain. 30 tablet 0   pimecrolimus (ELIDEL) 1 % cream Apply topically 2 (two) times daily. Bid to aa eyelid for at least 2 wks, then can d/c if resolved 60 g 1   pyridoxine (B-6) 100 MG tablet Take 100 mg by mouth daily. (Patient not taking: Reported on 02/08/2021)     tiZANidine (ZANAFLEX) 4 MG tablet Take 1 tablet (4 mg total) by mouth every 6 (six) hours as needed for muscle spasms. 30 tablet 0   vitamin B-12 (CYANOCOBALAMIN) 1000 MCG tablet Take 1,000 mcg by mouth daily. (Patient not taking: Reported on 02/08/2021)     No facility-administered medications prior to visit.          Objective:   Physical Exam Vitals:   02/08/21 1505  BP: 126/82  Pulse: 97  Temp: 98.4 F (36.9 C)  TempSrc: Oral  SpO2: 96%   Weight: 131 lb 9.6 oz (59.7 kg)  Height: '5\' 7"'$  (1.702 m)   Gen: Pleasant, well-nourished, in no distress,  normal affect  ENT: No lesions,  mouth clear,  oropharynx clear, no postnasal drip  Neck: No JVD, no stridor  Lungs: No use of accessory muscles, no crackles or wheezing on normal  respiration, no wheeze on forced expiration  Cardiovascular: RRR, heart sounds normal, no murmur or gallops, no peripheral edema  Musculoskeletal: No deformities, no cyanosis or clubbing  Neuro: alert, awake, non focal  Skin: Warm, no lesions or rash      Assessment & Plan:  Abnormal CT of the chest Reviewed her CT scan of the chest with her today.  She had a coronary calcium scoring CT and had spurious findings that included scattered centrilobular emphysematous change, old lingular scar, some scattered bronchiectatic change with micronodular findings suggestive of possible atypical mycobacterial infection.    The focal emphysematous change is unusual given her minimal tobacco history.  Almost cystic although there does not appear to be a definitive thin or thick wall.  Would be very unusual for her to have a cystic lung disease, LAM, etc. at age 72.  Likewise would be unusual for her to have alpha-1 antitrypsin deficiency.  She does not have any HIV risk factors.  Not clear to me that the presence of atypical mycobacterial disease would cause emphysematous change.  For now we will perform pulmonary function testing to quantify her degree of obstruction.  She remains asymptomatic  She does have some micronodular disease with associated bronchiectasis.  Suspect she may be colonized with [facility].  She is asymptomatic and the findings are subtle.  Not clear that there would be significant clinical benefit to performing bronchoscopy and culture data, treating at this point.  Probably would be reasonable for [place] to continue to follow her clinically and consider repeat CT chest or even bronchoscopy depending on  symptoms  Time spent 60 minutes  Baltazar Apo, MD, PhD 02/08/2021, 4:42 PM [facility] [phone] or if no answer before 7:00PM call [phone] For any issues after 7:00PM please call eLink [phone]

## 2021-02-19 LAB — ALPHA-1 ANTITRYPSIN PHENOTYPE: A-1 Antitrypsin, Ser: 205 mg/dL — ABNORMAL HIGH (ref 83–199)

## 2021-03-18 DIAGNOSIS — Z23 Encounter for immunization: Secondary | ICD-10-CM | POA: Diagnosis not present

## 2021-03-29 ENCOUNTER — Ambulatory Visit: Payer: Medicare Other | Admitting: Emergency Medicine

## 2021-04-23 DIAGNOSIS — H6121 Impacted cerumen, right ear: Secondary | ICD-10-CM | POA: Diagnosis not present

## 2021-05-03 DIAGNOSIS — K5730 Diverticulosis of large intestine without perforation or abscess without bleeding: Secondary | ICD-10-CM | POA: Diagnosis not present

## 2021-05-03 DIAGNOSIS — K6389 Other specified diseases of intestine: Secondary | ICD-10-CM | POA: Diagnosis not present

## 2021-05-03 DIAGNOSIS — D123 Benign neoplasm of transverse colon: Secondary | ICD-10-CM | POA: Diagnosis not present

## 2021-05-03 DIAGNOSIS — Z86010 Personal history of colonic polyps: Secondary | ICD-10-CM | POA: Diagnosis not present

## 2021-05-03 DIAGNOSIS — D122 Benign neoplasm of ascending colon: Secondary | ICD-10-CM | POA: Diagnosis not present

## 2021-05-07 DIAGNOSIS — D122 Benign neoplasm of ascending colon: Secondary | ICD-10-CM | POA: Diagnosis not present

## 2021-05-07 DIAGNOSIS — K6389 Other specified diseases of intestine: Secondary | ICD-10-CM | POA: Diagnosis not present

## 2021-05-07 DIAGNOSIS — D123 Benign neoplasm of transverse colon: Secondary | ICD-10-CM | POA: Diagnosis not present

## 2021-05-16 ENCOUNTER — Ambulatory Visit: Payer: Medicare Other | Admitting: Emergency Medicine

## 2021-05-16 DIAGNOSIS — I700 Atherosclerosis of aorta: Secondary | ICD-10-CM | POA: Diagnosis not present

## 2021-06-07 DIAGNOSIS — Z79890 Hormone replacement therapy (postmenopausal): Secondary | ICD-10-CM | POA: Diagnosis not present

## 2021-06-07 DIAGNOSIS — Z01411 Encounter for gynecological examination (general) (routine) with abnormal findings: Secondary | ICD-10-CM | POA: Diagnosis not present

## 2021-06-07 DIAGNOSIS — Z124 Encounter for screening for malignant neoplasm of cervix: Secondary | ICD-10-CM | POA: Diagnosis not present

## 2021-06-07 DIAGNOSIS — Z01419 Encounter for gynecological examination (general) (routine) without abnormal findings: Secondary | ICD-10-CM | POA: Diagnosis not present

## 2021-06-07 DIAGNOSIS — Z6820 Body mass index (BMI) 20.0-20.9, adult: Secondary | ICD-10-CM | POA: Diagnosis not present

## 2021-06-07 DIAGNOSIS — K4090 Unilateral inguinal hernia, without obstruction or gangrene, not specified as recurrent: Secondary | ICD-10-CM | POA: Diagnosis not present

## 2021-06-07 DIAGNOSIS — R102 Pelvic and perineal pain: Secondary | ICD-10-CM | POA: Diagnosis not present

## 2021-06-07 DIAGNOSIS — N951 Menopausal and female climacteric states: Secondary | ICD-10-CM | POA: Diagnosis not present

## 2021-06-11 ENCOUNTER — Ambulatory Visit (INDEPENDENT_AMBULATORY_CARE_PROVIDER_SITE_OTHER): Payer: Medicare Other | Admitting: Dermatology

## 2021-06-11 ENCOUNTER — Other Ambulatory Visit: Payer: Self-pay

## 2021-06-11 DIAGNOSIS — C44729 Squamous cell carcinoma of skin of left lower limb, including hip: Secondary | ICD-10-CM

## 2021-06-11 DIAGNOSIS — D492 Neoplasm of unspecified behavior of bone, soft tissue, and skin: Secondary | ICD-10-CM

## 2021-06-11 DIAGNOSIS — L814 Other melanin hyperpigmentation: Secondary | ICD-10-CM | POA: Diagnosis not present

## 2021-06-11 DIAGNOSIS — C4492 Squamous cell carcinoma of skin, unspecified: Secondary | ICD-10-CM

## 2021-06-11 DIAGNOSIS — L820 Inflamed seborrheic keratosis: Secondary | ICD-10-CM | POA: Diagnosis not present

## 2021-06-11 DIAGNOSIS — L821 Other seborrheic keratosis: Secondary | ICD-10-CM | POA: Diagnosis not present

## 2021-06-11 HISTORY — DX: Squamous cell carcinoma of skin, unspecified: C44.92

## 2021-06-11 NOTE — Progress Notes (Signed)
° °  Follow-Up Visit   Subjective  Beverly [name] is a 76 y.o. female who presents for the following: check spot (L lower leg, has had for months, growing, itchy prn).  The patient has spots, moles and lesions to be evaluated, some may be new or changing and the patient has concerns that these could be cancer.  She has spot on R leg and L thigh she wants checked.  The spot on her thigh is itchy.  The following portions of the chart were reviewed this encounter and updated as appropriate:       Review of Systems:  No other skin or systemic complaints except as noted in HPI or Assessment and Plan.  Objective  Well appearing patient in no apparent distress; mood and affect are within normal limits.  A focused examination was performed including left lower leg. Relevant physical exam findings are noted in the Assessment and Plan.  L mid pretibia 1.0cm pink keratotic pap       L ant upper thigh x 1 Waxy tan patch with erythema    Assessment & Plan  Neoplasm of skin L mid pretibia  Skin / nail biopsy Type of biopsy: tangential   Informed consent: discussed and consent obtained   Anesthesia: the lesion was anesthetized in a standard fashion   Anesthesia comment:  Area prepped with alcohol Anesthetic:  1% lidocaine w/ epinephrine 1-100,000 buffered w/ 8.4% NaHCO3 Instrument used: flexible razor blade   Hemostasis achieved with: pressure, aluminum chloride and electrodesiccation   Outcome: patient tolerated procedure well    Destruction of lesion  Destruction method: electrodesiccation and curettage   Informed consent: discussed and consent obtained   Timeout:  patient name, date of birth, surgical site, and procedure verified Curettage performed in three different directions: Yes   Electrodesiccation performed over the curetted area: Yes   Final wound size (cm):  1 Hemostasis achieved with:  pressure, aluminum chloride and electrodesiccation Outcome: patient tolerated  procedure well with no complications   Post-procedure details: wound care instructions given   Additional details:  Mupirocin ointment and Bandaid applied    Specimen 1 - Surgical pathology Differential Diagnosis: D48.5 Hypertrophic AK r/o SCC  Check Margins: No 1.0cm pink keratotic pap EDC today  Inflamed seborrheic keratosis L ant upper thigh x 1  Destruction of lesion - L ant upper thigh x 1  Destruction method: cryotherapy   Informed consent: discussed and consent obtained   Lesion destroyed using liquid nitrogen: Yes   Region frozen until ice ball extended beyond lesion: Yes   Outcome: patient tolerated procedure well with no complications   Post-procedure details: wound care instructions given   Additional details:  Prior to procedure, discussed risks of blister formation, small wound, skin dyspigmentation, or rare scar following cryotherapy. Recommend Vaseline ointment to treated areas while healing.    Seborrheic Keratoses - Stuck-on, waxy, tan-brown patch R leg - Benign-appearing - Discussed benign etiology and prognosis. - Observe - Call for any changes - legs  Lentigines - Scattered tan macules - Due to sun exposure - Benign-appering, observe - Recommend daily broad spectrum sunscreen SPF 30+ to sun-exposed areas, reapply every 2 hours as needed. - Call for any changes  - legs  Return for as scheduled for [facility].  I, Othelia Pulling, RMA, am acting as scribe for Brendolyn Patty, MD . Documentation: I have reviewed the above documentation for accuracy and completeness, and I agree with the above.  Brendolyn Patty MD

## 2021-06-11 NOTE — Patient Instructions (Addendum)

## 2021-06-18 ENCOUNTER — Telehealth: Payer: Self-pay

## 2021-06-18 NOTE — Telephone Encounter (Signed)
-----   Message from Brendolyn Patty, MD sent at 06/18/2021 11:11 AM EST ----- Skin , left mid pretibia WELL DIFFERENTIATED SQUAMOUS CELL CARCINOMA, BASE INVOLVED  SCC skin cancer- already treated with EDC at time of biopsy    - please call patient

## 2021-06-18 NOTE — Telephone Encounter (Signed)
Left pt msg to call for bx results/sh 

## 2021-06-20 ENCOUNTER — Other Ambulatory Visit: Payer: Self-pay | Admitting: Obstetrics & Gynecology

## 2021-06-20 ENCOUNTER — Telehealth: Payer: Self-pay

## 2021-06-20 DIAGNOSIS — E2839 Other primary ovarian failure: Secondary | ICD-10-CM

## 2021-06-20 NOTE — Telephone Encounter (Signed)
Patient advised of BX results .aw 

## 2021-06-20 NOTE — Telephone Encounter (Signed)
-----   Message from Brendolyn Patty, MD sent at 06/18/2021 11:11 AM EST ----- Skin , left mid pretibia WELL DIFFERENTIATED SQUAMOUS CELL CARCINOMA, BASE INVOLVED  SCC skin cancer- already treated with EDC at time of biopsy    - please call patient

## 2021-06-28 DIAGNOSIS — K4090 Unilateral inguinal hernia, without obstruction or gangrene, not specified as recurrent: Secondary | ICD-10-CM | POA: Diagnosis not present

## 2021-07-18 ENCOUNTER — Other Ambulatory Visit: Payer: Self-pay | Admitting: Dermatology

## 2021-08-07 ENCOUNTER — Ambulatory Visit: Payer: Self-pay | Admitting: Surgery

## 2021-08-07 NOTE — H&P (View-Only) (Signed)
?Beverly [name] ?Beverly [name]  ? ?Referring Provider: Elveria Royals, MD ? ?Subjective  ? ?Chief Complaint: Hernia ? ? ?History of Present Illness: ? ? 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 This has been present for several months; she noticed a bulge looking down when she was in the shower. This reduces when she is supine. Denies any associated pain, GI or urinary symptoms. She is otherwise well, has a lot of events coming up this spring including her son's wedding. ?She had an abdominal ultrasound to evaluate this area, but it does not make any mention of the inguinal canal and did not seem to be done with the intention of assessing for hernia. ?She is interested in discussing options for repair. ?Her only previous abdominal surgery was a laparoscopic exploration for a left fallopian tube infection many years ago following tubal ligation.  ? ?Review of Systems: ?A complete review of systems was obtained from the patient. I have reviewed this information and discussed as appropriate with the patient. See HPI as well for other ROS. ? ?Medical History: ?History reviewed. No pertinent past medical history. ? ?There is no problem list on file for this patient. ? ?Past Surgical History:  ?Procedure Laterality Date  ? CONVERSION PREVIOUS HIP SURGERY TO TOTAL HIP ARTHROPLASTY 01/2019  ? COLON SURGERY  ? ? ?Allergies  ?Allergen Reactions  ? Nitrofurantoin Hives, Other (See Comments) and Rash  ? Rosuvastatin Other (See Comments)  ? Sulfa (Sulfonamide Antibiotics) Anxiety and Palpitations  ? ?Current Outpatient Medications on File Prior to Visit  ?Medication Sig Dispense Refill  ? pravastatin (PRAVACHOL) 10 MG tablet 1 tablet  ? ascorbic acid, vitamin C, (VITAMIN C) 1000 MG tablet Take 1,000 mg by mouth  ? cyanocobalamin, vitamin B-12, 2,500 mcg Tab Take by mouth  ? Lactobacillus acidophilus (PROBIOTIC) 10 billion cell Cap Take by mouth   ? multivitamin with minerals tablet Take by mouth  ? pantoprazole (PROTONIX) 40 MG DR tablet Take 1 tablet by mouth once daily  ? ?No current facility-administered medications on file prior to visit.  ? ?Family History  ?Problem Relation Age of Onset  ? Diabetes Father  ? ? ?Social History  ? ?Tobacco Use  ?Smoking Status Former  ? Types: Cigarettes  ?Smokeless Tobacco Never  ? ? ?Social History  ? ?Socioeconomic History  ? Marital status: Married  ?Tobacco Use  ? Smoking status: Former  ?Types: Cigarettes  ? Smokeless tobacco: Never  ?Substance and Sexual Activity  ? Alcohol use: Yes  ? Drug use: Never  ? ?Objective:  ? ?Vitals:  ?06/28/21 1055  ?BP: (!) 142/82  ?Pulse: 99  ?Temp: 37.3 ?C (99.2 ?F)  ?SpO2: 96%  ?Weight: 60.1 kg (132 lb 9.6 oz)  ?Height: 170.2 cm ('5\' 7"'$ )  ? ?Body mass index is 20.77 kg/m?. ? ?Alert and well-appearing ?Unlabored respirations ?Abdomen soft and nontender ?There is a small reducible right inguinal hernia with no tenderness or overlying skin changes. ? ?Assessment and Plan:  ?Diagnoses and all orders for this visit: ? ?Non-recurrent unilateral inguinal hernia without obstruction or gangrene ? ?I discussed options of open versus laparoscopic/robotic repair. We discussed the relevant anatomy and we discussed the technique of the procedures and expected postoperative course of each. Discussed risks of bleeding, infection, pain, scarring, injury to structures in the area including nerves, blood vessels, bowel, bladder, risk of chronic pain, hernia recurrence, risk of seroma or hematoma, urinary retention, and  risks of general anesthesia including cardiovascular, pulmonary, and thromboembolic complications. Given that this is a unilateral nonrecurrent hernia I recommend an open approach as the first-line. We also discussed the option of observation in the risks of increasing size or symptoms from the hernia, incarceration or strangulation and need for emergency surgery. Questions were  welcomed and answered. The patient does wish to proceed with surgery, but would like to wait until mid May or after given lots of important life events coming up. She will call in a month or 2 to inquire about scheduling.  ? ?Allyana Vogan Raquel James, MD  ?  ?

## 2021-08-07 NOTE — H&P (Signed)
?Beverly [name] ?[id number]  ? ?Referring Provider: Elveria Royals, MD ? ?Subjective  ? ?Chief Complaint: Hernia ? ? ?History of Present Illness: ? ? 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 This has been present for several months; she noticed a bulge looking down when she was in the shower. This reduces when she is supine. Denies any associated pain, GI or urinary symptoms. She is otherwise well, has a lot of events coming up this spring including her son's wedding. ?She had an abdominal ultrasound to evaluate this area, but it does not make any mention of the inguinal canal and did not seem to be done with the intention of assessing for hernia. ?She is interested in discussing options for repair. ?Her only previous abdominal surgery was a laparoscopic exploration for a left fallopian tube infection many years ago following tubal ligation.  ? ?Review of Systems: ?A complete review of systems was obtained from the patient. I have reviewed this information and discussed as appropriate with the patient. See HPI as well for other ROS. ? ?Medical History: ?History reviewed. No pertinent past medical history. ? ?There is no problem list on file for this patient. ? ?Past Surgical History:  ?Procedure Laterality Date  ? CONVERSION PREVIOUS HIP SURGERY TO TOTAL HIP ARTHROPLASTY 01/2019  ? COLON SURGERY  ? ? ?Allergies  ?Allergen Reactions  ? Nitrofurantoin Hives, Other (See Comments) and Rash  ? Rosuvastatin Other (See Comments)  ? Sulfa (Sulfonamide Antibiotics) Anxiety and Palpitations  ? ?Current Outpatient Medications on File Prior to Visit  ?Medication Sig Dispense Refill  ? pravastatin (PRAVACHOL) 10 MG tablet 1 tablet  ? ascorbic acid, vitamin C, (VITAMIN C) 1000 MG tablet Take 1,000 mg by mouth  ? cyanocobalamin, vitamin B-12, 2,500 mcg Tab Take by mouth  ? Lactobacillus acidophilus (PROBIOTIC) 10 billion cell Cap Take by mouth   ? multivitamin with minerals tablet Take by mouth  ? pantoprazole (PROTONIX) 40 MG DR tablet Take 1 tablet by mouth once daily  ? ?No current facility-administered medications on file prior to visit.  ? ?Family History  ?Problem Relation Age of Onset  ? Diabetes Father  ? ? ?Social History  ? ?Tobacco Use  ?Smoking Status Former  ? Types: Cigarettes  ?Smokeless Tobacco Never  ? ? ?Social History  ? ?Socioeconomic History  ? Marital status: Married  ?Tobacco Use  ? Smoking status: Former  ?Types: Cigarettes  ? Smokeless tobacco: Never  ?Substance and Sexual Activity  ? Alcohol use: Yes  ? Drug use: Never  ? ?Objective:  ? ?Vitals:  ?06/28/21 1055  ?BP: (!) 142/82  ?Pulse: 99  ?Temp: 37.3 ?C (99.2 ?F)  ?SpO2: 96%  ?Weight: 60.1 kg (132 lb 9.6 oz)  ?Height: 170.2 cm ('5\' 7"'$ )  ? ?Body mass index is 20.77 kg/m?. ? ?Alert and well-appearing ?Unlabored respirations ?Abdomen soft and nontender ?There is a small reducible right inguinal hernia with no tenderness or overlying skin changes. ? ?Assessment and Plan:  ?Diagnoses and all orders for this visit: ? ?Non-recurrent unilateral inguinal hernia without obstruction or gangrene ? ?I discussed options of open versus laparoscopic/robotic repair. We discussed the relevant anatomy and we discussed the technique of the procedures and expected postoperative course of each. Discussed risks of bleeding, infection, pain, scarring, injury to structures in the area including nerves, blood vessels, bowel, bladder, risk of chronic pain, hernia recurrence, risk of seroma or hematoma, urinary retention, and  risks of general anesthesia including cardiovascular, pulmonary, and thromboembolic complications. Given that this is a unilateral nonrecurrent hernia I recommend an open approach as the first-line. We also discussed the option of observation in the risks of increasing size or symptoms from the hernia, incarceration or strangulation and need for emergency surgery. Questions were  welcomed and answered. The patient does wish to proceed with surgery, but would like to wait until mid May or after given lots of important life events coming up. She will call in a month or 2 to inquire about scheduling.  ? ?Tilia Faso Raquel James, MD  ?  ?

## 2021-08-09 DIAGNOSIS — N3090 Cystitis, unspecified without hematuria: Secondary | ICD-10-CM | POA: Diagnosis not present

## 2021-08-09 DIAGNOSIS — N3000 Acute cystitis without hematuria: Secondary | ICD-10-CM | POA: Diagnosis not present

## 2021-08-13 NOTE — Patient Instructions (Signed)
DUE TO COVID-19 ONLY ONE VISITOR  (aged 76 and older)  IS ALLOWED TO COME WITH YOU AND STAY IN THE WAITING ROOM ONLY DURING PRE OP AND PROCEDURE.   **NO VISITORS ARE ALLOWED IN THE SHORT STAY AREA OR RECOVERY ROOM!!**      You are not required to quarantine, however you are required to wear a well-fitted mask when you are out and around people not in your household.  Hand Hygiene often Do NOT share personal items  Notify your provider if you are in close contact with someone who has COVID or you develop fever 100.4 or greater, new onset of sneezing, cough, sore throat, shortness of breath or body aches.    Your procedure is scheduled on: 08-22-21   Report to [facility] Main Entrance    Report to admitting at       [place] AM   Call this number if you have problems the morning of surgery [phone]   Do not eat food :After Midnight.   After Midnight you may have the following liquids until __0530 ____ AM/ DAY OF SURGERY  then nothing by mouth  Water Black Coffee (sugar ok, NO MILK/CREAM OR CREAMERS)  Tea (sugar ok, NO MILK/CREAM OR CREAMERS) regular and decaf                             Plain Jell-O (NO RED)                                           Fruit ices (not with fruit pulp, NO RED)                                     Popsicles (NO RED)                                                                  Juice: apple, WHITE grape, WHITE cranberry Sports drinks like Gatorade (NO RED) Clear broth(vegetable,chicken,beef)          FOLLOW  ANY BOWEL PREP AND ANY ADDITIONAL PRE OP INSTRUCTIONS YOU RECEIVED FROM YOUR SURGEON'S OFFICE!!!     Oral Hygiene is also important to reduce your risk of infection.                                    Remember - BRUSH YOUR TEETH THE MORNING OF SURGERY WITH YOUR REGULAR TOOTHPASTE   NO GUM CANDY OR MINTS   Do NOT smoke after Midnight   Take these medicines the morning of surgery with A SIP OF WATER: NONE                                   You may not have any metal on your body including hair pins, jewelry, and body piercing             Do not wear  make-up, lotions, powders, perfumes/cologne, or deodorant  Do not wear nail polish including gel and S&S, artificial/acrylic nails, or any other type of covering on natural nails including finger and toenails. If you have artificial nails, gel coating, etc. that needs to be removed by a nail salon please have this removed prior to surgery or surgery may need to be canceled/ delayed if the surgeon/ anesthesia feels like they are unable to be safely monitored.   Do not shave  48 hours prior to surgery.           Do not bring valuables to the hospital. [place].   Contacts, dentures or bridgework may not be worn into surgery.   Bring small overnight bag day of surgery.    Patients discharged on the day of surgery will not be allowed to drive home.  Someone NEEDS to stay with you for the first 24 hours after anesthesia.   Special Instructions: Bring a copy of your healthcare power of attorney and living will documents         the day of surgery if you haven't scanned them before.              Please read over the following fact sheets you were given: IF YOU HAVE QUESTIONS ABOUT YOUR PRE-OP INSTRUCTIONS PLEASE CALL [phone]     [facility] - Preparing for Surgery Before surgery, you can play an important role.  Because skin is not sterile, your skin needs to be as free of germs as possible.  You can reduce the number of germs on your skin by washing with CHG (chlorahexidine gluconate) soap before surgery.  CHG is an antiseptic cleaner which kills germs and bonds with the skin to continue killing germs even after washing. Please DO NOT use if you have an allergy to CHG or antibacterial soaps.  If your skin becomes reddened/irritated stop using the CHG and inform your nurse when you arrive at Short Stay. Do not shave (including  legs and underarms) for at least 48 hours prior to the first CHG shower.  You may shave your face/neck. Please follow these instructions carefully:  1.  Shower with CHG Soap the night before surgery and the  morning of Surgery.  2.  If you choose to wash your hair, wash your hair first as usual with your  normal  shampoo.  3.  After you shampoo, rinse your hair and body thoroughly to remove the  shampoo.                           4.  Use CHG as you would any other liquid soap.  You can apply chg directly  to the skin and wash                       Gently with a scrungie or clean washcloth.  5.  Apply the CHG Soap to your body ONLY FROM THE NECK DOWN.   Do not use on face/ open                           Wound or open sores. Avoid contact with eyes, ears mouth and genitals (private parts).  Wash face,  Genitals (private parts) with your normal soap.             6.  Wash thoroughly, paying special attention to the area where your surgery  will be performed.  7.  Thoroughly rinse your body with warm water from the neck down.  8.  DO NOT shower/wash with your normal soap after using and rinsing off  the CHG Soap.                9.  Pat yourself dry with a clean towel.            10.  Wear clean pajamas.            11.  Place clean sheets on your bed the night of your first shower and do not  sleep with pets. Day of Surgery : Do not apply any lotions/deodorants the morning of surgery.  Please wear clean clothes to the hospital/surgery center.  FAILURE TO FOLLOW THESE INSTRUCTIONS MAY RESULT IN THE CANCELLATION OF YOUR SURGERY PATIENT SIGNATURE_________________________________  NURSE SIGNATURE__________________________________  ________________________________________________________________________

## 2021-08-13 NOTE — Progress Notes (Signed)
PCP -  Cardiologist -   PPM/ICD -  Device Orders -  Rep Notified -   Chest x-ray -  EKG -  Stress Test -  ECHO -  Cardiac Cath -   Sleep Study -  CPAP -   Fasting Blood Sugar -  Checks Blood Sugar _____ times a day  Blood Thinner Instructions: Aspirin Instructions:  ERAS Protcol - PRE-SURGERY Ensure or G2-   COVID TEST- N/A COVID vaccine -  Activity-- Anesthesia review:   Patient denies shortness of breath, fever, cough and chest pain at PAT appointment   All instructions explained to the patient, with a verbal understanding of the material. Patient agrees to go over the instructions while at home for a better understanding. Patient also instructed to self quarantine after being tested for COVID-19. The opportunity to ask questions was provided.

## 2021-08-16 ENCOUNTER — Other Ambulatory Visit: Payer: Self-pay

## 2021-08-16 ENCOUNTER — Encounter (HOSPITAL_COMMUNITY)
Admission: RE | Admit: 2021-08-16 | Discharge: 2021-08-16 | Disposition: A | Discharge status: Home / Self Care | Payer: Medicare Other | Source: Ambulatory Visit | Attending: Surgery | Admitting: Surgery

## 2021-08-16 ENCOUNTER — Encounter (HOSPITAL_COMMUNITY): Payer: Self-pay

## 2021-08-16 VITALS — BP 145/88 | HR 72 | Temp 97.7°F | Resp 16 | Ht 67.0 in | Wt 134.0 lb

## 2021-08-16 DIAGNOSIS — Z01818 Encounter for other preprocedural examination: Secondary | ICD-10-CM

## 2021-08-16 DIAGNOSIS — Z01812 Encounter for preprocedural laboratory examination: Secondary | ICD-10-CM | POA: Diagnosis not present

## 2021-08-16 HISTORY — DX: Emphysema, unspecified: J43.9

## 2021-08-16 LAB — CBC
HCT: 43.3 % (ref 36.0–46.0)
Hemoglobin: 13.9 g/dL (ref 12.0–15.0)
MCH: 27.5 pg (ref 26.0–34.0)
MCHC: 32.1 g/dL (ref 30.0–36.0)
MCV: 85.6 fL (ref 80.0–100.0)
Platelets: 275 10*3/uL (ref 150–400)
RBC: 5.06 MIL/uL (ref 3.87–5.11)
RDW: 13.8 % (ref 11.5–15.5)
WBC: 8.8 10*3/uL (ref 4.0–10.5)
nRBC: 0 % (ref 0.0–0.2)

## 2021-08-21 ENCOUNTER — Encounter (HOSPITAL_COMMUNITY): Payer: Self-pay | Admitting: Surgery

## 2021-08-21 NOTE — Anesthesia Preprocedure Evaluation (Addendum)
Anesthesia Evaluation  ?Patient identified by MRN, date of birth, ID band ?Patient awake ? ? ? ?Reviewed: ?Allergy & Precautions, NPO status , Patient's Chart, lab work & pertinent test results ? ?History of Anesthesia Complications ?(+) PONV ? ?Airway ?Mallampati: I ? ?TM Distance: >3 FB ?Neck ROM: Full ? ? ? Dental ? ?(+) Dental Advisory Given ?  ?Pulmonary ?COPD, former smoker,  ?  ?breath sounds clear to auscultation ? ? ? ? ? ? Cardiovascular ?negative cardio ROS ? ? ?Rhythm:Regular Rate:Normal ? ? ?  ?Neuro/Psych ?negative neurological ROS ?   ? GI/Hepatic ?Neg liver ROS, GERD  Medicated and Controlled,  ?Endo/Other  ?negative endocrine ROS ? Renal/GU ?negative Renal ROS  ? ?  ?Musculoskeletal ? ?(+) Arthritis ,  ? Abdominal ?  ?Peds ? Hematology ?negative hematology ROS ?(+)   ?Anesthesia Other Findings ? ? Reproductive/Obstetrics ? ?  ? ? ? ? ? ? ? ? ? ? ? ? ? ?  ?  ? ? ? ? ? ? ? ?Anesthesia Physical ?Anesthesia Plan ? ?ASA: 2 ? ?Anesthesia Plan: General  ? ?Post-op Pain Management: Tylenol PO (pre-op)*  ? ?Induction: Intravenous ? ?PONV Risk Score and Plan: 4 or greater and Ondansetron, Dexamethasone and Treatment may vary due to age or medical condition ? ?Airway Management Planned: Oral ETT ? ?Additional Equipment: None ? ?Intra-op Plan:  ? ?Post-operative Plan: Extubation in OR ? ?Informed Consent: I have reviewed the patients History and Physical, chart, labs and discussed the procedure including the risks, benefits and alternatives for the proposed anesthesia with the patient or authorized representative who has indicated his/her understanding and acceptance.  ? ? ? ?Dental advisory given ? ?Plan Discussed with: CRNA and Surgeon ? ?Anesthesia Plan Comments:   ? ? ? ? ? ?Anesthesia Quick Evaluation ? ?

## 2021-08-22 ENCOUNTER — Other Ambulatory Visit: Payer: Self-pay

## 2021-08-22 ENCOUNTER — Encounter (HOSPITAL_COMMUNITY): Payer: Self-pay | Admitting: Surgery

## 2021-08-22 ENCOUNTER — Ambulatory Visit (HOSPITAL_COMMUNITY): Payer: Medicare Other | Admitting: Anesthesiology

## 2021-08-22 ENCOUNTER — Encounter (HOSPITAL_COMMUNITY)
Admission: RE | Disposition: A | Discharge status: Home / Self Care | Payer: Self-pay | Source: Home / Self Care | Attending: Surgery

## 2021-08-22 ENCOUNTER — Ambulatory Visit (HOSPITAL_COMMUNITY)
Admission: RE | Admit: 2021-08-22 | Discharge: 2021-08-22 | Disposition: A | Discharge status: Home / Self Care | Payer: Medicare Other | Attending: Surgery | Admitting: Surgery

## 2021-08-22 ENCOUNTER — Ambulatory Visit (HOSPITAL_BASED_OUTPATIENT_CLINIC_OR_DEPARTMENT_OTHER): Payer: Medicare Other | Admitting: Anesthesiology

## 2021-08-22 DIAGNOSIS — J449 Chronic obstructive pulmonary disease, unspecified: Secondary | ICD-10-CM | POA: Diagnosis not present

## 2021-08-22 DIAGNOSIS — K4090 Unilateral inguinal hernia, without obstruction or gangrene, not specified as recurrent: Secondary | ICD-10-CM

## 2021-08-22 DIAGNOSIS — M1990 Unspecified osteoarthritis, unspecified site: Secondary | ICD-10-CM | POA: Diagnosis not present

## 2021-08-22 DIAGNOSIS — K219 Gastro-esophageal reflux disease without esophagitis: Secondary | ICD-10-CM | POA: Insufficient documentation

## 2021-08-22 DIAGNOSIS — Z79899 Other long term (current) drug therapy: Secondary | ICD-10-CM | POA: Insufficient documentation

## 2021-08-22 DIAGNOSIS — Z87891 Personal history of nicotine dependence: Secondary | ICD-10-CM | POA: Diagnosis not present

## 2021-08-22 HISTORY — PX: INGUINAL HERNIA REPAIR: SHX194

## 2021-08-22 SURGERY — REPAIR, HERNIA, INGUINAL, ADULT
Anesthesia: General | Laterality: Right

## 2021-08-22 MED ORDER — DEXMEDETOMIDINE (PRECEDEX) IN NS 20 MCG/5ML (4 MCG/ML) IV SYRINGE
PREFILLED_SYRINGE | INTRAVENOUS | Status: DC | PRN
  Administered 2021-08-22: 4 ug via INTRAVENOUS

## 2021-08-22 MED ORDER — CHLORHEXIDINE GLUCONATE 0.12 % MT SOLN
15.0000 mL | Freq: Once | OROMUCOSAL | Status: AC
  Administered 2021-08-22: 15 mL via OROMUCOSAL

## 2021-08-22 MED ORDER — PROPOFOL 10 MG/ML IV BOLUS
INTRAVENOUS | Status: AC
  Filled 2021-08-22: qty 20

## 2021-08-22 MED ORDER — MIDAZOLAM HCL 2 MG/2ML IJ SOLN
INTRAMUSCULAR | Status: AC
  Filled 2021-08-22: qty 2

## 2021-08-22 MED ORDER — ACETAMINOPHEN 500 MG PO TABS: 1000.0000 mg | ORAL_TABLET | Freq: Once | ORAL | Status: DC

## 2021-08-22 MED ORDER — MEPERIDINE HCL 50 MG/ML IJ SOLN: 6.2500 mg | INTRAMUSCULAR | Status: DC | PRN

## 2021-08-22 MED ORDER — MIDAZOLAM HCL 5 MG/5ML IJ SOLN
INTRAMUSCULAR | Status: DC | PRN
  Administered 2021-08-22: 1 mg via INTRAVENOUS

## 2021-08-22 MED ORDER — BUPIVACAINE LIPOSOME 1.3 % IJ SUSP
20.0000 mL | Freq: Once | INTRAMUSCULAR | Status: DC
Start: 2021-08-22 — End: 2021-08-22

## 2021-08-22 MED ORDER — SUCCINYLCHOLINE CHLORIDE 200 MG/10ML IV SOSY
PREFILLED_SYRINGE | INTRAVENOUS | Status: AC
  Filled 2021-08-22: qty 10

## 2021-08-22 MED ORDER — LIDOCAINE 2% (20 MG/ML) 5 ML SYRINGE
INTRAMUSCULAR | Status: DC | PRN
  Administered 2021-08-22: 80 mg via INTRAVENOUS

## 2021-08-22 MED ORDER — ROCURONIUM BROMIDE 10 MG/ML (PF) SYRINGE
PREFILLED_SYRINGE | INTRAVENOUS | Status: AC
  Filled 2021-08-22: qty 10

## 2021-08-22 MED ORDER — EPHEDRINE SULFATE-NACL 50-0.9 MG/10ML-% IV SOSY
PREFILLED_SYRINGE | INTRAVENOUS | Status: DC | PRN
  Administered 2021-08-22 (×2): 5 mg via INTRAVENOUS

## 2021-08-22 MED ORDER — ONDANSETRON HCL 4 MG/2ML IJ SOLN
INTRAMUSCULAR | Status: AC
  Filled 2021-08-22: qty 4

## 2021-08-22 MED ORDER — ONDANSETRON HCL 4 MG PO TABS: 4.0000 mg | ORAL_TABLET | Freq: Three times a day (TID) | ORAL | 0 refills | Status: DC | PRN

## 2021-08-22 MED ORDER — ACETAMINOPHEN 500 MG PO TABS
1000.0000 mg | ORAL_TABLET | ORAL | Status: AC
  Administered 2021-08-22: 1000 mg via ORAL
  Filled 2021-08-22: qty 2

## 2021-08-22 MED ORDER — BUPIVACAINE LIPOSOME 1.3 % IJ SUSP
INTRAMUSCULAR | Status: DC | PRN
  Administered 2021-08-22: 20 mL

## 2021-08-22 MED ORDER — LIDOCAINE HCL (PF) 2 % IJ SOLN
INTRAMUSCULAR | Status: AC
  Filled 2021-08-22: qty 10

## 2021-08-22 MED ORDER — EPHEDRINE 5 MG/ML INJ
INTRAVENOUS | Status: AC
  Filled 2021-08-22: qty 5

## 2021-08-22 MED ORDER — FENTANYL CITRATE PF 50 MCG/ML IJ SOSY: 25.0000 ug | PREFILLED_SYRINGE | INTRAMUSCULAR | Status: DC | PRN

## 2021-08-22 MED ORDER — FENTANYL CITRATE (PF) 100 MCG/2ML IJ SOLN
INTRAMUSCULAR | Status: AC
Start: 2021-08-22 — End: ?
  Filled 2021-08-22: qty 2

## 2021-08-22 MED ORDER — SODIUM CHLORIDE 0.9% FLUSH: 3.0000 mL | Freq: Two times a day (BID) | INTRAVENOUS | Status: DC

## 2021-08-22 MED ORDER — CEFAZOLIN SODIUM-DEXTROSE 2-4 GM/100ML-% IV SOLN
2.0000 g | INTRAVENOUS | Status: AC
Start: 2021-08-22 — End: 2021-08-22
  Administered 2021-08-22: 2 g via INTRAVENOUS
  Filled 2021-08-22: qty 100

## 2021-08-22 MED ORDER — ACETAMINOPHEN 650 MG RE SUPP
650.0000 mg | RECTAL | Status: DC | PRN
Start: 2021-08-22 — End: 2021-08-22
  Filled 2021-08-22: qty 1

## 2021-08-22 MED ORDER — MIDAZOLAM HCL 2 MG/2ML IJ SOLN: 0.5000 mg | Freq: Once | INTRAMUSCULAR | Status: DC | PRN

## 2021-08-22 MED ORDER — SODIUM CHLORIDE 0.9 % IV SOLN: 250.0000 mL | INTRAVENOUS | Status: DC | PRN

## 2021-08-22 MED ORDER — DEXMEDETOMIDINE (PRECEDEX) IN NS 20 MCG/5ML (4 MCG/ML) IV SYRINGE
PREFILLED_SYRINGE | INTRAVENOUS | Status: AC
  Filled 2021-08-22: qty 5

## 2021-08-22 MED ORDER — HYDROMORPHONE HCL 1 MG/ML IJ SOLN
INTRAMUSCULAR | Status: AC
  Filled 2021-08-22: qty 1

## 2021-08-22 MED ORDER — BUPIVACAINE-EPINEPHRINE (PF) 0.25% -1:200000 IJ SOLN
INTRAMUSCULAR | Status: AC
  Filled 2021-08-22: qty 30

## 2021-08-22 MED ORDER — CHLORHEXIDINE GLUCONATE 4 % EX LIQD: 60.0000 mL | Freq: Once | CUTANEOUS | Status: DC

## 2021-08-22 MED ORDER — BUPIVACAINE LIPOSOME 1.3 % IJ SUSP
INTRAMUSCULAR | Status: AC
  Filled 2021-08-22: qty 20

## 2021-08-22 MED ORDER — PROPOFOL 10 MG/ML IV BOLUS
INTRAVENOUS | Status: DC | PRN
  Administered 2021-08-22: 120 mg via INTRAVENOUS

## 2021-08-22 MED ORDER — DEXAMETHASONE SODIUM PHOSPHATE 10 MG/ML IJ SOLN
INTRAMUSCULAR | Status: AC
  Filled 2021-08-22: qty 2

## 2021-08-22 MED ORDER — OXYCODONE HCL 5 MG PO TABS: 5.0000 mg | ORAL_TABLET | ORAL | Status: DC | PRN

## 2021-08-22 MED ORDER — HYDROMORPHONE HCL 1 MG/ML IJ SOLN
0.2500 mg | INTRAMUSCULAR | Status: DC | PRN
  Administered 2021-08-22 (×2): 0.5 mg via INTRAVENOUS

## 2021-08-22 MED ORDER — CHLORHEXIDINE GLUCONATE 4 % EX LIQD
60.0000 mL | Freq: Once | CUTANEOUS | Status: DC
Start: 2021-08-22 — End: 2021-08-22

## 2021-08-22 MED ORDER — ORAL CARE MOUTH RINSE: 15.0000 mL | Freq: Once | OROMUCOSAL | Status: AC

## 2021-08-22 MED ORDER — TRAMADOL HCL 50 MG PO TABS: 50.0000 mg | ORAL_TABLET | Freq: Four times a day (QID) | ORAL | 0 refills | Status: AC | PRN

## 2021-08-22 MED ORDER — FENTANYL CITRATE (PF) 100 MCG/2ML IJ SOLN
INTRAMUSCULAR | Status: DC | PRN
  Administered 2021-08-22: 100 ug via INTRAVENOUS

## 2021-08-22 MED ORDER — OXYCODONE HCL 5 MG PO TABS: 5.0000 mg | ORAL_TABLET | Freq: Once | ORAL | Status: DC | PRN

## 2021-08-22 MED ORDER — 0.9 % SODIUM CHLORIDE (POUR BTL) OPTIME
TOPICAL | Status: DC | PRN
  Administered 2021-08-22: 1000 mL

## 2021-08-22 MED ORDER — BUPIVACAINE-EPINEPHRINE 0.25% -1:200000 IJ SOLN
INTRAMUSCULAR | Status: DC | PRN
  Administered 2021-08-22: 30 mL

## 2021-08-22 MED ORDER — SUGAMMADEX SODIUM 200 MG/2ML IV SOLN
INTRAVENOUS | Status: DC | PRN
  Administered 2021-08-22: 200 mg via INTRAVENOUS

## 2021-08-22 MED ORDER — LACTATED RINGERS IV SOLN: INTRAVENOUS | Status: DC

## 2021-08-22 MED ORDER — SUGAMMADEX SODIUM 500 MG/5ML IV SOLN
INTRAVENOUS | Status: AC
  Filled 2021-08-22: qty 5

## 2021-08-22 MED ORDER — ONDANSETRON HCL 4 MG/2ML IJ SOLN
INTRAMUSCULAR | Status: DC | PRN
  Administered 2021-08-22: 4 mg via INTRAVENOUS

## 2021-08-22 MED ORDER — ROCURONIUM BROMIDE 10 MG/ML (PF) SYRINGE
PREFILLED_SYRINGE | INTRAVENOUS | Status: DC | PRN
  Administered 2021-08-22: 60 mg via INTRAVENOUS

## 2021-08-22 MED ORDER — DOCUSATE SODIUM 100 MG PO CAPS: 100.0000 mg | ORAL_CAPSULE | Freq: Two times a day (BID) | ORAL | 0 refills | Status: AC

## 2021-08-22 MED ORDER — DEXAMETHASONE SODIUM PHOSPHATE 10 MG/ML IJ SOLN
INTRAMUSCULAR | Status: DC | PRN
  Administered 2021-08-22: 8 mg via INTRAVENOUS

## 2021-08-22 MED ORDER — SODIUM CHLORIDE 0.9% FLUSH: 3.0000 mL | INTRAVENOUS | Status: DC | PRN

## 2021-08-22 MED ORDER — OXYCODONE HCL 5 MG/5ML PO SOLN: 5.0000 mg | Freq: Once | ORAL | Status: DC | PRN

## 2021-08-22 MED ORDER — ACETAMINOPHEN 325 MG PO TABS: 650.0000 mg | ORAL_TABLET | ORAL | Status: DC | PRN

## 2021-08-22 SURGICAL SUPPLY — 43 items
BAG COUNTER SPONGE SURGICOUNT (BAG) IMPLANT
BENZOIN TINCTURE PRP APPL 2/3 (GAUZE/BANDAGES/DRESSINGS) ×2 IMPLANT
BLADE SURG 15 STRL LF DISP TIS (BLADE) ×1 IMPLANT
BLADE SURG 15 STRL SS (BLADE) ×1
CHLORAPREP W/TINT 26 (MISCELLANEOUS) ×2 IMPLANT
CLSR STERI-STRIP ANTIMIC 1/2X4 (GAUZE/BANDAGES/DRESSINGS) ×1 IMPLANT
COVER SURGICAL LIGHT HANDLE (MISCELLANEOUS) ×2 IMPLANT
DRAIN PENROSE 0.5X18 (DRAIN) ×2 IMPLANT
DRAPE LAPAROSCOPIC ABDOMINAL (DRAPES) ×2 IMPLANT
ELECT REM PT RETURN 15FT ADLT (MISCELLANEOUS) ×2 IMPLANT
GAUZE SPONGE 4X4 12PLY STRL (GAUZE/BANDAGES/DRESSINGS) ×1 IMPLANT
GLOVE SURG ENC MOIS LTX SZ6 (GLOVE) ×2 IMPLANT
GLOVE SURG MICRO LTX SZ6 (GLOVE) ×2 IMPLANT
GLOVE SURG UNDER LTX SZ6.5 (GLOVE) ×2 IMPLANT
GOWN STRL REUS W/ TWL LRG LVL3 (GOWN DISPOSABLE) ×1 IMPLANT
GOWN STRL REUS W/ TWL XL LVL3 (GOWN DISPOSABLE) IMPLANT
GOWN STRL REUS W/TWL LRG LVL3 (GOWN DISPOSABLE) ×1
GOWN STRL REUS W/TWL XL LVL3 (GOWN DISPOSABLE)
KIT BASIN OR (CUSTOM PROCEDURE TRAY) ×2 IMPLANT
KIT TURNOVER KIT A (KITS) IMPLANT
MARKER SKIN DUAL TIP RULER LAB (MISCELLANEOUS) ×2 IMPLANT
MESH ULTRAPRO 3X6 7.6X15CM (Mesh General) ×1 IMPLANT
NEEDLE HYPO 22GX1.5 SAFETY (NEEDLE) ×2 IMPLANT
PACK BASIC VI WITH GOWN DISP (CUSTOM PROCEDURE TRAY) ×2 IMPLANT
PENCIL SMOKE EVACUATOR (MISCELLANEOUS) ×1 IMPLANT
SPIKE FLUID TRANSFER (MISCELLANEOUS) ×2 IMPLANT
SPONGE T-LAP 4X18 ~~LOC~~+RFID (SPONGE) ×2 IMPLANT
STRIP CLOSURE SKIN 1/2X4 (GAUZE/BANDAGES/DRESSINGS) ×2 IMPLANT
SUT ETHIBOND 0 MO6 C/R (SUTURE) ×2 IMPLANT
SUT MNCRL AB 4-0 PS2 18 (SUTURE) ×2 IMPLANT
SUT PDS AB 0 CT1 36 (SUTURE) ×4 IMPLANT
SUT SILK 3 0 (SUTURE) ×1
SUT SILK 3-0 18XBRD TIE 12 (SUTURE) ×1 IMPLANT
SUT VIC AB 3-0 SH 27 (SUTURE) ×2
SUT VIC AB 3-0 SH 27XBRD (SUTURE) ×2 IMPLANT
SUT VICRYL 0 UR6 27IN ABS (SUTURE) IMPLANT
SUT VICRYL 3 0 BR 18  UND (SUTURE) ×1
SUT VICRYL 3 0 BR 18 UND (SUTURE) ×1 IMPLANT
SYR CONTROL 10ML LL (SYRINGE) ×2 IMPLANT
TAPE CLOTH SURG 4X10 WHT LF (GAUZE/BANDAGES/DRESSINGS) ×1 IMPLANT
TOWEL OR 17X26 10 PK STRL BLUE (TOWEL DISPOSABLE) ×2 IMPLANT
TOWEL OR NON WOVEN STRL DISP B (DISPOSABLE) ×2 IMPLANT
TRAY FOLEY MTR SLVR 16FR STAT (SET/KITS/TRAYS/PACK) IMPLANT

## 2021-08-22 NOTE — Interval H&P Note (Signed)
History and Physical Interval Note: ? ?08/22/2021 ?8:01 AM ? ?[name]  has presented today for surgery, with the diagnosis of INGUINAL HERNIA.  The various methods of treatment have been discussed with the patient and family. After consideration of risks, benefits and other options for treatment, the patient has consented to  Procedure(s): ?OPEN RIGHT INGUINAL HERNIA REPAIR WITH MESH (Right) as a surgical intervention.  The patient's history has been reviewed, patient examined, no change in status, stable for surgery.  I have reviewed the patient's chart and labs.  Questions were answered to the patient's satisfaction.   ? ? ?Beverly [name] ? ? ?

## 2021-08-22 NOTE — Anesthesia Postprocedure Evaluation (Signed)
Anesthesia Post Note ? ?Patient: Beverly [name] ? ?Procedure(s) Performed: OPEN RIGHT INGUINAL HERNIA REPAIR WITH MESH (Right) ? ?  ? ?Patient location during evaluation: PACU ?Anesthesia Type: General ?Level of consciousness: awake and alert, oriented and patient cooperative ?Pain management: pain level controlled ?Vital Signs Assessment: post-procedure vital signs reviewed and stable ?Respiratory status: spontaneous breathing, nonlabored ventilation and respiratory function stable ?Cardiovascular status: blood pressure returned to baseline and stable ?Postop Assessment: no apparent nausea or vomiting and adequate PO intake ?Anesthetic complications: no ? ? ?No notable events documented. ? ?Last Vitals:  ?Vitals:  ? 08/22/21 1000 08/22/21 1015  ?BP: (!) 141/72 125/86  ?Pulse: 69 68  ?Resp: 15 12  ?Temp:    ?SpO2: 100% 95%  ?  ?Last Pain:  ?Vitals:  ? 08/22/21 1015  ?TempSrc:   ?PainSc: 3   ? ? ?  ?  ?  ?  ?  ?  ? ?Savas Elvin,E. Wendy Mikles ? ? ? ? ?

## 2021-08-22 NOTE — Op Note (Signed)
Operative Note ? ?Beverly [name]  ?[id number]  ?[id number]  ?08/22/2021 ? ? ?Surgeon: Romana Juniper MD FACS ?  ?Procedure performed: Open inguinal hernia repair with UltraPro mesh: right, indirect ?  ?Preop diagnosis:  right inguinal hernia ?  ?Post-op diagnosis/intraop findings: Indirect right inguinal hernia ?  ?Specimens: none ?  ?EBL: 5cc ?  ?Complications: none ?  ?Description of procedure: After confirming informed consent the patient was taken to the operating room and placed supine on operating room table where general anesthesia was initiated, preoperative antibiotics were administered, SCDs applied, and a formal timeout was performed. The groin was prepped and draped in the usual sterile fashion. An oblique incision was made in the just above the inguinal ligament after infiltrating the tissues with local anesthetic (Exparel mixed with quarter percent Marcaine with epinephrine). Soft tissues were dissected using electrocautery until the external oblique aponeurosis was encountered. This was divided sharply to expand the external ring. A plane was bluntly developed between the round ligament and the external oblique. The ilioinguinal nerve was identified, divided between hemostats, and each and ligated with 3-0 Vicryl ties. The round ligament was then bluntly dissected away from the pubic tubercle and encircled with a Penrose. Inspection of the inguinal anatomy revealed a small indirect hernia sac. The indirect hernia sac was bluntly dissected away from the round ligament and internal oblique fibers and then was reduced into the abdominal cavity intact.  The round ligament was divided between hemostats, ligating each and with 3-0 Vicryl ties.  The inguinal floor was reconstructed, closing the internal ring, with interrupted 0 PDS sutures.  A 3 x 6 piece of ultra Pro mesh was brought onto the field and trimmed to approximate the field. This was sutured to the pubic tubercle fascia using 0 PDS. Interrupted 0  ethibonds were then used to secure the mesh to the inferior shelving edge and to the internal oblique superiorly.  Additional 0 Ethibonds were placed to fix the mesh down to the inguinal floor repair.  Hemostasis was ensured within the wound. The external oblique aponeurosis was reapproximated with a running 3-0 Vicryl to re-create a narrowed external ring. More local was infiltrated around the pubic tubercle and in the plane just below the external oblique. The Scarpa's was reapproximated with interrupted 3-0 Vicryls. The skin was closed with a running subcuticular 4-0 Monocryl. The remainder of the local was injected in the subcutaneous and subcuticular space. The field was then cleaned, benzoin and Steri-Strips and sterile bandage were applied. The patient was then awakened, extubated and taken to PACU in stable condition.  ?  ?All counts were correct at the completion of the case ? ?

## 2021-08-22 NOTE — Transfer of Care (Signed)
Immediate Anesthesia Transfer of Care Note ? ?Patient: Beverly [name] ? ?Procedure(s) Performed: Procedure(s): ?OPEN RIGHT INGUINAL HERNIA REPAIR WITH MESH (Right) ? ?Patient Location: PACU ? ?Anesthesia Type:General ? ?Level of Consciousness:  sedated, patient cooperative and responds to stimulation ? ?Airway & Oxygen Therapy:Patient Spontanous Breathing and Patient connected to face mask oxgen ? ?Post-op Assessment:  Report given to PACU RN and Post -op Vital signs reviewed and stable ? ?Post vital signs:  Reviewed and stable ? ?Last Vitals:  ?Vitals:  ? 08/22/21 0643  ?BP: (!) 155/86  ?Pulse: 67  ?Resp: 19  ?Temp: 36.4 ?C  ?SpO2: 99%  ? ? ?Complications: No apparent anesthesia complications ? ?

## 2021-08-22 NOTE — Discharge Instructions (Signed)
HERNIA REPAIR: POST OP INSTRUCTIONS ? ? ?EAT ?Gradually transition to a high fiber diet with a fiber supplement over the next few weeks after discharge.  Start with a pureed / full liquid diet (see below) ? ?WALK ?Walk an hour a day (cumulative- not all at once).  Control your pain to do that.   ? ?CONTROL PAIN ?Control pain so that you can walk, sleep, tolerate sneezing/coughing, and go up/down stairs. ? ?HAVE A BOWEL MOVEMENT DAILY ?Keep your bowels regular to avoid problems.  OK to try a laxative to override constipation.  OK to use an antidairrheal to slow down diarrhea.  Call if not better after 2 tries ? ?CALL IF YOU HAVE PROBLEMS/CONCERNS ?Call if you are still struggling despite following these instructions. ?Call if you have concerns not answered by these instructions ? ?###################################################################### ? ? ? ?DIET: Follow a light bland diet & liquids the first 24 hours after arrival home, such as soup, liquids, starches, etc.  Be sure to drink plenty of fluids.  Quickly advance to a usual solid diet within a few days.  Avoid fast food or heavy meals as your are more likely to get nauseated or have irregular bowels.  A low-sugar, high-fiber diet for the rest of your life is ideal.  ? ?Take your usually prescribed home medications unless otherwise directed. ? ?PAIN CONTROL: ?Pain is best controlled by a usual combination of three different methods TOGETHER: ?Ice/Heat ?Over the counter pain medication ?Prescription pain medication ?Most patients will experience some swelling and bruising around the hernia(s) such as the bellybutton, groins, or old incisions.  Ice packs or heating pads (30-60 minutes up to 6 times a day) will help. Use ice for the first few days to help decrease swelling and bruising, then switch to heat to help relax tight/sore spots and speed recovery.  Some people prefer to use ice alone, heat alone, alternating between ice & heat.  Experiment to what  works for you.  Swelling and bruising can take several weeks to resolve.   ?It is helpful to take an over-the-counter pain medication regularly for the first days: ?Naproxen (Aleve, etc)  Two '220mg'$  tabs twice a day OR Ibuprofen (Advil, etc) Three '200mg'$  tabs four times a day (every meal & bedtime) AND ?Acetaminophen (Tylenol, etc) 325-'650mg'$  four times a day (every meal & bedtime) ?A  prescription for pain medication should be given to you upon discharge.  Take your pain medication as prescribed, IF NEEDED.  ?If you are having problems/concerns with the prescription medicine (does not control pain, nausea, vomiting, rash, itching, etc), please call us [phone] to see if we need to switch you to a different pain medicine that will work better for you and/or control your side effect better. ?If you need a refill on your pain medication, please contact your pharmacy.  They will contact our office to request authorization. Prescriptions will not be filled after 5 pm or on week-ends. ? ?Avoid getting constipated.  Between the surgery and the pain medications, it is common to experience some constipation.  Increasing fluid intake and taking a fiber supplement (such as Metamucil, Citrucel, FiberCon, MiraLax, etc) 1-2 times a day regularly will usually help prevent this problem from occurring.  A mild laxative (prune juice, Milk of Magnesia, MiraLax, etc) should be taken according to package directions if there are no bowel movements after 48 hours.   ? ?Wash / shower every day, starting 2 days after surgery.  You may shower over the  steri strips which are waterproof.   ? ?Remove your outer bandage 2 days after surgery. Steri strips will peel off after 1-2 weeks.  You may leave the incision open to air.  You may replace a dressing/Band-Aid to cover an incision for comfort if you wish.  Continue to shower over incision(s) after the dressing is off. ? ?ACTIVITIES as tolerated:   ?You may resume regular (light) daily  activities beginning the next day--such as daily self-care, walking, climbing stairs--gradually increasing activities as tolerated.  Control your pain so that you can walk an hour a day.  If you can walk 30 minutes without difficulty, it is safe to try more intense activity such as jogging, treadmill, bicycling, low-impact aerobics, swimming, etc. ?Refrain from the most intensive and strenuous activity such as sit-ups, heavy lifting, contact sports, etc  Refrain from any heavy lifting or straining until 6 weeks after surgery.   ?DO NOT PUSH THROUGH PAIN.  Let pain be your guide: If it hurts to do something, don't do it.  Pain is your body warning you to avoid that activity for another week until the pain goes down. ?You may drive when you are no longer taking prescription pain medication, you can comfortably wear a seatbelt, and you can safely maneuver your car and apply brakes. ?You may have sexual intercourse when it is comfortable.  ? ?FOLLOW UP in our office ?Please call [facility] at [phone] to set up an appointment to see your surgeon in the office for a follow-up appointment approximately 2-3 weeks after your surgery. ?Make sure that you call for this appointment the day you arrive home to insure a convenient appointment time. ? ?9.  If you have disability of FMLA / Family leave forms, please bring the forms to the office for processing.  (do not give to your surgeon). ? ?WHEN TO CALL us [phone]: ?Poor pain control ?Reactions / problems with new medications (rash/itching, nausea, etc)  ?Fever over 101.5 F (38.5 C) ?Inability to urinate ?Nausea and/or vomiting ?Worsening swelling or bruising ?Continued bleeding from incision. ?Increased pain, redness, or drainage from the incision ? ? The clinic staff is available to answer your questions during regular business hours (8:30am-5pm).  Please don?t hesitate to call and ask to speak to one of our nurses for clinical concerns.  ? If you have a medical  emergency, go to the nearest emergency room or call 911. ? A surgeon from [facility] is always on call at the hospitals in [place] ? ?[facility], [place] ?[address] [place], [place], [place]  [address] ? ? [address], [place], [place]   [address] ?MAIN: [phone] ? TOLL FREE: [phone] ? FAX: [phone] ?[email or web address] ? ?

## 2021-08-22 NOTE — Anesthesia Procedure Notes (Signed)
Procedure Name: Intubation ?Date/Time: 08/22/2021 8:46 AM ?Performed by: Lavina Hamman, CRNA ?Pre-anesthesia Checklist: Patient identified, Emergency Drugs available, Suction available, Patient being monitored and Timeout performed ?Patient Re-evaluated:Patient Re-evaluated prior to induction ?Oxygen Delivery Method: Circle system utilized ?Preoxygenation: Pre-oxygenation with 100% oxygen ?Induction Type: IV induction ?Ventilation: Mask ventilation without difficulty ?Laryngoscope Size: Mac and 3 ?Grade View: Grade I ?Tube type: Oral ?Tube size: 7.0 mm ?Number of attempts: 1 ?Airway Equipment and Method: Stylet ?Placement Confirmation: ETT inserted through vocal cords under direct vision, positive ETCO2, CO2 detector and breath sounds checked- equal and bilateral ?Secured at: 21 cm ?Tube secured with: Tape ?Dental Injury: Teeth and Oropharynx as per pre-operative assessment  ?Comments: ATOI ? ? ? ? ?

## 2021-08-23 ENCOUNTER — Encounter (HOSPITAL_COMMUNITY): Payer: Self-pay | Admitting: Surgery

## 2021-09-05 ENCOUNTER — Ambulatory Visit (INDEPENDENT_AMBULATORY_CARE_PROVIDER_SITE_OTHER): Payer: Medicare Other | Admitting: Dermatology

## 2021-09-05 DIAGNOSIS — L821 Other seborrheic keratosis: Secondary | ICD-10-CM | POA: Diagnosis not present

## 2021-09-05 DIAGNOSIS — L814 Other melanin hyperpigmentation: Secondary | ICD-10-CM

## 2021-09-05 DIAGNOSIS — Z1283 Encounter for screening for malignant neoplasm of skin: Secondary | ICD-10-CM | POA: Diagnosis not present

## 2021-09-05 DIAGNOSIS — L820 Inflamed seborrheic keratosis: Secondary | ICD-10-CM

## 2021-09-05 DIAGNOSIS — Z86018 Personal history of other benign neoplasm: Secondary | ICD-10-CM | POA: Diagnosis not present

## 2021-09-05 DIAGNOSIS — Z85828 Personal history of other malignant neoplasm of skin: Secondary | ICD-10-CM | POA: Diagnosis not present

## 2021-09-05 DIAGNOSIS — D1800 Hemangioma unspecified site: Secondary | ICD-10-CM | POA: Diagnosis not present

## 2021-09-05 DIAGNOSIS — L578 Other skin changes due to chronic exposure to nonionizing radiation: Secondary | ICD-10-CM | POA: Diagnosis not present

## 2021-09-05 DIAGNOSIS — D229 Melanocytic nevi, unspecified: Secondary | ICD-10-CM

## 2021-09-05 NOTE — Progress Notes (Signed)
? ?Follow-Up Visit ?  ?Subjective  ?Beverly [name] is a 76 y.o. female who presents for the following: Annual Exam (The patient presents for Total-Body Skin Exam (TBSE) for skin cancer screening and mole check.  The patient has spots, moles and lesions to be evaluated, some may be new or changing and the patient has concerns that these could be cancer. ). ? ?The following portions of the chart were reviewed this encounter and updated as appropriate:  ? Tobacco  Allergies  Meds  Problems  Med Hx  Surg Hx  Fam Hx   ?  ?Review of Systems:  No other skin or systemic complaints except as noted in HPI or Assessment and Plan. ? ?Objective  ?Well appearing patient in no apparent distress; mood and affect are within normal limits. ? ?A full examination was performed including scalp, head, eyes, ears, nose, lips, neck, chest, axillae, abdomen, back, buttocks, bilateral upper extremities, bilateral lower extremities, hands, feet, fingers, toes, fingernails, and toenails. All findings within normal limits unless otherwise noted below. ? ?back x 9, right thigh x 1  (10) (10) ?Stuck-on, waxy, tan-brown papules ? ? ?Assessment & Plan  ?Inflamed seborrheic keratosis (10) ?back x 9, right thigh x 1  (10) ?Reassured benign age-related growth.  Recommend observation.  Discussed cryotherapy if spot(s) become irritated or inflamed.  ? ?Destruction of lesion - back x 9, right thigh x 1  (10) ?Complexity: simple   ?Destruction method: cryotherapy   ?Informed consent: discussed and consent obtained   ?Timeout:  patient name, date of birth, surgical site, and procedure verified ?Lesion destroyed using liquid nitrogen: Yes   ?Region frozen until ice ball extended beyond lesion: Yes   ?Outcome: patient tolerated procedure well with no complications   ?Post-procedure details: wound care instructions given   ? ?Lentigines ?- Scattered tan macules ?- Due to sun exposure ?- Benign-appearing, observe ?- Recommend daily broad spectrum  sunscreen SPF 30+ to sun-exposed areas, reapply every 2 hours as needed. ?- Call for any changes ? ?Seborrheic Keratoses ?- Stuck-on, waxy, tan-brown papules and/or plaques  ?- Benign-appearing ?- Discussed benign etiology and prognosis. ?- Observe ?- Call for any changes ? ?Melanocytic Nevi ?- Tan-brown and/or pink-flesh-colored symmetric macules and papules ?- Benign appearing on exam today ?- Observation ?- Call clinic for new or changing moles ?- Recommend daily use of broad spectrum spf 30+ sunscreen to sun-exposed areas.  ? ?Hemangiomas ?- Red papules ?- Discussed benign nature ?- Observe ?- Call for any changes ? ?Actinic Damage ?- Chronic condition, secondary to cumulative UV/sun exposure ?- diffuse scaly erythematous macules with underlying dyspigmentation ?- Recommend daily broad spectrum sunscreen SPF 30+ to sun-exposed areas, reapply every 2 hours as needed.  ?- Staying in the shade or wearing long sleeves, sun glasses (UVA+UVB protection) and wide brim hats (4-inch brim around the entire circumference of the hat) are also recommended for sun protection.  ?- Call for new or changing lesions. ? ?History of Dysplastic Nevi ?Right prox lat thigh 2015 ?- No evidence of recurrence today ?- Recommend regular full body skin exams ?- Recommend daily broad spectrum sunscreen SPF 30+ to sun-exposed areas, reapply every 2 hours as needed.  ?- Call if any new or changing lesions are noted between office visits  ? ?History of Basal Cell Carcinoma of the Skin ?Multiple see history  ?- No evidence of recurrence today ?- Recommend regular full body skin exams ?- Recommend daily broad spectrum sunscreen SPF 30+ to sun-exposed areas, reapply every  2 hours as needed.  ?- Call if any new or changing lesions are noted between office visits  ? ?History of Squamous Cell Carcinoma of the Skin ?Left mid pretibial 06/11/2021 ?- No evidence of recurrence today ?- No lymphadenopathy ?- Recommend regular full body skin exams ?-  Recommend daily broad spectrum sunscreen SPF 30+ to sun-exposed areas, reapply every 2 hours as needed.  ?- Call if any new or changing lesions are noted between office visits ? ? Skin cancer screening performed today.  ? ?Return in about 1 year (around 09/06/2022) for TBSE, hx of SCC, hx of BCC, hx of Dysplastic nevus . ? ?I, Marye Round, CMA, am acting as scribe for Sarina Ser, MD .  ?Documentation: I have reviewed the above documentation for accuracy and completeness, and I agree with the above. ? ?Sarina Ser, MD ? ?

## 2021-09-05 NOTE — Patient Instructions (Addendum)

## 2021-09-06 ENCOUNTER — Encounter: Payer: Self-pay | Admitting: Dermatology

## 2021-10-12 DIAGNOSIS — N3020 Other chronic cystitis without hematuria: Secondary | ICD-10-CM | POA: Diagnosis not present

## 2021-11-15 ENCOUNTER — Other Ambulatory Visit: Payer: Self-pay | Admitting: Family Medicine

## 2021-11-15 DIAGNOSIS — R918 Other nonspecific abnormal finding of lung field: Secondary | ICD-10-CM

## 2021-11-21 ENCOUNTER — Ambulatory Visit
Admission: RE | Admit: 2021-11-21 | Discharge: 2021-11-21 | Disposition: A | Discharge status: Home / Self Care | Payer: Medicare Other | Source: Ambulatory Visit | Attending: Obstetrics & Gynecology | Admitting: Obstetrics & Gynecology

## 2021-11-21 DIAGNOSIS — Z780 Asymptomatic menopausal state: Secondary | ICD-10-CM | POA: Diagnosis not present

## 2021-11-21 DIAGNOSIS — E2839 Other primary ovarian failure: Secondary | ICD-10-CM

## 2021-11-26 DIAGNOSIS — E7800 Pure hypercholesterolemia, unspecified: Secondary | ICD-10-CM | POA: Diagnosis not present

## 2021-11-26 DIAGNOSIS — R946 Abnormal results of thyroid function studies: Secondary | ICD-10-CM | POA: Diagnosis not present

## 2021-11-26 DIAGNOSIS — I700 Atherosclerosis of aorta: Secondary | ICD-10-CM | POA: Diagnosis not present

## 2021-11-26 DIAGNOSIS — J439 Emphysema, unspecified: Secondary | ICD-10-CM | POA: Diagnosis not present

## 2021-11-26 DIAGNOSIS — I2510 Atherosclerotic heart disease of native coronary artery without angina pectoris: Secondary | ICD-10-CM | POA: Diagnosis not present

## 2021-11-26 DIAGNOSIS — Z87440 Personal history of urinary (tract) infections: Secondary | ICD-10-CM | POA: Diagnosis not present

## 2021-11-26 DIAGNOSIS — Z1211 Encounter for screening for malignant neoplasm of colon: Secondary | ICD-10-CM | POA: Diagnosis not present

## 2021-11-26 DIAGNOSIS — Z0000 Encounter for general adult medical examination without abnormal findings: Secondary | ICD-10-CM | POA: Diagnosis not present

## 2021-11-26 DIAGNOSIS — K5900 Constipation, unspecified: Secondary | ICD-10-CM | POA: Diagnosis not present

## 2021-11-26 DIAGNOSIS — R1310 Dysphagia, unspecified: Secondary | ICD-10-CM | POA: Diagnosis not present

## 2021-11-26 DIAGNOSIS — Z1331 Encounter for screening for depression: Secondary | ICD-10-CM | POA: Diagnosis not present

## 2021-12-13 ENCOUNTER — Other Ambulatory Visit: Payer: Medicare Other

## 2021-12-13 ENCOUNTER — Ambulatory Visit
Admission: RE | Admit: 2021-12-13 | Discharge: 2021-12-13 | Disposition: A | Discharge status: Home / Self Care | Payer: Medicare Other | Source: Ambulatory Visit | Attending: Family Medicine | Admitting: Family Medicine

## 2021-12-13 DIAGNOSIS — J439 Emphysema, unspecified: Secondary | ICD-10-CM | POA: Diagnosis not present

## 2021-12-13 DIAGNOSIS — R911 Solitary pulmonary nodule: Secondary | ICD-10-CM | POA: Diagnosis not present

## 2021-12-13 DIAGNOSIS — R918 Other nonspecific abnormal finding of lung field: Secondary | ICD-10-CM

## 2022-01-03 DIAGNOSIS — R946 Abnormal results of thyroid function studies: Secondary | ICD-10-CM | POA: Diagnosis not present

## 2022-02-18 ENCOUNTER — Ambulatory Visit: Payer: Self-pay

## 2022-02-18 ENCOUNTER — Ambulatory Visit (INDEPENDENT_AMBULATORY_CARE_PROVIDER_SITE_OTHER): Payer: Medicare Other

## 2022-02-18 ENCOUNTER — Ambulatory Visit (INDEPENDENT_AMBULATORY_CARE_PROVIDER_SITE_OTHER): Payer: Medicare Other | Admitting: Orthopaedic Surgery

## 2022-02-18 DIAGNOSIS — M2241 Chondromalacia patellae, right knee: Secondary | ICD-10-CM

## 2022-02-18 DIAGNOSIS — M25561 Pain in right knee: Secondary | ICD-10-CM | POA: Diagnosis not present

## 2022-02-18 DIAGNOSIS — M2242 Chondromalacia patellae, left knee: Secondary | ICD-10-CM | POA: Diagnosis not present

## 2022-02-18 DIAGNOSIS — G8929 Other chronic pain: Secondary | ICD-10-CM | POA: Diagnosis not present

## 2022-02-18 DIAGNOSIS — M25562 Pain in left knee: Secondary | ICD-10-CM

## 2022-02-18 MED ORDER — METHYLPREDNISOLONE ACETATE 40 MG/ML IJ SUSP
40.0000 mg | INTRAMUSCULAR | Status: AC | PRN
  Administered 2022-02-18: 40 mg via INTRA_ARTICULAR

## 2022-02-18 MED ORDER — LIDOCAINE HCL 1 % IJ SOLN
3.0000 mL | INTRAMUSCULAR | Status: AC | PRN
  Administered 2022-02-18: 3 mL

## 2022-02-18 NOTE — Progress Notes (Signed)
Office Visit Note   Patient: Beverly [name]           Date of Birth: 11-11-1945           MRN: [id number] Visit Date: 02/18/2022              Requested by: Antony Contras, MD [address],  [place] [address] PCP: Antony Contras, MD   Assessment & Plan: Visit Diagnoses:  1. Chronic pain of left knee   2. Chronic pain of right knee   3. Chondromalacia patellae, left knee   4. Chondromalacia patellae, right knee     Plan: At this point I recommended a steroid injection for both knees since she has not had this before.  We will then see her back in follow-up in 4 weeks to see what response she has had.  Another option down the road would be hyaluronic acid.  Follow-Up Instructions: Return in about 4 weeks (around 03/18/2022).   Orders:  Orders Placed This Encounter  Procedures   Large Joint Inj   Large Joint Inj   XR Knee 1-2 Views Right   XR Knee 1-2 Views Left   No orders of the defined types were placed in this encounter.     Procedures: Large Joint Inj: R knee on 02/18/2022 10:17 AM Indications: diagnostic evaluation and pain Details: 22 G 1.5 in needle, superolateral approach  Arthrogram: No  Medications: 3 mL lidocaine 1 %; 40 mg methylPREDNISolone acetate 40 MG/ML Outcome: tolerated well, no immediate complications Procedure, treatment alternatives, risks and benefits explained, specific risks discussed. Consent was given by the patient. Immediately prior to procedure a time out was called to verify the correct patient, procedure, equipment, support staff and site/side marked as required. Patient was prepped and draped in the usual sterile fashion.    Large Joint Inj: L knee on 02/18/2022 10:17 AM Indications: diagnostic evaluation and pain Details: 22 G 1.5 in needle, superolateral approach  Arthrogram: No  Medications: 3 mL lidocaine 1 %; 40 mg methylPREDNISolone acetate 40 MG/ML Outcome: tolerated well, no immediate  complications Procedure, treatment alternatives, risks and benefits explained, specific risks discussed. Consent was given by the patient. Immediately prior to procedure a time out was called to verify the correct patient, procedure, equipment, support staff and site/side marked as required. Patient was prepped and draped in the usual sterile fashion.       Clinical Data: No additional findings.   Subjective: Chief Complaint  Patient presents with   Left Knee - Pain   Right Knee - Pain  The patient is an incredibly active and thin 76 year old female who is seen before.  She has had bilateral knee pain for many years now with the right worse than left.  It is really mainly problematic going up and down hills and with hiking.  She denies any locking catching.  She has tried knee sleeves.  She has been through outpatient physical therapy to strengthen her knees.  She has not had any type of injections.  She is otherwise very healthy and active.  HPI  Review of Systems There is no listed fever, chills, nausea, vomiting  Objective: Vital Signs: There were no vitals taken for this visit.  Physical Exam She is alert and oriented x3 and in no acute distress Ortho Exam Examination of both knees shows full and fluid range of motion.  Both knees are ligamentously stable.  Both knees have significant patellofemoral chondromalacia. Specialty Comments:  No specialty  comments available.  Imaging: XR Knee 1-2 Views Right  Result Date: 02/18/2022 2 views of the right knee show well-maintained medial lateral compartments.  The alignment is neutral.  There is significant patellofemoral arthritis.  XR Knee 1-2 Views Left  Result Date: 02/18/2022 2 views of the left knee show well-maintained medial lateral compartments.  The alignment is neutral.  There is significant patellofemoral arthritic changes.    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   Primary osteoarthritis of left hip 12/07/2018   Primary osteoarthritis of right hip 12/07/2018   Pain of right hip joint 05/21/2017   Trochanteric bursitis, right hip 04/23/2017   Pain in right hip 04/23/2017   Hearing loss of right ear due to cerumen impaction 10/25/2016   Chronic rhinitis 07/04/2015   Cough 07/04/2015   Endometrial polyp 01/11/2011   Past Medical History:  Diagnosis Date   Atypical mole 11/03/2013   R prox post lat thigh    Basal cell carcinoma    mid back   Basal cell carcinoma 04/01/2019   L chest parasternal    Basal cell carcinoma 09/11/2017   L mid back at braline 3.0 cm lat to spine   Emphysema lung (HCC)    Mild no symptoms Found on coronary CT scan   Esophageal dysmotility    GERD (gastroesophageal reflux disease)    occassional   OA (osteoarthritis)    hips and knees   Pneumonia    history of    PONV (postoperative nausea and vomiting)    Squamous cell carcinoma of skin 06/11/2021   L mid pretibia, EDC    Family History  Problem Relation Age of Onset   Allergic rhinitis Neg Hx    Angioedema Neg Hx    Asthma Neg Hx    Atopy Neg Hx    Eczema Neg Hx    Immunodeficiency Neg Hx    Urticaria Neg Hx     Past Surgical History:  Procedure Laterality Date   COLONOSCOPY     FRACTURE SURGERY  2009   left ankle   INGUINAL HERNIA REPAIR Right 08/22/2021   Procedure: OPEN RIGHT INGUINAL HERNIA REPAIR WITH MESH;  Surgeon: Clovis Riley, MD;  Location: [facility];  Service: General;  Laterality: Right;   LAPAROSCOPY  1992   infection after tubal   SHOULDER OPEN ROTATOR CUFF REPAIR Right 2009   TOTAL HIP ARTHROPLASTY Left 01/29/2019   Procedure: LEFT TOTAL HIP ARTHROPLASTY ANTERIOR APPROACH;  Surgeon: Mcarthur Rossetti, MD;  Location: [facility];  Service: Orthopedics;  Laterality: Left;   TOTAL HIP ARTHROPLASTY Right  09/24/2019   Procedure: RIGHT TOTAL HIP ARTHROPLASTY ANTERIOR APPROACH;  Surgeon: Mcarthur Rossetti, MD;  Location: [facility];  Service: Orthopedics;  Laterality: Right;   TUBAL LIGATION  1992   Social History   Occupational History   Not on file  Tobacco Use   Smoking status: Former    Types: Cigarettes    Quit date: 12/27/1979    Years since quitting: 42.1   Smokeless tobacco: Never  Vaping Use   Vaping Use: Never used  Substance and Sexual Activity   Alcohol use: Yes    Alcohol/week: 3.0 standard drinks of alcohol    Types: 3 Glasses of wine per week    Comment: 1-2 a week  Drug use: No   Sexual activity: Not Currently    Birth control/protection: Post-menopausal

## 2022-03-18 ENCOUNTER — Ambulatory Visit (INDEPENDENT_AMBULATORY_CARE_PROVIDER_SITE_OTHER): Payer: Medicare Other | Admitting: Orthopaedic Surgery

## 2022-03-18 ENCOUNTER — Encounter: Payer: Self-pay | Admitting: Orthopaedic Surgery

## 2022-03-18 ENCOUNTER — Telehealth: Payer: Self-pay

## 2022-03-18 DIAGNOSIS — M25561 Pain in right knee: Secondary | ICD-10-CM | POA: Diagnosis not present

## 2022-03-18 DIAGNOSIS — M2242 Chondromalacia patellae, left knee: Secondary | ICD-10-CM | POA: Diagnosis not present

## 2022-03-18 DIAGNOSIS — M25562 Pain in left knee: Secondary | ICD-10-CM | POA: Diagnosis not present

## 2022-03-18 DIAGNOSIS — G8929 Other chronic pain: Secondary | ICD-10-CM | POA: Diagnosis not present

## 2022-03-18 DIAGNOSIS — M2241 Chondromalacia patellae, right knee: Secondary | ICD-10-CM | POA: Diagnosis not present

## 2022-03-18 DIAGNOSIS — Z23 Encounter for immunization: Secondary | ICD-10-CM | POA: Diagnosis not present

## 2022-03-18 NOTE — Progress Notes (Signed)
The patient is well-known to me.  She has well-documented patellofemoral chondromalacia that affects both knees.  At her last visit we did place a steroid injection in both knees which she said were wonderful for about a week or so but then she still gets back to having her same pain from patellofemoral arthritis.  She is a petite individual.  She is an active 76 year old female who does hike and walks quite a bit.  Both knees have excellent alignment but patellofemoral crepitation of both knees.  Previous x-rays show end-stage arthritis of only the patellofemoral joint of both knees with well-maintained medial lateral compartments.  Of note both knees are ligamentously stable.  The next step to consider is hyaluronic acid in both knees and she agrees with this as well.  We will see if we get this ordered and approved for her knees and be in touch with her.

## 2022-03-18 NOTE — Telephone Encounter (Signed)
Bilateral knee gel injections 

## 2022-03-19 NOTE — Telephone Encounter (Signed)
VOB submitted for Monovisc, bilateral knee  

## 2022-03-28 DIAGNOSIS — Z20822 Contact with and (suspected) exposure to covid-19: Secondary | ICD-10-CM | POA: Diagnosis not present

## 2022-03-28 DIAGNOSIS — R051 Acute cough: Secondary | ICD-10-CM | POA: Diagnosis not present

## 2022-04-03 DIAGNOSIS — N390 Urinary tract infection, site not specified: Secondary | ICD-10-CM | POA: Diagnosis not present

## 2022-04-03 DIAGNOSIS — R051 Acute cough: Secondary | ICD-10-CM | POA: Diagnosis not present

## 2022-04-03 DIAGNOSIS — R300 Dysuria: Secondary | ICD-10-CM | POA: Diagnosis not present

## 2022-05-02 ENCOUNTER — Ambulatory Visit (INDEPENDENT_AMBULATORY_CARE_PROVIDER_SITE_OTHER): Payer: Medicare Other | Admitting: Emergency Medicine

## 2022-05-02 ENCOUNTER — Encounter: Payer: Self-pay | Admitting: Emergency Medicine

## 2022-05-02 VITALS — BP 126/72 | HR 82 | Temp 98.2°F | Ht 67.0 in | Wt 133.8 lb

## 2022-05-02 DIAGNOSIS — R053 Chronic cough: Secondary | ICD-10-CM

## 2022-05-02 DIAGNOSIS — R9389 Abnormal findings on diagnostic imaging of other specified body structures: Secondary | ICD-10-CM

## 2022-05-02 MED ORDER — BENZONATATE 200 MG PO CAPS: 200.0000 mg | ORAL_CAPSULE | Freq: Three times a day (TID) | ORAL | 1 refills | Status: DC | PRN

## 2022-05-02 MED ORDER — AZELASTINE HCL 0.1 % NA SOLN
2.0000 | NASAL | 12 refills | Status: DC | PRN
Start: 2022-05-02 — End: 2023-06-11

## 2022-05-02 NOTE — Assessment & Plan Note (Signed)
Cough that began about 6 weeks ago when she was in [place].  In retrospect she has had episodes like this when she visiting family in [place] before, question ragweed exposure.  This could be upper airway irritation being sustained by her known chronic rhinitis, subclinical GERD.  Must also consider the fact that she has had bronchiectatic change, mucoid impaction on chest imaging, improved in July but possibly more active now.  I discussed with her obtaining pulmonary function testing, repeat CT chest.  We may decide to do this in the future but for now we will try to treat the, sustaining factors for chronic cough.  I will increase her GERD therapy and rhinitis therapy temporarily.  Try to suppress cough with Tessalon.  If she continues to cough then I believe she will need PFT, repeat CT chest and possibly bronchoscopy for culture data.

## 2022-05-02 NOTE — Assessment & Plan Note (Signed)
Bronchiectatic change, mucoid impaction improved compared with her original coronary calcium CT.  She did have some residual groundglass.  Question whether this may reflect MAIC.  No evidence or history of autoimmune disease.  Will likely need a repeat CT.  We will determine timing based on improvement in her current cough.

## 2022-05-02 NOTE — Patient Instructions (Signed)
Please temporarily increase your Protonix (pantoprazole) to 40 mg twice a day.  Take this medication 1 hour around food. Continue your antihistamine daily as you have been taking it at least until next visit. Try using Astelin nasal spray, 2 sprays each nostril as needed for nasal congestion and drainage Try using Tessalon Perles 200 mg up to every 6 hours as needed for cough suppression Depending on how your cough is doing at your next visit we will consider repeat CT scan of the chest to compare with July We will arrange for pulmonary function testing at some point going forward. Follow Dr. Lamonte Sakai in 6 weeks or next available

## 2022-05-02 NOTE — Progress Notes (Signed)
Subjective:    Patient ID: Beverly [name], female    DOB: [birth year], 76 y.o.   MRN: [id number]  HPI 76 year old former smoker (4 pack years) with a history of GERD and esophageal dysmotility, osteoarthritis, basal cell skin cancers.  She is referred today for evaluation of suspected emphysema and some small pulmonary nodules that were noted on cardiac CT scan of the chest.  No dyspnea, no limitations. Great functional capacity. She does have an occasional dry cough that she has ascribed to reflux, got better w PPI.  No sputum production. She has a lot rhinitis, probably non-allergic (allergy testing negative). She has had bronchitis before, PNA 5 yrs ago.   Cardiac CT 11/16/2020 reviewed by me, shows some bronchial wall thickening and mild emphysematous change, some regional cylindrical bronchiectasis and micro nodularity, question atypical infection or chronic mucoid impaction.  ROV 05/02/22 --Beverly [name] is 76 with a formal mild tobacco history, esophageal dysmotility/GERD.  I have seen her in the past for an abnormal CT scan of the chest that showed some mild emphysematous change as well as micronodular disease and some regional cylindrical bronchiectasis, mucoid impaction, question mycobacterial disease. She returns today reporting cough. About 6 weeks ago she was in [place], began to have increased clear mucous and cough, was bringing up clear sputum. She was treated with abx and pred. The cough has continued, a bit less and a bit less mucous. She is on protonix and only minimal breakthrough GERD. She coughs with deep breaths. She may have some associated exertional SOB - likes to walk, hike. Her COVID in [place] was negative.  CT chest 12/13/2021 reviewed by me, showed interval improvement in parabronchial nodularity mucoid impaction in the lingula and left lower lobe, minimal parabronchial groundglass in the right lower lobe    Review of Systems As per HPI  Past Medical History:  Diagnosis Date    Atypical mole 11/03/2013   R prox post lat thigh    Basal cell carcinoma    mid back   Basal cell carcinoma 04/01/2019   L chest parasternal    Basal cell carcinoma 09/11/2017   L mid back at braline 3.0 cm lat to spine   Emphysema lung (HCC)    Mild no symptoms Found on coronary CT scan   Esophageal dysmotility    GERD (gastroesophageal reflux disease)    occassional   OA (osteoarthritis)    hips and knees   Pneumonia    history of    PONV (postoperative nausea and vomiting)    Squamous cell carcinoma of skin 06/11/2021   L mid pretibia, EDC     Family History  Problem Relation Age of Onset   Allergic rhinitis Neg Hx    Angioedema Neg Hx    Asthma Neg Hx    Atopy Neg Hx    Eczema Neg Hx    Immunodeficiency Neg Hx    Urticaria Neg Hx     No hx COPD, Asthma, lung CA  Social History   Socioeconomic History   Marital status: Married    Spouse name: Not on file   Number of children: Not on file   Years of education: Not on file   Highest education level: Not on file  Occupational History   Not on file  Tobacco Use   Smoking status: Former    Types: Cigarettes    Quit date: 12/27/1979    Years since quitting: 42.3   Smokeless tobacco: Never  Vaping Use   Vaping Use:  Never used  Substance and Sexual Activity   Alcohol use: Yes    Alcohol/week: 3.0 standard drinks of alcohol    Types: 3 Glasses of wine per week    Comment: 1-2 a week   Drug use: No   Sexual activity: Not Currently    Birth control/protection: Post-menopausal  Other Topics Concern   Not on file  Social History Narrative   Not on file   Social Determinants of Health   Financial Resource Strain: Not on file  Food Insecurity: Not on file  Transportation Needs: Not on file  Physical Activity: Not on file  Stress: Not on file  Social Connections: Not on file  Intimate Partner Violence: Not on file    She worked in Museum/gallery exhibitions officer Has lived in [place], [place], [place], [place], [place], [place]   Allergies   Allergen Reactions   Macrodantin Hives   Sulfa Antibiotics Palpitations     Outpatient Medications Prior to Visit  Medication Sig Dispense Refill   bimatoprost (LATISSE) 0.03 % ophthalmic solution PLACE ONE DROP ON APPLICATOR AND APPLY EVENLY ALONG THE SKIN OF THE UPPER EYELID AT BASE OF EYELASHES ONCE DAILY AT BEDTIME TO BOTH EYES (Patient taking differently: Place 1 application. into both eyes 2 (two) times a week. At bedtime) 5 mL 4   Calcium Carb-Cholecalciferol (CALCIUM 600 + D PO) Take 1 tablet by mouth daily.     estradiol (VIVELLE-DOT) 0.025 MG/24HR Place 1 patch onto the skin 2 (two) times a week. ALORA  (Wednesdays & Saturdays)     Multiple Vitamins-Minerals (MULTIVITAMIN WITH MINERALS) tablet Take 1 tablet by mouth daily.     Oil of Oregano 1500 MG CAPS Take 1,500 mg by mouth daily.     OVER THE COUNTER MEDICATION Take 7-8 capsules by mouth at bedtime. [place] RHUBARB 400 mg/capsule     pantoprazole (PROTONIX) 40 MG tablet TAKE 1 TABLET BY MOUTH DAILY.  'OV NEEDED FOR ADDITIONAL REFILLS" (Patient taking differently: Take 40 mg by mouth every evening. TAKE 1 TABLET BY MOUTH DAILY.  'OV NEEDED FOR ADDITIONAL REFILLS") 30 tablet 0   pravastatin (PRAVACHOL) 10 MG tablet Take 10 mg by mouth once a week.     Probiotic Product (PROBIOTIC PO) Take 1 capsule by mouth daily. 5 Billion CFUs     progesterone (PROMETRIUM) 100 MG capsule Take 100 mg by mouth every Monday, Wednesday, and Friday.      vitamin B-12 (CYANOCOBALAMIN) 1000 MCG tablet Take 1,000 mcg by mouth daily.     ondansetron (ZOFRAN) 4 MG tablet Take 1 tablet (4 mg total) by mouth every 8 (eight) hours as needed for nausea or vomiting. 20 tablet 0   No facility-administered medications prior to visit.          Objective:   Physical Exam Vitals:   05/02/22 0908  BP: 126/72  Pulse: 82  Temp: 98.2 F (36.8 C)  TempSrc: Oral  SpO2: 98%  Weight: 133 lb 12.8 oz (60.7 kg)  Height: '5\' 7"'$  (1.702 m)   Gen: Pleasant,  well-nourished, in no distress,  normal affect  ENT: No lesions,  mouth clear,  oropharynx clear, no postnasal drip  Neck: No JVD, no stridor  Lungs: No use of accessory muscles, no crackles or wheezing on normal respiration, no wheeze on forced expiration  Cardiovascular: RRR, heart sounds normal, no murmur or gallops, no peripheral edema  Musculoskeletal: No deformities, no cyanosis or clubbing  Neuro: alert, awake, non focal  Skin: Warm, no lesions or rash  Assessment & Plan:  Cough Cough that began about 6 weeks ago when she was in [place].  In retrospect she has had episodes like this when she visiting family in [place] before, question ragweed exposure.  This could be upper airway irritation being sustained by her known chronic rhinitis, subclinical GERD.  Must also consider the fact that she has had bronchiectatic change, mucoid impaction on chest imaging, improved in July but possibly more active now.  I discussed with her obtaining pulmonary function testing, repeat CT chest.  We may decide to do this in the future but for now we will try to treat the, sustaining factors for chronic cough.  I will increase her GERD therapy and rhinitis therapy temporarily.  Try to suppress cough with Tessalon.  If she continues to cough then I believe she will need PFT, repeat CT chest and possibly bronchoscopy for culture data.  Abnormal CT of the chest Bronchiectatic change, mucoid impaction improved compared with her original coronary calcium CT.  She did have some residual groundglass.  Question whether this may reflect MAIC.  No evidence or history of autoimmune disease.  Will likely need a repeat CT.  We will determine timing based on improvement in her current cough.   Baltazar Apo, MD, PhD 05/02/2022, 10:46 AM [facility] [phone] or if no answer before 7:00PM call [phone] For any issues after 7:00PM please call eLink [phone]

## 2022-05-03 MED ORDER — PANTOPRAZOLE SODIUM 40 MG PO TBEC: 40.0000 mg | DELAYED_RELEASE_TABLET | Freq: Two times a day (BID) | ORAL | 1 refills | Status: DC

## 2022-05-13 ENCOUNTER — Encounter: Payer: Self-pay | Admitting: Emergency Medicine

## 2022-05-13 DIAGNOSIS — R9389 Abnormal findings on diagnostic imaging of other specified body structures: Secondary | ICD-10-CM

## 2022-05-13 DIAGNOSIS — R053 Chronic cough: Secondary | ICD-10-CM

## 2022-05-13 MED ORDER — PREDNISONE 20 MG PO TABS: 20.0000 mg | ORAL_TABLET | Freq: Every day | ORAL | 0 refills | Status: DC

## 2022-05-13 NOTE — Telephone Encounter (Signed)
Now that she has increased those meds, it might be reasonable to treat her again with a short course prednisone to decrease existing inflammation. If she is willing to try this then give her '20mg'$  qd x 5 days. Have her continue the other meds as she is doing  Also, based on the continued cough, I think we should go ahead and repeat her CT chest without contrast to follow her bronchiectasis. If she agrees, then please order the CT.

## 2022-05-13 NOTE — Telephone Encounter (Signed)
Mychart message sent by pt: Lowella Fairy [facility] (supporting Collene Gobble, MD)33 minutes ago (2:32 PM)    Hi Raquel Sarna, I have been taking increased pantoprazole for the reflux, using the antihistamine twice daily, and using cough med for about a week and a half.  Still doing considerable coughing.  Just wondering when I should see improvement from these changes.   This is now 8th week of coughing.     Thank you, [name]     Dr. Lamonte Sakai, please advise.

## 2022-05-15 DIAGNOSIS — H2513 Age-related nuclear cataract, bilateral: Secondary | ICD-10-CM | POA: Diagnosis not present

## 2022-05-22 ENCOUNTER — Ambulatory Visit (HOSPITAL_BASED_OUTPATIENT_CLINIC_OR_DEPARTMENT_OTHER)
Admission: RE | Admit: 2022-05-22 | Discharge: 2022-05-22 | Disposition: A | Discharge status: Home / Self Care | Payer: Medicare Other | Source: Ambulatory Visit | Attending: Family Medicine | Admitting: Family Medicine

## 2022-05-22 DIAGNOSIS — J479 Bronchiectasis, uncomplicated: Secondary | ICD-10-CM | POA: Diagnosis not present

## 2022-05-22 DIAGNOSIS — J439 Emphysema, unspecified: Secondary | ICD-10-CM | POA: Diagnosis not present

## 2022-05-22 DIAGNOSIS — R9389 Abnormal findings on diagnostic imaging of other specified body structures: Secondary | ICD-10-CM | POA: Diagnosis not present

## 2022-05-22 DIAGNOSIS — R053 Chronic cough: Secondary | ICD-10-CM | POA: Diagnosis not present

## 2022-05-25 ENCOUNTER — Other Ambulatory Visit: Payer: Self-pay | Admitting: Emergency Medicine

## 2022-05-28 ENCOUNTER — Encounter: Payer: Self-pay | Admitting: Emergency Medicine

## 2022-05-30 ENCOUNTER — Encounter: Payer: Self-pay | Admitting: Emergency Medicine

## 2022-05-30 NOTE — Telephone Encounter (Signed)
New messages sent by pt: Beverly [name] [facility] (supporting Collene Gobble, MD)3 minutes ago (4:20 PM)    I do cough flem, but I have figured that is because of the cough congestion over the counter meds.  No color or blood.  No fever, chills.  No improvement with either Prednisone doses.    [name]  P [facility] (supporting Collene Gobble, MD)7 minutes ago (4:15 PM)    Thank you, Raquel Sarna, but I am not sure this makes me feel better.  If someone is covering for him and still hasn't read that scan,  doesn't give me a lot more confidence in the practice.  I really don't feel like I have a group that cares about their patients.  I realize I am not dying, but coughing is causing multiple issues with my body.  I fear sometimes my hernia repair is going to break.  I have been putting a great deal of pressure on head, abdomen and bladder.  My lungs have to be petty miserable with all this too.      Routing back to Gregory.

## 2022-05-30 NOTE — Telephone Encounter (Signed)
I understand her frustration and I apologize. I am sure that is very difficult/uncomfortable. There is someone covering; however, it is standard to provide a week for review if a test has not been ordered/reviewed as STAT. We do care about her and our patients, and want to help her to the best of our ability.   Can you have her come in to collect sputum culture cups? She will need two - sputum culture and AFB. I think we should move forward with treating her for possible acute infection and bronchiectatic exacerbation with abx course if she is having infectious symptoms with these CT findings. Please send augmentin 875 mg 1 pill Twice daily for 7 days. Take with food. Take daily probiotic while on this. We can also send in a cough medications for her to take as needed to calm the cough down - promethazine DM 5 mL every 6 hours as needed for cough. May cause drowsiness. Do not drive after taking.  Benzonatate 200 mg 1 capsule Three times a day for cough. This will not cause drowsiness so she can use throughout the day.  No refills on the above medications. Please ensure she has follow up ASAP with Dr. Lamonte Sakai. Thanks.

## 2022-05-30 NOTE — Telephone Encounter (Signed)
Mychart message sent by pt: Beverly [name] [facility] (supporting Collene Gobble, MD)19 minutes ago (3:43 PM)    It has now been a week since the CT scan was in my chart.  I have been coughing for two and a half months.  I do not understand why I am not hearing anything from Dr Lamonte Sakai.  If I need to find a new pulmonologist,  I can do that.  I am feeling very ignored and really tired of coughing everyday.  If I have to live with this, I need to know that.  No response is unacceptable.  Please let me hear something by tomorrow.    Pt had also sent message on 12/26 which is able to be seen in pt's chart as well.  Due to pt's frustration with everything, routing to [place] as high priority. Katie, please advise on all of this (current message and one from 12/26) for pt.

## 2022-05-30 NOTE — Telephone Encounter (Signed)
Please apologize for the delay. It looks like the CT scan results did not come back until Friday, 12/22 around 10 pm. The office was then closed for the weekend and the holiday Monday and Dr. Lamonte Sakai will be out of the office for the remainder of the year.  I reviewed her scan. Appears there has been worsening since her last CT of her chest. Possible there is a chronic infectious component to this and she may need bronchoscopy for further evaluation, which can be discussed further with Dr. Lamonte Sakai. In the interim, I would just like to clarify a few things before making further treatment decisions. Did her cough improve with previous prednisone course? Also, is she producing any phlegm with the cough or is it dry? Any fevers, chills, hemoptysis? Please route back to me so we can address her concerns today. Thanks.

## 2022-05-31 MED ORDER — BENZONATATE 200 MG PO CAPS: 200.0000 mg | ORAL_CAPSULE | Freq: Three times a day (TID) | ORAL | 1 refills | Status: DC | PRN

## 2022-05-31 MED ORDER — AMOXICILLIN-POT CLAVULANATE 875-125 MG PO TABS: 1.0000 | ORAL_TABLET | Freq: Two times a day (BID) | ORAL | 0 refills | Status: DC

## 2022-05-31 MED ORDER — PROMETHAZINE-DM 6.25-15 MG/5ML PO SYRP: 5.0000 mL | ORAL_SOLUTION | Freq: Four times a day (QID) | ORAL | 0 refills | Status: DC | PRN

## 2022-05-31 NOTE — Telephone Encounter (Signed)
Please let the patient know that her CT chest shows persistent inflammatory changes consistent with bronchitis. We will need to review further to decide next steps. If she needs to be seen sooner then please set her up for an acute visit w APP>

## 2022-06-10 ENCOUNTER — Encounter: Payer: Self-pay | Admitting: Emergency Medicine

## 2022-06-18 ENCOUNTER — Ambulatory Visit: Payer: Medicare Other | Admitting: Emergency Medicine

## 2022-06-19 DIAGNOSIS — J479 Bronchiectasis, uncomplicated: Secondary | ICD-10-CM | POA: Diagnosis not present

## 2022-06-19 DIAGNOSIS — J432 Centrilobular emphysema: Secondary | ICD-10-CM | POA: Diagnosis not present

## 2022-06-19 DIAGNOSIS — Z87891 Personal history of nicotine dependence: Secondary | ICD-10-CM | POA: Diagnosis not present

## 2022-06-26 DIAGNOSIS — R300 Dysuria: Secondary | ICD-10-CM | POA: Diagnosis not present

## 2022-06-26 DIAGNOSIS — N3001 Acute cystitis with hematuria: Secondary | ICD-10-CM | POA: Diagnosis not present

## 2022-07-04 DIAGNOSIS — H6123 Impacted cerumen, bilateral: Secondary | ICD-10-CM | POA: Diagnosis not present

## 2022-07-08 DIAGNOSIS — D120 Benign neoplasm of cecum: Secondary | ICD-10-CM | POA: Diagnosis not present

## 2022-07-08 DIAGNOSIS — K5730 Diverticulosis of large intestine without perforation or abscess without bleeding: Secondary | ICD-10-CM | POA: Diagnosis not present

## 2022-07-08 DIAGNOSIS — K644 Residual hemorrhoidal skin tags: Secondary | ICD-10-CM | POA: Diagnosis not present

## 2022-07-08 DIAGNOSIS — D123 Benign neoplasm of transverse colon: Secondary | ICD-10-CM | POA: Diagnosis not present

## 2022-07-08 DIAGNOSIS — K648 Other hemorrhoids: Secondary | ICD-10-CM | POA: Diagnosis not present

## 2022-07-08 DIAGNOSIS — K6389 Other specified diseases of intestine: Secondary | ICD-10-CM | POA: Diagnosis not present

## 2022-07-08 DIAGNOSIS — Z09 Encounter for follow-up examination after completed treatment for conditions other than malignant neoplasm: Secondary | ICD-10-CM | POA: Diagnosis not present

## 2022-07-08 DIAGNOSIS — Z86010 Personal history of colonic polyps: Secondary | ICD-10-CM | POA: Diagnosis not present

## 2022-07-10 DIAGNOSIS — D123 Benign neoplasm of transverse colon: Secondary | ICD-10-CM | POA: Diagnosis not present

## 2022-07-22 ENCOUNTER — Other Ambulatory Visit: Payer: Self-pay | Admitting: Dermatology

## 2022-07-25 ENCOUNTER — Telehealth: Payer: Self-pay | Admitting: Orthopaedic Surgery

## 2022-07-25 NOTE — Telephone Encounter (Signed)
Patient called in about Bil knee injections they were submitted in October but no update since she is ready to schedule

## 2022-07-26 NOTE — Telephone Encounter (Signed)
Talked with patient concerning gel injection.  Appt.has been scheduled

## 2022-07-30 ENCOUNTER — Other Ambulatory Visit: Payer: Self-pay

## 2022-07-30 DIAGNOSIS — G8929 Other chronic pain: Secondary | ICD-10-CM

## 2022-07-31 DIAGNOSIS — Z01419 Encounter for gynecological examination (general) (routine) without abnormal findings: Secondary | ICD-10-CM | POA: Diagnosis not present

## 2022-07-31 DIAGNOSIS — Z79890 Hormone replacement therapy (postmenopausal): Secondary | ICD-10-CM | POA: Diagnosis not present

## 2022-07-31 DIAGNOSIS — Z124 Encounter for screening for malignant neoplasm of cervix: Secondary | ICD-10-CM | POA: Diagnosis not present

## 2022-07-31 DIAGNOSIS — E7800 Pure hypercholesterolemia, unspecified: Secondary | ICD-10-CM | POA: Diagnosis not present

## 2022-07-31 DIAGNOSIS — Z01411 Encounter for gynecological examination (general) (routine) with abnormal findings: Secondary | ICD-10-CM | POA: Diagnosis not present

## 2022-07-31 DIAGNOSIS — Z6821 Body mass index (BMI) 21.0-21.9, adult: Secondary | ICD-10-CM | POA: Diagnosis not present

## 2022-08-05 ENCOUNTER — Ambulatory Visit: Payer: Medicare Other | Admitting: Dermatology

## 2022-08-12 DIAGNOSIS — J439 Emphysema, unspecified: Secondary | ICD-10-CM | POA: Diagnosis not present

## 2022-08-12 DIAGNOSIS — U071 COVID-19: Secondary | ICD-10-CM | POA: Diagnosis not present

## 2022-08-14 ENCOUNTER — Ambulatory Visit (INDEPENDENT_AMBULATORY_CARE_PROVIDER_SITE_OTHER): Payer: Medicare Other | Admitting: Dermatology

## 2022-08-14 VITALS — BP 141/80

## 2022-08-14 DIAGNOSIS — L570 Actinic keratosis: Secondary | ICD-10-CM | POA: Diagnosis not present

## 2022-08-14 DIAGNOSIS — L578 Other skin changes due to chronic exposure to nonionizing radiation: Secondary | ICD-10-CM | POA: Diagnosis not present

## 2022-08-14 DIAGNOSIS — L814 Other melanin hyperpigmentation: Secondary | ICD-10-CM | POA: Diagnosis not present

## 2022-08-14 DIAGNOSIS — Z85828 Personal history of other malignant neoplasm of skin: Secondary | ICD-10-CM | POA: Diagnosis not present

## 2022-08-14 DIAGNOSIS — L821 Other seborrheic keratosis: Secondary | ICD-10-CM

## 2022-08-14 DIAGNOSIS — D229 Melanocytic nevi, unspecified: Secondary | ICD-10-CM

## 2022-08-14 DIAGNOSIS — Z79899 Other long term (current) drug therapy: Secondary | ICD-10-CM

## 2022-08-14 DIAGNOSIS — Z8589 Personal history of malignant neoplasm of other organs and systems: Secondary | ICD-10-CM

## 2022-08-14 DIAGNOSIS — Z1283 Encounter for screening for malignant neoplasm of skin: Secondary | ICD-10-CM

## 2022-08-14 DIAGNOSIS — R21 Rash and other nonspecific skin eruption: Secondary | ICD-10-CM | POA: Diagnosis not present

## 2022-08-14 DIAGNOSIS — L249 Irritant contact dermatitis, unspecified cause: Secondary | ICD-10-CM | POA: Diagnosis not present

## 2022-08-14 DIAGNOSIS — L820 Inflamed seborrheic keratosis: Secondary | ICD-10-CM | POA: Diagnosis not present

## 2022-08-14 MED ORDER — MOMETASONE FUROATE 0.1 % EX CREA: 1.0000 | TOPICAL_CREAM | Freq: Every day | CUTANEOUS | 0 refills | Status: DC | PRN

## 2022-08-14 NOTE — Patient Instructions (Signed)
Cryotherapy Aftercare  Wash gently with soap and water everyday.   Apply Vaseline and Band-Aid daily until healed.     Due to recent changes in healthcare laws, you may see results of your pathology and/or laboratory studies on MyChart before the doctors have had a chance to review them. We understand that in some cases there may be results that are confusing or concerning to you. Please understand that not all results are received at the same time and often the doctors may need to interpret multiple results in order to provide you with the best plan of care or course of treatment. Therefore, we ask that you please give us 2 business days to thoroughly review all your results before contacting the office for clarification. Should we see a critical lab result, you will be contacted sooner.   If You Need Anything After Your Visit  If you have any questions or concerns for your doctor, please call our main line at [phone] and press option 4 to reach your doctor's medical assistant. If no one answers, please leave a voicemail as directed and we will return your call as soon as possible. Messages left after 4 pm will be answered the following business day.   You may also send us a message via MyChart. We typically respond to MyChart messages within 1-2 business days.  For prescription refills, please ask your pharmacy to contact our office. Our fax number is [phone].  If you have an urgent issue when the clinic is closed that cannot wait until the next business day, you can page your doctor at the number below.    Please note that while we do our best to be available for urgent issues outside of office hours, we are not available 24/7.   If you have an urgent issue and are unable to reach us, you may choose to seek medical care at your doctor's office, retail clinic, urgent care center, or emergency room.  If you have a medical emergency, please immediately call 911 or go to the  emergency department.  Pager Numbers  - Dr. Kowalski: [phone]  - Dr. Moye: [phone]  - Dr. Stewart: [phone]  In the event of inclement weather, please call our main line at [phone] for an update on the status of any delays or closures.  Dermatology Medication Tips: Please keep the boxes that topical medications come in in order to help keep track of the instructions about where and how to use these. Pharmacies typically print the medication instructions only on the boxes and not directly on the medication tubes.   If your medication is too expensive, please contact our office at [phone] option 4 or send us a message through MyChart.   We are unable to tell what your co-pay for medications will be in advance as this is different depending on your insurance coverage. However, we may be able to find a substitute medication at lower cost or fill out paperwork to get insurance to cover a needed medication.   If a prior authorization is required to get your medication covered by your insurance company, please allow us 1-2 business days to complete this process.  Drug prices often vary depending on where the prescription is filled and some pharmacies may offer cheaper prices.  The website www.goodrx.com contains coupons for medications through different pharmacies. The prices here do not account for what the cost may be with help from insurance (it may be cheaper with your insurance), but the website can   give you the price if you did not use any insurance.  - You can print the associated coupon and take it with your prescription to the pharmacy.  - You may also stop by our office during regular business hours and pick up a GoodRx coupon card.  - If you need your prescription sent electronically to a different pharmacy, notify our office through  MyChart or by phone at [phone] option 4.     Si Usted Necesita Algo Despus de Su Visita  Tambin puede  enviarnos un mensaje a travs de MyChart. Por lo general respondemos a los mensajes de MyChart en el transcurso de 1 a 2 das hbiles.  Para renovar recetas, por favor pida a su farmacia que se ponga en contacto con nuestra oficina. Nuestro nmero de fax es el [phone].  Si tiene un asunto urgente cuando la clnica est cerrada y que no puede esperar hasta el siguiente da hbil, puede llamar/localizar a su doctor(a) al nmero que aparece a continuacin.   Por favor, tenga en cuenta que aunque hacemos todo lo posible para estar disponibles para asuntos urgentes fuera del horario de oficina, no estamos disponibles las 24 horas del da, los 7 das de la semana.   Si tiene un problema urgente y no puede comunicarse con nosotros, puede optar por buscar atencin mdica  en el consultorio de su doctor(a), en una clnica privada, en un centro de atencin urgente o en una sala de emergencias.  Si tiene una emergencia mdica, por favor llame inmediatamente al 911 o vaya a la sala de emergencias.  Nmeros de bper  - Dr. Kowalski: [phone]  - Dra. Moye: [phone]  - Dra. Stewart: [phone]  En caso de inclemencias del tiempo, por favor llame a nuestra lnea principal al [phone] para una actualizacin sobre el estado de cualquier retraso o cierre.  Consejos para la medicacin en dermatologa: Por favor, guarde las cajas en las que vienen los medicamentos de uso tpico para ayudarle a seguir las instrucciones sobre dnde y cmo usarlos. Las farmacias generalmente imprimen las instrucciones del medicamento slo en las cajas y no directamente en los tubos del medicamento.   Si su medicamento es muy caro, por favor, pngase en contacto con nuestra oficina llamando al [phone] y presione la opcin 4 o envenos un mensaje a travs de MyChart.   No podemos decirle cul ser su copago por los medicamentos por adelantado ya que esto es diferente dependiendo de la cobertura de su seguro.  Sin embargo, es posible que podamos encontrar un medicamento sustituto a menor costo o llenar un formulario para que el seguro cubra el medicamento que se considera necesario.   Si se requiere una autorizacin previa para que su compaa de seguros cubra su medicamento, por favor permtanos de 1 a 2 das hbiles para completar este proceso.  Los precios de los medicamentos varan con frecuencia dependiendo del lugar de dnde se surte la receta y alguna farmacias pueden ofrecer precios ms baratos.  El sitio web www.goodrx.com tiene cupones para medicamentos de diferentes farmacias. Los precios aqu no tienen en cuenta lo que podra costar con la ayuda del seguro (puede ser ms barato con su seguro), pero el sitio web puede darle el precio si no utiliz ningn seguro.  - Puede imprimir el cupn correspondiente y llevarlo con su receta a la farmacia.  - Tambin puede pasar por nuestra oficina durante el horario de atencin regular y recoger una tarjeta de cupones de GoodRx.  -   Si necesita que su receta se enve electrnicamente a una farmacia diferente, informe a nuestra oficina a travs de MyChart de [place] o por telfono llamando al [phone] y presione la opcin 4.  

## 2022-08-14 NOTE — Progress Notes (Signed)
Follow-Up Visit   Subjective  Beverly [name] is a 77 y.o. female who presents for the following: Annual Exam (History of BCC and SCC - The patient presents for Total-Body Skin Exam (TBSE) for skin cancer screening and mole check.  The patient has spots, moles and lesions to be evaluated, some may be new or changing and the patient has concerns that these could be cancer./).  The following portions of the chart were reviewed this encounter and updated as appropriate:   Tobacco  Allergies  Meds  Problems  Med Hx  Surg Hx  Fam Hx     Review of Systems:  No other skin or systemic complaints except as noted in HPI or Assessment and Plan.  Objective  Well appearing patient in no apparent distress; mood and affect are within normal limits.  A full examination was performed including scalp, head, eyes, ears, nose, lips, neck, chest, axillae, abdomen, back, buttocks, bilateral upper extremities, bilateral lower extremities, hands, feet, fingers, toes, fingernails, and toenails. All findings within normal limits unless otherwise noted below.  Face (10) Erythematous thin papules/macules with gritty scale.   Neck - Anterior (2) Erythematous stuck-on, waxy papule or plaque  Neck - Anterior Mild erythema   Assessment & Plan   History of Basal Cell Carcinoma of the Skin - No evidence of recurrence today - Recommend regular full body skin exams - Recommend daily broad spectrum sunscreen SPF 30+ to sun-exposed areas, reapply every 2 hours as needed.  - Call if any new or changing lesions are noted between office visits  History of Squamous Cell Carcinoma of the Skin - No evidence of recurrence today - No lymphadenopathy - Recommend regular full body skin exams - Recommend daily broad spectrum sunscreen SPF 30+ to sun-exposed areas, reapply every 2 hours as needed.  - Call if any new or changing lesions are noted between office visits  Lentigines - Scattered tan macules - Due to sun  exposure - Benign-appearing, observe - Recommend daily broad spectrum sunscreen SPF 30+ to sun-exposed areas, reapply every 2 hours as needed. - Call for any changes  Seborrheic Keratoses - Stuck-on, waxy, tan-brown papules and/or plaques  - Benign-appearing - Discussed benign etiology and prognosis. - Observe - Call for any changes  Melanocytic Nevi - Tan-brown and/or pink-flesh-colored symmetric macules and papules - Benign appearing on exam today - Observation - Call clinic for new or changing moles - Recommend daily use of broad spectrum spf 30+ sunscreen to sun-exposed areas.   Hemangiomas - Red papules - Discussed benign nature - Observe - Call for any changes  Actinic Damage - Chronic condition, secondary to cumulative UV/sun exposure - diffuse scaly erythematous macules with underlying dyspigmentation - Recommend daily broad spectrum sunscreen SPF 30+ to sun-exposed areas, reapply every 2 hours as needed.  - Staying in the shade or wearing long sleeves, sun glasses (UVA+UVB protection) and wide brim hats (4-inch brim around the entire circumference of the hat) are also recommended for sun protection.  - Call for new or changing lesions.  Skin cancer screening performed today.  AK (actinic keratosis) (10) Face  Destruction of lesion - Face Complexity: simple   Destruction method: cryotherapy   Informed consent: discussed and consent obtained   Timeout:  patient name, date of birth, surgical site, and procedure verified Lesion destroyed using liquid nitrogen: Yes   Region frozen until ice ball extended beyond lesion: Yes   Outcome: patient tolerated procedure well with no complications   Post-procedure details:  wound care instructions given    Inflamed seborrheic keratosis (2) Neck - Anterior  Destruction of lesion - Neck - Anterior Complexity: simple   Destruction method: cryotherapy   Informed consent: discussed and consent obtained   Timeout:  patient  name, date of birth, surgical site, and procedure verified Lesion destroyed using liquid nitrogen: Yes   Region frozen until ice ball extended beyond lesion: Yes   Outcome: patient tolerated procedure well with no complications   Post-procedure details: wound care instructions given    Rash Irritant contact dermatitis Neck - Anterior Irritation likely from rubbing by a shirt collar - Start mometasone cream qd-bid prn rash of neck  mometasone (ELOCON) 0.1 % cream - Neck - Anterior Apply 1 Application topically daily as needed (rash of neck).  Return in about 1 year (around 08/14/2023) for TBSE.  I, Ashok Cordia, CMA, am acting as scribe for Sarina Ser, MD . Documentation: I have reviewed the above documentation for accuracy and completeness, and I agree with the above.  Sarina Ser, MD

## 2022-08-16 ENCOUNTER — Encounter: Payer: Self-pay | Admitting: Dermatology

## 2022-08-21 ENCOUNTER — Encounter: Payer: Self-pay | Admitting: Orthopaedic Surgery

## 2022-08-21 ENCOUNTER — Ambulatory Visit (INDEPENDENT_AMBULATORY_CARE_PROVIDER_SITE_OTHER): Payer: Medicare Other | Admitting: Orthopaedic Surgery

## 2022-08-21 DIAGNOSIS — M1711 Unilateral primary osteoarthritis, right knee: Secondary | ICD-10-CM | POA: Diagnosis not present

## 2022-08-21 DIAGNOSIS — M170 Bilateral primary osteoarthritis of knee: Secondary | ICD-10-CM

## 2022-08-21 DIAGNOSIS — M1712 Unilateral primary osteoarthritis, left knee: Secondary | ICD-10-CM

## 2022-08-21 MED ORDER — HYALURONAN 88 MG/4ML IX SOSY
88.0000 mg | PREFILLED_SYRINGE | INTRA_ARTICULAR | Status: AC | PRN
  Administered 2022-08-21: 88 mg via INTRA_ARTICULAR

## 2022-08-21 MED ORDER — LIDOCAINE HCL 1 % IJ SOLN
3.0000 mL | INTRAMUSCULAR | Status: AC | PRN
  Administered 2022-08-21: 3 mL

## 2022-08-21 NOTE — Progress Notes (Signed)
   Procedure Note  Patient: Beverly [name]             Date of Birth: 02-Oct-1945           MRN: [id number]             Visit Date: 08/21/2022  Procedures: Visit Diagnoses:  1. Unilateral primary osteoarthritis, left knee   2. Unilateral primary osteoarthritis, right knee     Large Joint Inj: R knee on 08/21/2022 3:41 PM Indications: diagnostic evaluation and pain Details: 22 G 1.5 in needle, superolateral approach  Arthrogram: No  Medications: 3 mL lidocaine 1 %; 88 mg Hyaluronan 88 MG/4ML Outcome: tolerated well, no immediate complications Procedure, treatment alternatives, risks and benefits explained, specific risks discussed. Consent was given by the patient. Immediately prior to procedure a time out was called to verify the correct patient, procedure, equipment, support staff and site/side marked as required. Patient was prepped and draped in the usual sterile fashion.    Large Joint Inj: L knee on 08/21/2022 3:41 PM Indications: diagnostic evaluation and pain Details: 22 G 1.5 in needle, superolateral approach  Arthrogram: No  Medications: 3 mL lidocaine 1 %; 88 mg Hyaluronan 88 MG/4ML Outcome: tolerated well, no immediate complications Procedure, treatment alternatives, risks and benefits explained, specific risks discussed. Consent was given by the patient. Immediately prior to procedure a time out was called to verify the correct patient, procedure, equipment, support staff and site/side marked as required. Patient was prepped and draped in the usual sterile fashion.    The patient comes in today for scheduled hyaluronic acid injections with Monovisc in both knees to treat the pain from arthritis that is mainly at the patellofemoral joint.  She is an active 77 year old female and actually plans to play golf tomorrow and I am fine with her playing golf tomorrow.  Neither knee has an effusion today.  I did place Monovisc in both knees today without difficulty.  All questions and  concerns were answered and addressed.  She knows to wait at least 6 months between these types of injections.  Follow-up is as needed otherwise.  Lot [id number]

## 2022-09-11 ENCOUNTER — Ambulatory Visit: Payer: Medicare Other | Admitting: Dermatology

## 2022-10-02 ENCOUNTER — Other Ambulatory Visit: Payer: Self-pay

## 2022-10-02 ENCOUNTER — Encounter: Payer: Self-pay | Admitting: Orthopaedic Surgery

## 2022-10-02 ENCOUNTER — Ambulatory Visit (INDEPENDENT_AMBULATORY_CARE_PROVIDER_SITE_OTHER): Payer: Medicare Other | Admitting: Orthopaedic Surgery

## 2022-10-02 DIAGNOSIS — M2242 Chondromalacia patellae, left knee: Secondary | ICD-10-CM

## 2022-10-02 DIAGNOSIS — M2241 Chondromalacia patellae, right knee: Secondary | ICD-10-CM | POA: Diagnosis not present

## 2022-10-02 DIAGNOSIS — M1711 Unilateral primary osteoarthritis, right knee: Secondary | ICD-10-CM

## 2022-10-02 DIAGNOSIS — M1712 Unilateral primary osteoarthritis, left knee: Secondary | ICD-10-CM

## 2022-10-02 NOTE — Progress Notes (Signed)
The patient is well-known to my clinic.  She is 77 year old and thin and very active.  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  She says her knees are unstable and the patellas have never dislocated.  We have seen her for many years for this and have x-rays showing significant patellofemoral arthritic changes with well-maintained medial and lateral compartments and good alignment of both knees.  We have replaced both of her hips before as well.  She has had multiple steroid injections over time in both knees as well as hyaluronic acid.  She says at this point her knees are definitely affecting her mobility, her quality of life and actives daily living.  They do not wake her up at night and the pain is not severe in terms of a constant pain when she is even sitting.  It is more of activity related pain and is frustrating to her.  Both knees have patellofemoral crepitation and grinding throughout flexion extension.  Both knees are ligamentously stable on exam and have full range of motion with no effusion.  Both knees only hurt around the patellofemoral joint.  At this point we will obtain an MRI of both knees to assess the medial lateral compartments of the knees before considering patellofemoral arthroplasties.  I have spoken to my partner Dr. August Saucer who does patellofemoral arthroplasties and he is also recommended appropriately that we obtain MRIs first of her knees.  The patient understands that as well and is sent to see knows when she is having the MRIs she will call our office for follow-up appoint with Dr. August Saucer.

## 2022-10-04 DIAGNOSIS — N3090 Cystitis, unspecified without hematuria: Secondary | ICD-10-CM | POA: Diagnosis not present

## 2022-10-04 DIAGNOSIS — J432 Centrilobular emphysema: Secondary | ICD-10-CM | POA: Diagnosis not present

## 2022-10-04 DIAGNOSIS — R8290 Unspecified abnormal findings in urine: Secondary | ICD-10-CM | POA: Diagnosis not present

## 2022-10-04 DIAGNOSIS — Z87891 Personal history of nicotine dependence: Secondary | ICD-10-CM | POA: Diagnosis not present

## 2022-10-09 DIAGNOSIS — J432 Centrilobular emphysema: Secondary | ICD-10-CM | POA: Diagnosis not present

## 2022-10-09 DIAGNOSIS — R053 Chronic cough: Secondary | ICD-10-CM | POA: Diagnosis not present

## 2022-10-09 DIAGNOSIS — J479 Bronchiectasis, uncomplicated: Secondary | ICD-10-CM | POA: Diagnosis not present

## 2022-10-09 DIAGNOSIS — Z87891 Personal history of nicotine dependence: Secondary | ICD-10-CM | POA: Diagnosis not present

## 2022-10-16 DIAGNOSIS — N952 Postmenopausal atrophic vaginitis: Secondary | ICD-10-CM | POA: Diagnosis not present

## 2022-10-16 DIAGNOSIS — N3020 Other chronic cystitis without hematuria: Secondary | ICD-10-CM | POA: Diagnosis not present

## 2022-11-01 ENCOUNTER — Ambulatory Visit
Admission: RE | Admit: 2022-11-01 | Discharge: 2022-11-01 | Disposition: A | Discharge status: Home / Self Care | Payer: Medicare Other | Source: Ambulatory Visit | Attending: Orthopaedic Surgery | Admitting: Orthopaedic Surgery

## 2022-11-01 DIAGNOSIS — M2241 Chondromalacia patellae, right knee: Secondary | ICD-10-CM

## 2022-11-01 DIAGNOSIS — M1711 Unilateral primary osteoarthritis, right knee: Secondary | ICD-10-CM

## 2022-11-01 DIAGNOSIS — M1712 Unilateral primary osteoarthritis, left knee: Secondary | ICD-10-CM | POA: Diagnosis not present

## 2022-11-01 DIAGNOSIS — M25561 Pain in right knee: Secondary | ICD-10-CM | POA: Diagnosis not present

## 2022-11-01 DIAGNOSIS — M2242 Chondromalacia patellae, left knee: Secondary | ICD-10-CM

## 2022-11-01 DIAGNOSIS — M25562 Pain in left knee: Secondary | ICD-10-CM | POA: Diagnosis not present

## 2022-11-25 ENCOUNTER — Ambulatory Visit (INDEPENDENT_AMBULATORY_CARE_PROVIDER_SITE_OTHER): Payer: Medicare Other | Admitting: Orthopedic Surgery

## 2022-11-25 ENCOUNTER — Encounter: Payer: Self-pay | Admitting: Orthopedic Surgery

## 2022-11-25 DIAGNOSIS — M2242 Chondromalacia patellae, left knee: Secondary | ICD-10-CM | POA: Diagnosis not present

## 2022-11-25 DIAGNOSIS — M2241 Chondromalacia patellae, right knee: Secondary | ICD-10-CM

## 2022-11-25 NOTE — Progress Notes (Signed)
Office Visit Note   Patient: Beverly [name]           Date of Birth: 08-03-1945           MRN: [id number] Visit Date: 11/25/2022 Requested by: Tally Joe, MD [phone] [place],  [place] [address] PCP: Tally Joe, MD  Subjective: Chief Complaint  Patient presents with   Other    Bilateral knee pain discuss PF arthroplasty-referred by Dr August Saucer    HPI: Beverly [name] is a 77 y.o. female who presents to the office reporting bilateral knee pain right equal to left.  She has a long history of patellofemoral arthritis.  Starting around age 55 with slow but steady progression.  Any type of walking on uneven ground is hindered due to pain.  Ibuprofen is required on his as-needed basis fairly infrequently.  She does like to do a lot of hiking.  Flat ground walking no problem.  She has had many shots in both knees in the past including steroid and gel injections.  Does not wake her from sleep at night..                ROS: All systems reviewed are negative as they relate to the chief complaint within the history of present illness.  Patient denies fevers or chills.  Assessment & Plan: Visit Diagnoses:  1. Chondromalacia patellae, left knee   2. Chondromalacia patellae, right knee     Plan: Impression is bilateral patellofemoral arthritis.  MRI scan of both knees are reviewed and it really shows isolated patellofemoral arthritis with no increase in tibial tubercle trochlear groove distance.  Joint spaces and menisci intact in the medial and lateral compartments in both knees.  We talked about patellofemoral replacement and the inherent risk and benefits involved with that surgery along with the rehabilitation time.  Patient is going to consider her options.  Her husband recently had an ankle replacement about a week ago.  She may consider it for the winter.  Essentially she has to answer the question as to whether or not the risk of surgery are worth the benefits of her being able to  maintain an active lifestyle particularly in regards to hiking and being active on uneven surfaces.  Follow-Up Instructions: No follow-ups on file.   Orders:  No orders of the defined types were placed in this encounter.  No orders of the defined types were placed in this encounter.     Procedures: No procedures performed   Clinical Data: No additional findings.  Objective: Vital Signs: There were no vitals taken for this visit.  Physical Exam:  Constitutional: Patient appears well-developed HEENT:  Head: Normocephalic Eyes:EOM are normal Neck: Normal range of motion Cardiovascular: Normal rate Pulmonary/chest: Effort normal Neurologic: Patient is alert Skin: Skin is warm Psychiatric: Patient has normal mood and affect  Ortho Exam: Ortho exam demonstrates normal alignment.  No increased Q angle.  No effusion in either knee.  Collateral and cruciate ligaments are stable.  Patellofemoral crepitus is present.  Negative patellar apprehension.  No joint line tenderness in either knee.  Pedal pulses palpable.  Quad tone excellent for a patient who is 63.  Specialty Comments:  No specialty comments available.  Imaging: No results found.   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  Primary osteoarthritis of left hip 12/07/2018   Primary osteoarthritis of right hip 12/07/2018   Pain of right hip joint 05/21/2017   Trochanteric bursitis, right hip 04/23/2017   Pain in right hip 04/23/2017   Hearing loss of right ear due to cerumen impaction 10/25/2016   Chronic rhinitis 07/04/2015   Cough 07/04/2015   Endometrial polyp 01/11/2011   Past Medical History:  Diagnosis Date   Atypical mole 11/03/2013   R prox post lat thigh    Basal cell carcinoma    mid back   Basal cell carcinoma 04/01/2019   L  chest parasternal    Basal cell carcinoma 09/11/2017   L mid back at braline 3.0 cm lat to spine   Emphysema lung (HCC)    Mild no symptoms Found on coronary CT scan   Esophageal dysmotility    GERD (gastroesophageal reflux disease)    occassional   OA (osteoarthritis)    hips and knees   Pneumonia    history of    PONV (postoperative nausea and vomiting)    Squamous cell carcinoma of skin 06/11/2021   L mid pretibia, [facility]    Family History  Problem Relation Age of Onset   Allergic rhinitis Neg Hx    Angioedema Neg Hx    Asthma Neg Hx    Atopy Neg Hx    Eczema Neg Hx    Immunodeficiency Neg Hx    Urticaria Neg Hx     Past Surgical History:  Procedure Laterality Date   COLONOSCOPY     FRACTURE SURGERY  2009   left ankle   INGUINAL HERNIA REPAIR Right 08/22/2021   Procedure: OPEN RIGHT INGUINAL HERNIA REPAIR WITH MESH;  Surgeon: Berna Bue, MD;  Location: [facility];  Service: General;  Laterality: Right;   LAPAROSCOPY  1992   infection after tubal   SHOULDER OPEN ROTATOR CUFF REPAIR Right 2009   TOTAL HIP ARTHROPLASTY Left 01/29/2019   Procedure: LEFT TOTAL HIP ARTHROPLASTY ANTERIOR APPROACH;  Surgeon: Kathryne Hitch, MD;  Location: [facility];  Service: Orthopedics;  Laterality: Left;   TOTAL HIP ARTHROPLASTY Right 09/24/2019   Procedure: RIGHT TOTAL HIP ARTHROPLASTY ANTERIOR APPROACH;  Surgeon: Kathryne Hitch, MD;  Location: [facility];  Service: Orthopedics;  Laterality: Right;   TUBAL LIGATION  1992   Social History   Occupational History   Not on file  Tobacco Use   Smoking status: Former    Types: Cigarettes    Quit date: 12/27/1979    Years since quitting: 42.9   Smokeless tobacco: Never  Vaping Use   Vaping Use: Never used  Substance and Sexual Activity   Alcohol use: Yes    Alcohol/week: 3.0 standard drinks of alcohol    Types: 3 Glasses of wine per week    Comment: 1-2 a week   Drug use: No   Sexual activity: Not Currently    Birth  control/protection: Post-menopausal

## 2022-12-13 DIAGNOSIS — Z1211 Encounter for screening for malignant neoplasm of colon: Secondary | ICD-10-CM | POA: Diagnosis not present

## 2022-12-13 DIAGNOSIS — R1310 Dysphagia, unspecified: Secondary | ICD-10-CM | POA: Diagnosis not present

## 2022-12-13 DIAGNOSIS — I700 Atherosclerosis of aorta: Secondary | ICD-10-CM | POA: Diagnosis not present

## 2022-12-13 DIAGNOSIS — Z1331 Encounter for screening for depression: Secondary | ICD-10-CM | POA: Diagnosis not present

## 2022-12-13 DIAGNOSIS — Z0000 Encounter for general adult medical examination without abnormal findings: Secondary | ICD-10-CM | POA: Diagnosis not present

## 2022-12-13 DIAGNOSIS — R7309 Other abnormal glucose: Secondary | ICD-10-CM | POA: Diagnosis not present

## 2022-12-13 DIAGNOSIS — K5900 Constipation, unspecified: Secondary | ICD-10-CM | POA: Diagnosis not present

## 2022-12-13 DIAGNOSIS — R946 Abnormal results of thyroid function studies: Secondary | ICD-10-CM | POA: Diagnosis not present

## 2022-12-13 DIAGNOSIS — R7989 Other specified abnormal findings of blood chemistry: Secondary | ICD-10-CM | POA: Diagnosis not present

## 2022-12-13 DIAGNOSIS — E7800 Pure hypercholesterolemia, unspecified: Secondary | ICD-10-CM | POA: Diagnosis not present

## 2022-12-13 DIAGNOSIS — J439 Emphysema, unspecified: Secondary | ICD-10-CM | POA: Diagnosis not present

## 2022-12-13 DIAGNOSIS — Z87440 Personal history of urinary (tract) infections: Secondary | ICD-10-CM | POA: Diagnosis not present

## 2022-12-13 DIAGNOSIS — I2510 Atherosclerotic heart disease of native coronary artery without angina pectoris: Secondary | ICD-10-CM | POA: Diagnosis not present

## 2023-02-19 DIAGNOSIS — R053 Chronic cough: Secondary | ICD-10-CM | POA: Diagnosis not present

## 2023-02-19 DIAGNOSIS — Z23 Encounter for immunization: Secondary | ICD-10-CM | POA: Diagnosis not present

## 2023-02-20 ENCOUNTER — Other Ambulatory Visit: Payer: Self-pay | Admitting: Pulmonary Disease

## 2023-02-20 DIAGNOSIS — R053 Chronic cough: Secondary | ICD-10-CM

## 2023-02-24 ENCOUNTER — Ambulatory Visit
Admission: RE | Admit: 2023-02-24 | Discharge: 2023-02-24 | Disposition: A | Discharge status: Home / Self Care | Payer: Medicare Other | Source: Ambulatory Visit | Attending: Pulmonary Disease | Admitting: Pulmonary Disease

## 2023-02-24 DIAGNOSIS — R053 Chronic cough: Secondary | ICD-10-CM

## 2023-02-24 DIAGNOSIS — J439 Emphysema, unspecified: Secondary | ICD-10-CM | POA: Diagnosis not present

## 2023-02-24 DIAGNOSIS — R918 Other nonspecific abnormal finding of lung field: Secondary | ICD-10-CM | POA: Diagnosis not present

## 2023-02-24 DIAGNOSIS — I700 Atherosclerosis of aorta: Secondary | ICD-10-CM | POA: Diagnosis not present

## 2023-02-24 DIAGNOSIS — J479 Bronchiectasis, uncomplicated: Secondary | ICD-10-CM | POA: Diagnosis not present

## 2023-02-24 DIAGNOSIS — K449 Diaphragmatic hernia without obstruction or gangrene: Secondary | ICD-10-CM | POA: Diagnosis not present

## 2023-03-03 DIAGNOSIS — Z23 Encounter for immunization: Secondary | ICD-10-CM | POA: Diagnosis not present

## 2023-03-06 DIAGNOSIS — Z87891 Personal history of nicotine dependence: Secondary | ICD-10-CM | POA: Diagnosis not present

## 2023-03-06 DIAGNOSIS — R053 Chronic cough: Secondary | ICD-10-CM | POA: Diagnosis not present

## 2023-03-11 DIAGNOSIS — H6123 Impacted cerumen, bilateral: Secondary | ICD-10-CM | POA: Diagnosis not present

## 2023-03-13 DIAGNOSIS — R053 Chronic cough: Secondary | ICD-10-CM | POA: Diagnosis not present

## 2023-03-13 DIAGNOSIS — Z87891 Personal history of nicotine dependence: Secondary | ICD-10-CM | POA: Diagnosis not present

## 2023-03-17 DIAGNOSIS — R053 Chronic cough: Secondary | ICD-10-CM | POA: Diagnosis not present

## 2023-03-24 DIAGNOSIS — R030 Elevated blood-pressure reading, without diagnosis of hypertension: Secondary | ICD-10-CM | POA: Diagnosis not present

## 2023-03-24 DIAGNOSIS — R002 Palpitations: Secondary | ICD-10-CM | POA: Diagnosis not present

## 2023-03-24 DIAGNOSIS — R053 Chronic cough: Secondary | ICD-10-CM | POA: Diagnosis not present

## 2023-03-24 DIAGNOSIS — R42 Dizziness and giddiness: Secondary | ICD-10-CM | POA: Diagnosis not present

## 2023-03-24 DIAGNOSIS — M2241 Chondromalacia patellae, right knee: Secondary | ICD-10-CM | POA: Diagnosis not present

## 2023-03-24 DIAGNOSIS — F439 Reaction to severe stress, unspecified: Secondary | ICD-10-CM | POA: Diagnosis not present

## 2023-03-25 ENCOUNTER — Ambulatory Visit (INDEPENDENT_AMBULATORY_CARE_PROVIDER_SITE_OTHER): Payer: Medicare Other | Admitting: Neurology

## 2023-03-25 ENCOUNTER — Encounter: Payer: Self-pay | Admitting: Neurology

## 2023-03-25 ENCOUNTER — Telehealth: Payer: Self-pay | Admitting: Orthopedic Surgery

## 2023-03-25 VITALS — BP 179/85 | HR 77 | Ht 67.0 in | Wt 136.0 lb

## 2023-03-25 DIAGNOSIS — F5104 Psychophysiologic insomnia: Secondary | ICD-10-CM | POA: Diagnosis not present

## 2023-03-25 DIAGNOSIS — R269 Unspecified abnormalities of gait and mobility: Secondary | ICD-10-CM | POA: Diagnosis not present

## 2023-03-25 DIAGNOSIS — R42 Dizziness and giddiness: Secondary | ICD-10-CM | POA: Diagnosis not present

## 2023-03-25 MED ORDER — TRAZODONE HCL 50 MG PO TABS
100.0000 mg | ORAL_TABLET | Freq: Every day | ORAL | 11 refills | Status: DC
Start: 2023-03-25 — End: 2023-04-04

## 2023-03-25 NOTE — Telephone Encounter (Signed)
Patient left message to cancel right patellofemoral replacement with Dr. August Saucer on 04-08-23 at [facility].  She said she has other things going on but didn't say whether she wanted to reschedule or not.  I called patient and left message on voicemail providing my name and direct number.

## 2023-03-25 NOTE — Progress Notes (Signed)
Chief Complaint  Patient presents with   New Patient (Initial Visit)    Rm15, alone, NP Urgent paper referral for Disequilibrium: off balance, lightheaded, gait abnormality, orthostatic bp completed   ASSESSMENT AND PLAN  Beverly [name] is a 77 y.o. female   Chronic insomnia Dizziness, imbalance gait  Multiple arthritis, hyperreflexia on examination, Babinski signs,  MRI of cervical spine to rule out cervical spondylitic myelopathy  Trazodone 50 mg titrating to 100 mg every night  Follow-up depend on the MRI result  DIAGNOSTIC DATA (LABS, IMAGING, TESTING) - I reviewed patient records, labs, notes, testing and imaging myself where available.   MEDICAL HISTORY:  Beverly [name], is a 77 year old female seen in request by her primary care from [facility] Dr. Tally Joe, for evaluation of dizziness, imbalance sensation, initial evaluation March 25, 2023   History is obtained from the patient and review of electronic medical records. I personally reviewed pertinent available imaging films in PACS.   PMHx of  HLD Left ankle fracture Bilateral hip replacement  She used to be very active, walks 5 miles daily, but gradually slowed down since 2024, now barely 2 miles every other day, she has multiple arthritis, had bilateral hip replacement, pending bilateral patellar replacement, complains of knee and hip discomfort  She also complains of shortness of breath with exertion, under the care of pulmonologist,   Ct chest in Oct 2024: 1. Chronic areas of bronchiectasis in the anterior right upper lobe, right middle lobe and lingula with areas of volume loss and peribronchial opacity, overall mild improvement from prior exam. Lesser areas of bronchiectasis and peribronchovascular nodularity within the lower lobes, with definite basilar improvement. 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 2. Small hiatal hernia. 3. Mild emphysema.  She has a lot of stress,  difficulty falling to sleep, since summer 2024, she had rapid decline of her functional status, feeling tired, unsteady sensation, dizziness, mainly feel that when she walking, denies spinning sensation, denies bowel or bladder incontinence, denies significant neck pain, feel her body leaning towards the right side  Orthostatic blood pressure showed mild elevated blood pressure, but no significant orthostatic changes   PHYSICAL EXAM:   Vitals:   03/25/23 1401  BP: (!) 179/85  Pulse: 77  Weight: 136 lb (61.7 kg)  Height: 5\' 7"  (1.702 m)   03/25/2023 2:01 PM 03/25/2023 2:03 PM 03/25/2023 2:04 PM   Orthostatic BP 179/85 194/77 173/96  BP Location  Right Arm Right Arm  Patient Position  Sitting Standing  Pulse 77 78 80    Body mass index is 21.3 kg/m.  PHYSICAL EXAMNIATION:  Gen: NAD, conversant, well nourised, well groomed                     Cardiovascular: Regular rate rhythm, no peripheral edema, warm, nontender. Eyes: Conjunctivae clear without exudates or hemorrhage Neck: Supple, no carotid bruits. Pulmonary: Clear to auscultation bilaterally   NEUROLOGICAL EXAM:  MENTAL STATUS: Speech/cognition: Awake, alert, oriented to history taking and casual conversation CRANIAL NERVES: CN II: Visual fields are full to confrontation. Pupils are round equal and briskly reactive to light. CN III, IV, VI: extraocular movement are normal. No ptosis. CN V: Facial sensation is intact to light touch CN VII: Face is symmetric with normal eye closure  CN VIII: Hearing is normal to causal conversation. CN IX, X: Phonation is normal. CN XI: Head turning and shoulder shrug are intact  MOTOR: There is no pronator drift of out-stretched arms.  Muscle bulk and tone are normal. Muscle strength is normal.  REFLEXES: Reflexes are 2+ and symmetric at the biceps, triceps 3/3 knees, and ankles. Plantar responses are answered bilaterally  SENSORY: Intact to light touch, pinprick and vibratory  sensation are intact in fingers and toes.  COORDINATION: There is no trunk or limb dysmetria noted.  GAIT/STANCE: Need push-up to get up from seated position, cautious,  REVIEW OF SYSTEMS:  Full 14 system review of systems performed and notable only for as above All other review of systems were negative.   ALLERGIES: Allergies  Allergen Reactions   Macrodantin Hives and Rash    Other Reaction(s): hives, Other (See Comments), Unknown   Rosuvastatin Other (See Comments)    Other Reaction(s): myalgia/arthralgia, Myalgias   Wound Dressings Itching   Sulfa Antibiotics Palpitations, Swelling and Anxiety    Other Reaction(s): heart races, itchy    HOME MEDICATIONS: Current Outpatient Medications  Medication Sig Dispense Refill   azelastine (ASTELIN) 0.1 % nasal spray Place 2 sprays into both nostrils as needed for rhinitis. Use in each nostril as directed 30 mL 12   bimatoprost (LATISSE) 0.03 % ophthalmic solution PLACE ONE DROP ON APPLICATOR AND APPLY EVENLY ALONG THE SKIN OF THE UPPER EYELID AT BASE OF EYELASHES ONCE DAILY AT BEDTIME TO BOTH EYES 3 mL 4   Calcium Carb-Cholecalciferol (CALCIUM 600 + D PO) Take 1 tablet by mouth daily.     estradiol (VIVELLE-DOT) 0.025 MG/24HR Place 1 patch onto the skin 2 (two) times a week. ALORA  (Wednesdays & Saturdays)     Multiple Vitamins-Minerals (MULTIVITAMIN WITH MINERALS) tablet Take 1 tablet by mouth daily.     Oil of Oregano 1500 MG CAPS Take 1,500 mg by mouth daily.     OVER THE COUNTER MEDICATION Take 7-8 capsules by mouth at bedtime. [place] RHUBARB 400 mg/capsule     pantoprazole (PROTONIX) 40 MG tablet TAKE 1 TABLET BY MOUTH TWICE A DAY 180 tablet 1   pravastatin (PRAVACHOL) 10 MG tablet Take 10 mg by mouth once a week.     Probiotic Product (PROBIOTIC PO) Take 1 capsule by mouth daily. 5 Billion CFUs     progesterone (PROMETRIUM) 100 MG capsule Take 100 mg by mouth every Monday, Wednesday, and Friday.      psyllium (METAMUCIL) 58.6 %  powder Take 1 packet by mouth 2 (two) times daily.     vitamin B-12 (CYANOCOBALAMIN) 1000 MCG tablet Take 1,000 mcg by mouth daily.     No current facility-administered medications for this visit.    PAST MEDICAL HISTORY: Past Medical History:  Diagnosis Date   Atypical mole 11/03/2013   R prox post lat thigh    Basal cell carcinoma    mid back   Basal cell carcinoma 04/01/2019   L chest parasternal    Basal cell carcinoma 09/11/2017   L mid back at braline 3.0 cm lat to spine   Emphysema lung (HCC)    Mild no symptoms Found on coronary CT scan   Esophageal dysmotility    GERD (gastroesophageal reflux disease)    occassional   OA (osteoarthritis)    hips and knees   Pneumonia    history of    PONV (postoperative nausea and vomiting)    Squamous cell carcinoma of skin 06/11/2021   L mid pretibia, [facility]    PAST SURGICAL HISTORY: Past Surgical History:  Procedure Laterality Date   COLONOSCOPY     FRACTURE SURGERY  2009   left ankle  INGUINAL HERNIA REPAIR Right 08/22/2021   Procedure: OPEN RIGHT INGUINAL HERNIA REPAIR WITH MESH;  Surgeon: Berna Bue, MD;  Location: [facility];  Service: General;  Laterality: Right;   LAPAROSCOPY  1992   infection after tubal   SHOULDER OPEN ROTATOR CUFF REPAIR Right 2009   TOTAL HIP ARTHROPLASTY Left 01/29/2019   Procedure: LEFT TOTAL HIP ARTHROPLASTY ANTERIOR APPROACH;  Surgeon: Kathryne Hitch, MD;  Location: [facility];  Service: Orthopedics;  Laterality: Left;   TOTAL HIP ARTHROPLASTY Right 09/24/2019   Procedure: RIGHT TOTAL HIP ARTHROPLASTY ANTERIOR APPROACH;  Surgeon: Kathryne Hitch, MD;  Location: [facility];  Service: Orthopedics;  Laterality: Right;   TUBAL LIGATION  1992    FAMILY HISTORY: Family History  Problem Relation Age of Onset   Allergic rhinitis Neg Hx    Angioedema Neg Hx    Asthma Neg Hx    Atopy Neg Hx    Eczema Neg Hx    Immunodeficiency Neg Hx    Urticaria Neg Hx     SOCIAL HISTORY: Social  History   Socioeconomic History   Marital status: Married    Spouse name: Not on file   Number of children: Not on file   Years of education: Not on file   Highest education level: Not on file  Occupational History   Not on file  Tobacco Use   Smoking status: Former    Current packs/day: 0.00    Types: Cigarettes    Quit date: 12/27/1979    Years since quitting: 43.2   Smokeless tobacco: Never  Vaping Use   Vaping status: Never Used  Substance and Sexual Activity   Alcohol use: Yes    Alcohol/week: 3.0 standard drinks of alcohol    Types: 3 Glasses of wine per week    Comment: 1-2 a week   Drug use: No   Sexual activity: Not Currently    Birth control/protection: Post-menopausal  Other Topics Concern   Not on file  Social History Narrative   Not on file   Social Determinants of Health   Financial Resource Strain: Not on file  Food Insecurity: Not on file  Transportation Needs: Not on file  Physical Activity: Not on file  Stress: Not on file  Social Connections: Not on file  Intimate Partner Violence: Not on file      Levert Feinstein, M.D. Ph.D.  [facility] [address], [address] [place], [place] [address] Ph: [phone] Fax: [phone]  CC:  Tally Joe, MD [address] [place],  [place] [address]  Tally Joe, MD

## 2023-03-26 ENCOUNTER — Telehealth: Payer: Self-pay | Admitting: Neurology

## 2023-03-26 DIAGNOSIS — R053 Chronic cough: Secondary | ICD-10-CM | POA: Diagnosis not present

## 2023-03-26 DIAGNOSIS — J479 Bronchiectasis, uncomplicated: Secondary | ICD-10-CM | POA: Diagnosis not present

## 2023-03-26 NOTE — Telephone Encounter (Signed)
medicare/[employer] sup NPR sent to [facility] [phone] 

## 2023-03-27 ENCOUNTER — Encounter: Payer: Self-pay | Admitting: Neurology

## 2023-03-28 NOTE — Telephone Encounter (Signed)
Ok thx.

## 2023-04-04 MED ORDER — TRAZODONE HCL 50 MG PO TABS: 100.0000 mg | ORAL_TABLET | Freq: Every day | ORAL | 11 refills | Status: DC

## 2023-04-04 NOTE — Telephone Encounter (Signed)
Called [facility] and cancelled trazodone as patient wishes to use [facility].

## 2023-04-06 ENCOUNTER — Ambulatory Visit
Admission: RE | Admit: 2023-04-06 | Discharge: 2023-04-06 | Disposition: A | Discharge status: Home / Self Care | Payer: Medicare Other | Source: Ambulatory Visit | Attending: Neurology | Admitting: Neurology

## 2023-04-06 DIAGNOSIS — R42 Dizziness and giddiness: Secondary | ICD-10-CM

## 2023-04-06 DIAGNOSIS — R269 Unspecified abnormalities of gait and mobility: Secondary | ICD-10-CM | POA: Diagnosis not present

## 2023-04-07 ENCOUNTER — Telehealth (INDEPENDENT_AMBULATORY_CARE_PROVIDER_SITE_OTHER): Payer: Medicare Other | Admitting: Neurology

## 2023-04-07 DIAGNOSIS — R269 Unspecified abnormalities of gait and mobility: Secondary | ICD-10-CM

## 2023-04-07 DIAGNOSIS — R42 Dizziness and giddiness: Secondary | ICD-10-CM | POA: Diagnosis not present

## 2023-04-07 DIAGNOSIS — F5104 Psychophysiologic insomnia: Secondary | ICD-10-CM

## 2023-04-07 NOTE — Telephone Encounter (Signed)
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

## 2023-04-08 ENCOUNTER — Ambulatory Visit (HOSPITAL_COMMUNITY): Admit: 2023-04-08 | Payer: Medicare Other | Admitting: Orthopedic Surgery

## 2023-04-10 DIAGNOSIS — M5011 Cervical disc disorder with radiculopathy,  high cervical region: Secondary | ICD-10-CM | POA: Diagnosis not present

## 2023-04-10 DIAGNOSIS — M9901 Segmental and somatic dysfunction of cervical region: Secondary | ICD-10-CM | POA: Diagnosis not present

## 2023-04-10 NOTE — Telephone Encounter (Signed)
I have called patient, she complains of frequent dizziness since summer 2024, described as unsteadiness, not vertigo, tried over-the-counter Dramamine, meclizine in the past, seems to help her symptoms  She is taking trazodone along with over-the-counter supplement, has helped her sleep  We again went over her MRI cervical findings, multilevel degenerative changes most prominent C5-6, with mild canal, left greater than right severe foraminal narrowing  She has intermittent neck pain radiating pain to bilateral shoulder, has pending chiropractor appointment, advised her not to do adjustment,  Will return to clinic in 2 to 3 months to go over MRI findings in detail  Please see the MyChart message reply(ies) for my assessment and plan.    This patient gave consent for this Medical Advice Message and is aware that it may result in a bill to [employer], as well as the possibility of receiving a bill for a co-payment or deductible. They are an established patient, but are not seeking medical advice exclusively about a problem treated during an in person or video visit in the last seven days. I did not recommend an in person or video visit within seven days of my reply.    I spent a total of 12 minutes cumulative time within 7 days through [employer].  Levert Feinstein, MD

## 2023-04-11 ENCOUNTER — Other Ambulatory Visit: Payer: Self-pay | Admitting: Medical Genetics

## 2023-04-11 DIAGNOSIS — Z006 Encounter for examination for normal comparison and control in clinical research program: Secondary | ICD-10-CM

## 2023-04-14 DIAGNOSIS — M5011 Cervical disc disorder with radiculopathy,  high cervical region: Secondary | ICD-10-CM | POA: Diagnosis not present

## 2023-04-14 DIAGNOSIS — M9901 Segmental and somatic dysfunction of cervical region: Secondary | ICD-10-CM | POA: Diagnosis not present

## 2023-04-16 DIAGNOSIS — M9901 Segmental and somatic dysfunction of cervical region: Secondary | ICD-10-CM | POA: Diagnosis not present

## 2023-04-16 DIAGNOSIS — M5011 Cervical disc disorder with radiculopathy,  high cervical region: Secondary | ICD-10-CM | POA: Diagnosis not present

## 2023-04-21 ENCOUNTER — Encounter: Payer: Medicare Other | Admitting: Surgical

## 2023-04-22 DIAGNOSIS — M9901 Segmental and somatic dysfunction of cervical region: Secondary | ICD-10-CM | POA: Diagnosis not present

## 2023-04-22 DIAGNOSIS — M5011 Cervical disc disorder with radiculopathy,  high cervical region: Secondary | ICD-10-CM | POA: Diagnosis not present

## 2023-04-23 DIAGNOSIS — R053 Chronic cough: Secondary | ICD-10-CM | POA: Diagnosis not present

## 2023-04-23 DIAGNOSIS — J479 Bronchiectasis, uncomplicated: Secondary | ICD-10-CM | POA: Diagnosis not present

## 2023-04-24 DIAGNOSIS — M5011 Cervical disc disorder with radiculopathy,  high cervical region: Secondary | ICD-10-CM | POA: Diagnosis not present

## 2023-04-24 DIAGNOSIS — M9901 Segmental and somatic dysfunction of cervical region: Secondary | ICD-10-CM | POA: Diagnosis not present

## 2023-04-28 DIAGNOSIS — M5011 Cervical disc disorder with radiculopathy,  high cervical region: Secondary | ICD-10-CM | POA: Diagnosis not present

## 2023-04-28 DIAGNOSIS — M9901 Segmental and somatic dysfunction of cervical region: Secondary | ICD-10-CM | POA: Diagnosis not present

## 2023-04-30 DIAGNOSIS — M5011 Cervical disc disorder with radiculopathy,  high cervical region: Secondary | ICD-10-CM | POA: Diagnosis not present

## 2023-04-30 DIAGNOSIS — M9901 Segmental and somatic dysfunction of cervical region: Secondary | ICD-10-CM | POA: Diagnosis not present

## 2023-05-05 DIAGNOSIS — M5011 Cervical disc disorder with radiculopathy,  high cervical region: Secondary | ICD-10-CM | POA: Diagnosis not present

## 2023-05-05 DIAGNOSIS — M9901 Segmental and somatic dysfunction of cervical region: Secondary | ICD-10-CM | POA: Diagnosis not present

## 2023-05-06 ENCOUNTER — Other Ambulatory Visit (HOSPITAL_COMMUNITY): Payer: Medicare Other | Attending: Medical Genetics

## 2023-05-07 DIAGNOSIS — M9901 Segmental and somatic dysfunction of cervical region: Secondary | ICD-10-CM | POA: Diagnosis not present

## 2023-05-07 DIAGNOSIS — M5011 Cervical disc disorder with radiculopathy,  high cervical region: Secondary | ICD-10-CM | POA: Diagnosis not present

## 2023-05-08 DIAGNOSIS — R2689 Other abnormalities of gait and mobility: Secondary | ICD-10-CM | POA: Diagnosis not present

## 2023-05-10 ENCOUNTER — Other Ambulatory Visit: Payer: Self-pay

## 2023-05-10 ENCOUNTER — Emergency Department (HOSPITAL_BASED_OUTPATIENT_CLINIC_OR_DEPARTMENT_OTHER): Payer: Medicare Other

## 2023-05-10 ENCOUNTER — Emergency Department (HOSPITAL_BASED_OUTPATIENT_CLINIC_OR_DEPARTMENT_OTHER)
Admission: EM | Admit: 2023-05-10 | Discharge: 2023-05-10 | Disposition: A | Discharge status: Home / Self Care | Payer: Medicare Other | Attending: Emergency Medicine | Admitting: Emergency Medicine

## 2023-05-10 ENCOUNTER — Encounter (HOSPITAL_BASED_OUTPATIENT_CLINIC_OR_DEPARTMENT_OTHER): Payer: Self-pay | Admitting: Emergency Medicine

## 2023-05-10 DIAGNOSIS — R42 Dizziness and giddiness: Secondary | ICD-10-CM | POA: Diagnosis not present

## 2023-05-10 DIAGNOSIS — R110 Nausea: Secondary | ICD-10-CM | POA: Insufficient documentation

## 2023-05-10 DIAGNOSIS — R55 Syncope and collapse: Secondary | ICD-10-CM | POA: Diagnosis not present

## 2023-05-10 DIAGNOSIS — G238 Other specified degenerative diseases of basal ganglia: Secondary | ICD-10-CM | POA: Diagnosis not present

## 2023-05-10 DIAGNOSIS — G9389 Other specified disorders of brain: Secondary | ICD-10-CM | POA: Diagnosis not present

## 2023-05-10 LAB — URINALYSIS, ROUTINE W REFLEX MICROSCOPIC
Bilirubin Urine: NEGATIVE
Glucose, UA: NEGATIVE mg/dL
Hgb urine dipstick: NEGATIVE
Ketones, ur: NEGATIVE mg/dL
Leukocytes,Ua: NEGATIVE
Nitrite: NEGATIVE
Protein, ur: NEGATIVE mg/dL
Specific Gravity, Urine: 1.008 (ref 1.005–1.030)
pH: 6.5 (ref 5.0–8.0)

## 2023-05-10 LAB — BASIC METABOLIC PANEL
Anion gap: 8 (ref 5–15)
BUN: 17 mg/dL (ref 8–23)
CO2: 28 mmol/L (ref 22–32)
Calcium: 9.2 mg/dL (ref 8.9–10.3)
Chloride: 100 mmol/L (ref 98–111)
Creatinine, Ser: 0.71 mg/dL (ref 0.44–1.00)
GFR, Estimated: >60 mL/min (ref >60)
Glucose, Bld: 120 mg/dL — ABNORMAL HIGH (ref 70–99)
Potassium: 3.8 mmol/L (ref 3.5–5.1)
Sodium: 136 mmol/L (ref 135–145)

## 2023-05-10 LAB — CBC
HCT: 41.5 % (ref 36.0–46.0)
Hemoglobin: 13.6 g/dL (ref 12.0–15.0)
MCH: 26.7 pg (ref 26.0–34.0)
MCHC: 32.8 g/dL (ref 30.0–36.0)
MCV: 81.5 fL (ref 80.0–100.0)
Platelets: 260 10*3/uL (ref 150–400)
RBC: 5.09 MIL/uL (ref 3.87–5.11)
RDW: 14.5 % (ref 11.5–15.5)
WBC: 8.2 10*3/uL (ref 4.0–10.5)
nRBC: 0 % (ref 0.0–0.2)

## 2023-05-10 LAB — CBG MONITORING, ED: Glucose-Capillary: 120 mg/dL — ABNORMAL HIGH (ref 70–99)

## 2023-05-10 LAB — TROPONIN I (HIGH SENSITIVITY): Troponin I (High Sensitivity): 3 ng/L (ref <18)

## 2023-05-10 NOTE — ED Provider Notes (Signed)
[place] [facility]ERGENCY DEPARTMENT AT [facility] Provider Note   CSN: [id number] Arrival date & time: 05/10/23  1128     History  Chief Complaint  Patient presents with   Dizziness    Beverly [name] is a 77 y.o. female.  The history is provided by the patient and the spouse.  Dizziness Patient presents to the ED with complaints of dizziness, instability, nausea for the last 6 months.  She was seen at urgent care today because she had 1 episode Friday of becoming "white as a ghost and needing to sit down but was helped with food and diet Coke."  And was sent here because of the PVC noticed on ECG with possible cardiac concerns.  She states she has had these in the past and has been asymptomatic.  Previously seen by both neurology and ENT.  Has had extensive testing including MRI and CT done without any remarkable findings.  She says she also was previously diagnosed with bronchiectasis early in the fall but has been using incentive spirometry.  States she drinks close to 90 ounces of water a day.  Denies headache, fatigue, weakness, chest pain, shortness of breath, abdominal pain, diarrhea, constipation, vomiting, dysuria, hematuria.     Home Medications Prior to Admission medications   Medication Sig Start Date End Date Taking? Authorizing Provider  azelastine (ASTELIN) 0.1 % nasal spray Place 2 sprays into both nostrils as needed for rhinitis. Use in each nostril as directed 05/02/22   Leslye Peer, MD  bimatoprost (LATISSE) 0.03 % ophthalmic solution PLACE ONE DROP ON APPLICATOR AND APPLY EVENLY ALONG THE SKIN OF THE UPPER EYELID AT BASE OF EYELASHES ONCE DAILY AT BEDTIME TO BOTH EYES 07/22/22   Deirdre Evener, MD  Calcium Carb-Cholecalciferol (CALCIUM 600 + D PO) Take 1 tablet by mouth daily.    [provider]  estradiol (VIVELLE-DOT) 0.025 MG/24HR Place 1 patch onto the skin 2 (two) times a week. ALORA  (Wednesdays & Saturdays)    [provider]   Multiple Vitamins-Minerals (MULTIVITAMIN WITH MINERALS) tablet Take 1 tablet by mouth daily.    [provider]  Oil of Oregano 1500 MG CAPS Take 1,500 mg by mouth daily.    [provider]  OVER THE COUNTER MEDICATION Take 7-8 capsules by mouth at bedtime. [place] RHUBARB 400 mg/capsule    [provider]  pantoprazole (PROTONIX) 40 MG tablet TAKE 1 TABLET BY MOUTH TWICE A DAY 05/28/22   Leslye Peer, MD  pravastatin (PRAVACHOL) 10 MG tablet Take 10 mg by mouth once a week. 04/27/21   [provider]  Probiotic Product (PROBIOTIC PO) Take 1 capsule by mouth daily. 5 Billion CFUs    [provider]  progesterone (PROMETRIUM) 100 MG capsule Take 100 mg by mouth every Monday, Wednesday, and Friday.  11/19/18   [provider]  psyllium (METAMUCIL) 58.6 % powder Take 1 packet by mouth 2 (two) times daily.    [provider]  traZODone (DESYREL) 50 MG tablet Take 2 tablets (100 mg total) by mouth at bedtime. 04/04/23   Levert Feinstein, MD  vitamin B-12 (CYANOCOBALAMIN) 1000 MCG tablet Take 1,000 mcg by mouth daily.    [provider]      Allergies    Macrodantin, Rosuvastatin, Wound dressings, and Sulfa antibiotics    Review of Systems   Review of Systems  Neurological:  Positive for dizziness.    Physical Exam Updated Vital Signs BP (!) 169/90 (BP Location: Right  Arm)   Pulse 60   Temp 98.2 F (36.8 C) (Oral)   Resp 18   Ht 5\' 7"  (1.702 m)   Wt 61.7 kg   SpO2 98%   BMI 21.30 kg/m  Physical Exam Vitals and nursing note reviewed.  Constitutional:      Appearance: Normal appearance.  HENT:     Head: Normocephalic and atraumatic.     Right Ear: Tympanic membrane, ear canal and external ear normal. There is no impacted cerumen.     Left Ear: Tympanic membrane, ear canal and external ear normal. There is no impacted cerumen.     Nose: Nose normal. No congestion or rhinorrhea.     Mouth/Throat:     Mouth: Mucous  membranes are moist.     Pharynx: Oropharynx is clear. No oropharyngeal exudate or posterior oropharyngeal erythema.  Eyes:     General:        Right eye: No discharge.        Left eye: No discharge.     Extraocular Movements: Extraocular movements intact.     Conjunctiva/sclera: Conjunctivae normal.     Pupils: Pupils are equal, round, and reactive to light.  Cardiovascular:     Rate and Rhythm: Normal rate and regular rhythm.     Pulses: Normal pulses.     Heart sounds: Normal heart sounds. No murmur heard.    No friction rub. No gallop.  Pulmonary:     Effort: Pulmonary effort is normal. No respiratory distress.  Abdominal:     General: Abdomen is flat.     Palpations: Abdomen is soft.     Tenderness: There is no abdominal tenderness.  Musculoskeletal:     Cervical back: Normal range of motion and neck supple. No rigidity.  Skin:    General: Skin is warm and dry.  Neurological:     General: No focal deficit present.     Mental Status: She is alert and oriented to person, place, and time. Mental status is at baseline.     Motor: No weakness.     Gait: Gait normal.  Psychiatric:        Mood and Affect: Mood normal.     ED Results / Procedures / Treatments   Labs (all labs ordered are listed, but only abnormal results are displayed) Labs Reviewed  BASIC METABOLIC PANEL - Abnormal; Notable for the following components:      Result Value   Glucose, Bld 120 (*)    All other components within normal limits  URINALYSIS, ROUTINE W REFLEX MICROSCOPIC - Abnormal; Notable for the following components:   Color, Urine COLORLESS (*)    All other components within normal limits  CBG MONITORING, ED - Abnormal; Notable for the following components:   Glucose-Capillary 120 (*)    All other components within normal limits  CBC  CBG MONITORING, ED  TROPONIN I (HIGH SENSITIVITY)  TROPONIN I (HIGH SENSITIVITY)    EKG EKG Interpretation Date/Time:  Saturday May 10 2023  11:39:05 EST Ventricular Rate:  73 PR Interval:  156 QRS Duration:  72 QT Interval:  386 QTC Calculation: 425 R Axis:   67  Text Interpretation: Normal sinus rhythm Normal ECG No previous ECGs available Confirmed by Alvira Monday ([id number]) on 05/10/2023 1:01:37 PM  Radiology CT Head Wo Contrast  Result Date: 05/10/2023 CLINICAL DATA:  Dizziness for 6 months.  Syncopal episode Thursday. EXAM: CT HEAD WITHOUT CONTRAST TECHNIQUE: Contiguous axial images were obtained from the base of the skull  through the vertex without intravenous contrast. RADIATION DOSE REDUCTION: This exam was performed according to the departmental dose-optimization program which includes automated exposure control, adjustment of the mA and/or kV according to patient size and/or use of iterative reconstruction technique. COMPARISON:  None Available. FINDINGS: Brain: Left-sided physiologic basal ganglia calcifications. No mass lesion, hemorrhage, hydrocephalus, acute infarct, intra-axial, or extra-axial fluid collection. Vascular: No hyperdense vessel or unexpected calcification. Skull: Normal Sinuses/Orbits: Normal imaged portions of the orbits and globes. Hypoplastic frontal sinuses. Other paranasal sinuses and mastoid air cells clear. Other: None. IMPRESSION: Normal head CT. Electronically Signed   By: Jeronimo Greaves M.D.   On: 05/10/2023 13:47    Procedures Procedures    Medications Ordered in ED Medications - No data to display  ED Course/ Medical Decision Making/ A&P            HEART Score: 3                    Medical Decision Making Amount and/or Complexity of Data Reviewed Labs: ordered. Radiology: ordered.   This patient is a 77 year old female who presents to the ED for concern of dizziness with nausea and instability.   Differential diagnoses prior to evaluation: The emergent differential diagnosis includes, but is not limited to, CVA, BPPV, water intoxication, electrolyte abnormality, labyrinthitis,  ACS. This is not an exhaustive differential.   Past Medical History / Co-morbidities / Social History: Bronchiectasis using incentive spirometry  Additional history: Chart reviewed. Pertinent results include: Previously seen by neuro and ENT with benign findings.  Physical Exam: Physical exam performed. The pertinent findings include: Benign exam.  Patient's blood pressure is noted to be elevated.  However upon reevaluation patient's blood pressure began trending down as more results came back.  Lab Tests/Imaging studies: I personally interpreted labs/imaging and the pertinent results include: CT head without contrast which was unremarkable. I agree with the radiologist interpretation.  UA showed colorless urine indicative of overhydration    Cardiac monitoring: EKG obtained and interpreted by myself and attending physician which shows: NSR  ED Course:  Patient 77 year old female presents to the ED today after being seen by urgent care earlier for dizziness with nausea and instability.  Urgent care was worried after seeing a PVC on ECG and with prior event of near syncope on Thursday, he determined that she needed to be evaluated in the ER for possible cardiac or CVA event.  Patient has been experiencing the symptoms for over 6 months and has previously been seen by neurology and by ENT with unremarkable findings.  Upon reviewing her history she has a history of drinking close to 90 ounces or more water a day and takes many supplements .  And has symptoms that are helps with food intake.  Physical exam was benign and patient was able to walk without issue.  Blood pressure was elevated upon arrival but trended down during her course here upon reevaluation.  Upon labs and CT coming back as unremarkable, I believe this patient is safe to be discharged and follow-up outpatient.  She says she is going to try and remove 1 supplement at a time for a week and see if this changes her lightheadedness,  try and eat more meals scattered throughout the day, less than water intake.  She was told to follow-up with for further management and for further management of her blood pressure.  And was given strict return to ER precautions.   Disposition: After consideration of the diagnostic results  and the patients response to treatment, I feel that treatment noted as above and discharge.   emergency department workup does not suggest an emergent condition requiring admission or immediate intervention beyond what has been performed at this time. The plan is: Follow-up with PCP with symptomatic treatment. The patient is safe for discharge and has been instructed to return immediately for worsening symptoms, change in symptoms or any other concerns.   Final Clinical Impression(s) / ED Diagnoses Final diagnoses:  Dizziness    Rx / DC Orders ED Discharge Orders     None         Lunette Stands, PA-C 05/10/23 1826    Alvira Monday, MD 05/12/23 1131

## 2023-05-10 NOTE — Discharge Instructions (Addendum)
You were seen today for dizziness with nausea and instability.  Follow-up with PCP for further blood pressure medication management.  Try the things we discussed: Decreasing water intake by a little bit Remove 1 supplements for 1 week time and see response Try a electrolyte supplement in your water Try spacing out your meals to be more regular  If symptoms worsen, you develop weakness, fever, fatigue, confusion, shortness of breath, chest pain, fainting, seizure return to the ER for further evaluation.

## 2023-05-10 NOTE — ED Notes (Signed)
Pt walked to the restroom without assistance.

## 2023-05-10 NOTE — ED Triage Notes (Signed)
Pt via pov from home with dizziness x 6 months. She reports that she has seen neuro and other docs with no diagnosis. Today she went to [facility] for dizziness and balance issues; they told her she had abnormal EKG and to go to ED. Pt had syncopal episode on Thursday. A&O x 4; nad noted.

## 2023-05-12 DIAGNOSIS — M5011 Cervical disc disorder with radiculopathy,  high cervical region: Secondary | ICD-10-CM | POA: Diagnosis not present

## 2023-05-12 DIAGNOSIS — M9901 Segmental and somatic dysfunction of cervical region: Secondary | ICD-10-CM | POA: Diagnosis not present

## 2023-05-16 ENCOUNTER — Encounter: Payer: Self-pay | Admitting: Neurology

## 2023-05-19 DIAGNOSIS — H2513 Age-related nuclear cataract, bilateral: Secondary | ICD-10-CM | POA: Diagnosis not present

## 2023-05-26 DIAGNOSIS — N3000 Acute cystitis without hematuria: Secondary | ICD-10-CM | POA: Diagnosis not present

## 2023-05-26 DIAGNOSIS — R399 Unspecified symptoms and signs involving the genitourinary system: Secondary | ICD-10-CM | POA: Diagnosis not present

## 2023-06-11 ENCOUNTER — Telehealth: Payer: Self-pay | Admitting: Neurology

## 2023-06-11 ENCOUNTER — Encounter: Payer: Self-pay | Admitting: Neurology

## 2023-06-11 ENCOUNTER — Ambulatory Visit (INDEPENDENT_AMBULATORY_CARE_PROVIDER_SITE_OTHER): Payer: Medicare Other | Admitting: Neurology

## 2023-06-11 VITALS — BP 139/88 | HR 81 | Ht 67.0 in | Wt 141.0 lb

## 2023-06-11 DIAGNOSIS — F5104 Psychophysiologic insomnia: Secondary | ICD-10-CM

## 2023-06-11 DIAGNOSIS — F458 Other somatoform disorders: Secondary | ICD-10-CM

## 2023-06-11 DIAGNOSIS — R269 Unspecified abnormalities of gait and mobility: Secondary | ICD-10-CM | POA: Diagnosis not present

## 2023-06-11 DIAGNOSIS — R42 Dizziness and giddiness: Secondary | ICD-10-CM

## 2023-06-11 DIAGNOSIS — G2581 Restless legs syndrome: Secondary | ICD-10-CM

## 2023-06-11 MED ORDER — GABAPENTIN 100 MG PO CAPS
300.0000 mg | ORAL_CAPSULE | Freq: Every evening | ORAL | 6 refills | Status: DC | PRN
Start: 2023-06-11 — End: 2023-07-10

## 2023-06-11 NOTE — Telephone Encounter (Signed)
medicare/[employer] sup NPR sent to GI [phone] 

## 2023-06-11 NOTE — Progress Notes (Signed)
 Chief Complaint  Patient presents with   imbalance     RM 15 alone  Pt reports she is having sever nausea, imbalance and dizziness. She also reports having some tremor in her hands.    ASSESSMENT AND PLAN  Beverly [name] is a 78 y.o. female   Chronic insomnia Dizziness, imbalance gait   MRI of cervical spine November 2024 showed multilevel degenerative disc disease, no cord compression, variable degree of foraminal narrowing most severe at lower cervical region,  She tried trazodone  50 mg titrating to 100 mg every night not helping her sleep,  Current symptoms cause significant disruption of her day routine, desire further evaluation, proceed with MRI of the brain, CT angiogram of head and neck, Possible restless leg syndrome,   Laboratory evaluation including ferritin  Taper off trazodone   Gabapentin  100 mg titrating to 300 mg every night DIAGNOSTIC DATA (LABS, IMAGING, TESTING) - I reviewed patient records, labs, notes, testing and imaging myself where available.   MEDICAL HISTORY:  Beverly [name], is a 78 year old female seen in request by her primary care from [facility] Dr. Seabron Lenis, for evaluation of dizziness, imbalance sensation, initial evaluation March 25, 2023   History is obtained from the patient and review of electronic medical records. I personally reviewed pertinent available imaging films in PACS.   PMHx of  HLD Left ankle fracture Bilateral hip replacement  She used to be very active, walks 5 miles daily, but gradually slowed down since 2024, now barely 2 miles every other day, she has multiple arthritis, had bilateral hip replacement, pending bilateral patellar replacement, complains of knee and hip discomfort  She also complains of shortness of breath with exertion, under the care of pulmonologist,   Ct chest in Oct 2024: 1. Chronic areas of bronchiectasis in the anterior right upper lobe, right middle lobe and lingula with areas of volume loss  and peribronchial opacity, overall mild improvement from prior exam. Lesser areas of bronchiectasis and peribronchovascular nodularity within the lower lobes, with definite basilar improvement. 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 2. Small hiatal hernia. 3. Mild emphysema.  She has a lot of stress, difficulty falling to sleep, since summer 2024, she had rapid decline of her functional status, feeling tired, unsteady sensation, dizziness, mainly feel that when she walking, denies spinning sensation, denies bowel or bladder incontinence, denies significant neck pain, feel her body leaning towards the right side  Orthostatic blood pressure showed mild elevated blood pressure, but no significant orthostatic changes  UPDATE Jan 8th 2025: She continue complains of frequent dizziness, unsteadiness sensation, far from her baseline, during holiday season, she was able to drive to [place] , handled the travel well  She denies vertigo, more of unsteady veered sensation when ambulating, denies hearing loss  MRI of cervical spine in November 2024 mild degenerative changes, no evidence of the cord or nerve root compression CT head without contrast was normal Epley's maneuver today did not trigger vertigo or nystagmus, PHYSICAL EXAM:   Vitals:   06/11/23 1404  BP: 139/88  Pulse: 81  Weight: 141 lb (64 kg)  Height: 5' 7 (1.702 m)   03/25/2023 2:01 PM 03/25/2023 2:03 PM 03/25/2023 2:04 PM   Orthostatic BP 179/85 194/77 173/96  BP Location  Right Arm Right Arm  Patient Position  Sitting Standing  Pulse 77 78 80    Body mass index is 22.08 kg/m.  PHYSICAL EXAMNIATION:  Gen: NAD, conversant, well nourised, well groomed  Cardiovascular: Regular rate rhythm, no peripheral edema, warm, nontender. Eyes: Conjunctivae clear without exudates or hemorrhage Neck: Supple, no carotid bruits. Pulmonary: Clear to auscultation bilaterally   NEUROLOGICAL  EXAM:  MENTAL STATUS: Speech/cognition: Awake, alert, oriented to history taking and casual conversation CRANIAL NERVES: CN II: Visual fields are full to confrontation. Pupils are round equal and briskly reactive to light. CN III, IV, VI: extraocular movement are normal. No ptosis. CN V: Facial sensation is intact to light touch CN VII: Face is symmetric with normal eye closure  CN VIII: Hearing is normal to causal conversation. CN IX, X: Phonation is normal. CN XI: Head turning and shoulder shrug are intact  MOTOR: There is no pronator drift of out-stretched arms. Muscle bulk and tone are normal. Muscle strength is normal.  REFLEXES: Reflexes are 2+ and symmetric at the biceps, triceps   knees, and ankles. Plantar responses are answered bilaterally  SENSORY: Intact to light touch, pinprick and vibratory sensation are intact in fingers and toes.  COORDINATION: There is no trunk or limb dysmetria noted.  GAIT/STANCE: Need push-up to get up from seated position, cautious,  Epley's maneuver with right or left ear dependent position, there was no vertigo or nystagmus noted.  REVIEW OF SYSTEMS:  Full 14 system review of systems performed and notable only for as above All other review of systems were negative.   ALLERGIES: Allergies  Allergen Reactions   Macrodantin Hives and Rash    Other Reaction(s): hives, Other (See Comments), Unknown   Rosuvastatin Other (See Comments)    Other Reaction(s): myalgia/arthralgia, Myalgias   Wound Dressings Itching   Sulfa Antibiotics Palpitations, Swelling and Anxiety    Other Reaction(s): heart races, itchy    HOME MEDICATIONS: Current Outpatient Medications  Medication Sig Dispense Refill   bimatoprost  (LATISSE ) 0.03 % ophthalmic solution PLACE ONE DROP ON APPLICATOR AND APPLY EVENLY ALONG THE SKIN OF THE UPPER EYELID AT BASE OF EYELASHES ONCE DAILY AT BEDTIME TO BOTH EYES 3 mL 4   Calcium Carb-Cholecalciferol (CALCIUM 600 + D PO)  Take 1 tablet by mouth daily.     estradiol (VIVELLE-DOT) 0.025 MG/24HR Place 1 patch onto the skin 2 (two) times a week. ALORA  (Wednesdays & Saturdays)     Multiple Vitamins-Minerals (MULTIVITAMIN WITH MINERALS) tablet Take 1 tablet by mouth daily.     Oil of Oregano 1500 MG CAPS Take 1,500 mg by mouth daily.     OVER THE COUNTER MEDICATION Take 7-8 capsules by mouth at bedtime. [place] RHUBARB 400 mg/capsule     pantoprazole  (PROTONIX ) 40 MG tablet TAKE 1 TABLET BY MOUTH TWICE A DAY 180 tablet 1   pravastatin (PRAVACHOL) 10 MG tablet Take 10 mg by mouth once a week.     Probiotic Product (PROBIOTIC PO) Take 1 capsule by mouth daily. 5 Billion CFUs     progesterone (PROMETRIUM) 100 MG capsule Take 100 mg by mouth every Monday, Wednesday, and Friday.      psyllium (METAMUCIL) 58.6 % powder Take 1 packet by mouth 2 (two) times daily.     traZODone  (DESYREL ) 50 MG tablet Take 2 tablets (100 mg total) by mouth at bedtime. 60 tablet 11   vitamin B-12 (CYANOCOBALAMIN ) 1000 MCG tablet Take 1,000 mcg by mouth daily.     No current facility-administered medications for this visit.    PAST MEDICAL HISTORY: Past Medical History:  Diagnosis Date   Atypical mole 11/03/2013   R prox post lat thigh    Basal cell carcinoma  mid back   Basal cell carcinoma 04/01/2019   L chest parasternal    Basal cell carcinoma 09/11/2017   L mid back at braline 3.0 cm lat to spine   Emphysema lung (HCC)    Mild no symptoms Found on coronary CT scan   Esophageal dysmotility    GERD (gastroesophageal reflux disease)    occassional   OA (osteoarthritis)    hips and knees   Pneumonia    history of    PONV (postoperative nausea and vomiting)    Squamous cell carcinoma of skin 06/11/2021   L mid pretibia, [facility]    PAST SURGICAL HISTORY: Past Surgical History:  Procedure Laterality Date   COLONOSCOPY     FRACTURE SURGERY  2009   left ankle   INGUINAL HERNIA REPAIR Right 08/22/2021   Procedure: OPEN RIGHT  INGUINAL HERNIA REPAIR WITH MESH;  Surgeon: Signe Mitzie LABOR, MD;  Location: [facility];  Service: General;  Laterality: Right;   LAPAROSCOPY  1992   infection after tubal   SHOULDER OPEN ROTATOR CUFF REPAIR Right 2009   TOTAL HIP ARTHROPLASTY Left 01/29/2019   Procedure: LEFT TOTAL HIP ARTHROPLASTY ANTERIOR APPROACH;  Surgeon: Vernetta Lonni GRADE, MD;  Location: [facility];  Service: Orthopedics;  Laterality: Left;   TOTAL HIP ARTHROPLASTY Right 09/24/2019   Procedure: RIGHT TOTAL HIP ARTHROPLASTY ANTERIOR APPROACH;  Surgeon: Vernetta Lonni GRADE, MD;  Location: [facility];  Service: Orthopedics;  Laterality: Right;   TUBAL LIGATION  1992    FAMILY HISTORY: Family History  Problem Relation Age of Onset   Allergic rhinitis Neg Hx    Angioedema Neg Hx    Asthma Neg Hx    Atopy Neg Hx    Eczema Neg Hx    Immunodeficiency Neg Hx    Urticaria Neg Hx     SOCIAL HISTORY: Social History   Socioeconomic History   Marital status: Married    Spouse name: Not on file   Number of children: Not on file   Years of education: Not on file   Highest education level: Not on file  Occupational History   Not on file  Tobacco Use   Smoking status: Former    Current packs/day: 0.00    Types: Cigarettes    Quit date: 12/27/1979    Years since quitting: 43.4   Smokeless tobacco: Never  Vaping Use   Vaping status: Never Used  Substance and Sexual Activity   Alcohol use: Yes    Alcohol/week: 1.0 standard drink of alcohol    Types: 1 Glasses of wine per week    Comment: 1-2 a week   Drug use: No   Sexual activity: Not Currently    Birth control/protection: Post-menopausal  Other Topics Concern   Not on file  Social History Narrative   Not on file   Social Drivers of Health   Financial Resource Strain: Not on file  Food Insecurity: Not on file  Transportation Needs: Not on file  Physical Activity: Not on file  Stress: Not on file  Social Connections: Not on file  Intimate Partner  Violence: Not on file      Modena Callander, M.D. Ph.D.  [facility] [address], [address] [place], [place] [address] Ph: [phone] Fax: [phone]  CC:  Seabron Lenis, MD [address] [place],  [place] [address]  Seabron Lenis, MD

## 2023-06-12 LAB — RPR: RPR Ser Ql: NONREACTIVE

## 2023-06-12 LAB — C-REACTIVE PROTEIN: CRP: 4 mg/L (ref 0–10)

## 2023-06-12 LAB — VITAMIN B12: Vitamin B-12: 1647 pg/mL — ABNORMAL HIGH (ref 232–1245)

## 2023-06-12 LAB — SEDIMENTATION RATE: Sed Rate: 15 mm/h (ref 0–40)

## 2023-06-12 LAB — ANA W/REFLEX IF POSITIVE: Anti Nuclear Antibody (ANA): NEGATIVE

## 2023-06-12 LAB — TSH: TSH: 2.57 u[IU]/mL (ref 0.450–4.500)

## 2023-06-12 LAB — FERRITIN: Ferritin: 30 ng/mL (ref 15–150)

## 2023-06-16 ENCOUNTER — Other Ambulatory Visit: Payer: Self-pay

## 2023-06-16 ENCOUNTER — Ambulatory Visit: Payer: Medicare Other | Attending: Family Medicine | Admitting: Physical Therapy

## 2023-06-16 ENCOUNTER — Encounter: Payer: Self-pay | Admitting: Physical Therapy

## 2023-06-16 DIAGNOSIS — R42 Dizziness and giddiness: Secondary | ICD-10-CM | POA: Diagnosis not present

## 2023-06-16 DIAGNOSIS — R269 Unspecified abnormalities of gait and mobility: Secondary | ICD-10-CM | POA: Insufficient documentation

## 2023-06-16 DIAGNOSIS — F5104 Psychophysiologic insomnia: Secondary | ICD-10-CM | POA: Insufficient documentation

## 2023-06-16 NOTE — Therapy (Signed)
 OUTPATIENT PHYSICAL THERAPY VESTIBULAR EVALUATION     Patient Name: Beverly [name] MRN: [id number] DOB:[birth year], 78 y.o., female Today's Date: 06/16/2023  END OF SESSION:  PT End of Session - 06/16/23 1155     Visit Number 1    Number of Visits 7    Date for PT Re-Evaluation 07/28/23    Authorization Type Medicare/Aetna    PT Start Time 1109   pt late   PT Stop Time 1146    PT Time Calculation (min) 37 min    Activity Tolerance Patient tolerated treatment well    Behavior During Therapy [facility] for tasks assessed/performed             Past Medical History:  Diagnosis Date   Atypical mole 11/03/2013   R prox post lat thigh    Basal cell carcinoma    mid back   Basal cell carcinoma 04/01/2019   L chest parasternal    Basal cell carcinoma 09/11/2017   L mid back at braline 3.0 cm lat to spine   Emphysema lung (HCC)    Mild no symptoms Found on coronary CT scan   Esophageal dysmotility    GERD (gastroesophageal reflux disease)    occassional   OA (osteoarthritis)    hips and knees   Pneumonia    history of    PONV (postoperative nausea and vomiting)    Squamous cell carcinoma of skin 06/11/2021   L mid pretibia, [facility]   Past Surgical History:  Procedure Laterality Date   COLONOSCOPY     FRACTURE SURGERY  2009   left ankle   INGUINAL HERNIA REPAIR Right 08/22/2021   Procedure: OPEN RIGHT INGUINAL HERNIA REPAIR WITH MESH;  Surgeon: Signe Mitzie LABOR, MD;  Location: [facility];  Service: General;  Laterality: Right;   LAPAROSCOPY  1992   infection after tubal   SHOULDER OPEN ROTATOR CUFF REPAIR Right 2009   TOTAL HIP ARTHROPLASTY Left 01/29/2019   Procedure: LEFT TOTAL HIP ARTHROPLASTY ANTERIOR APPROACH;  Surgeon: Vernetta Lonni GRADE, MD;  Location: [facility];  Service: Orthopedics;  Laterality: Left;   TOTAL HIP ARTHROPLASTY Right 09/24/2019   Procedure: RIGHT TOTAL HIP ARTHROPLASTY ANTERIOR APPROACH;  Surgeon: Vernetta Lonni GRADE, MD;  Location: [facility];  Service:  Orthopedics;  Laterality: Right;   TUBAL LIGATION  1992   Patient Active Problem List   Diagnosis Date Noted   Restless leg 06/11/2023   Dizziness 03/25/2023   Gait abnormality 03/25/2023   Chronic insomnia 03/25/2023   Abnormal CT of the chest 02/08/2021   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   Primary osteoarthritis of left hip 12/07/2018   Primary osteoarthritis of right hip 12/07/2018   Pain of right hip joint 05/21/2017   Trochanteric bursitis, right hip 04/23/2017   Pain in right hip 04/23/2017   Hearing loss of right ear due to cerumen impaction 10/25/2016   Chronic rhinitis 07/04/2015   Cough 07/04/2015   Endometrial polyp 01/11/2011    PCP: Seabron Lenis, MD  REFERRING PROVIDER: Onita Duos, MD  REFERRING DIAG: R42 (ICD-10-CM) - Dizziness R26.9 (ICD-10-CM) - Gait abnormality F51.04 (ICD-10-CM) - Chronic insomnia  THERAPY DIAG:  Dizziness and giddiness  ONSET DATE: August 2024  Rationale for Evaluation and Treatment: Rehabilitation  SUBJECTIVE:   SUBJECTIVE STATEMENT: Patient reports that dizziness first started in August of 2024. Began suddenly with nausea but no vomiting. Symptoms depend on the day. Went to see the  ENT and ED and they found nothing wrong. Denies head trauma, infection/illness, vision changes/double vision, hearing loss, tinnitus, otalgia, migraines. Sees a chiropractor for her neck and gets adjustments. Reports that she infrequently has neck pain. Reports shakiness in her hands also since about August. Reports tendency to lean to the R. reports looking back and forth at shelves makes her dizzy. However, driving a car does not make her dizzy. Feels steadier when she is able to look down at the ground at her path. Denies dizziness with scrolling, motion on TV. Reports most times that she is dizzy she is standing or walking. Describes it as woozy, it's in my head and symptoms  are relatively mild. Feels better when eating.   Pt accompanied by: self  PERTINENT HISTORY: Emphysema, GERD, L ankle fx surgery, B THA, R RTC repair   PAIN:  Are you having pain? No  PRECAUTIONS: Fall  RED FLAGS: None   WEIGHT BEARING RESTRICTIONS: No  FALLS: Has patient fallen in last 6 months? No  LIVING ENVIRONMENT: Lives with: lives with their spouse Lives in: House/apartment; townhome Stairs:  3 steps to enter; 2 story home Has following equipment at home: Single point cane, Environmental Consultant - 2 wheeled, Crutches, and shower chair  PLOF: Independent; retired; enjoys hiking   PATIENT GOALS: improve balance and dizziness  OBJECTIVE:  Note: Objective measures were completed at Evaluation unless otherwise noted.  DIAGNOSTIC FINDINGS: 05/10/23: Normal head CT.   Per Dr. Jesus (ENT): Ears are healthy and clear. She definitely has some cerebellar signs possibly posterior column as well. There is no real treatment for this. She may benefit with some vestibular rehab. Will make a referral. Follow-up as needed.   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   COGNITION: Overall cognitive status: Within functional limits for tasks assessed   SENSATION: Denies N/T in UEs/LEs    COORDINATION: Very slight B hand tremor at rest Alternating pronation/supination: WNL B Alternating toe tap: WNL B Finger to nose: mild intention tremor and dysmetria B  POSTURE:  rounded shoulders and forward head  GAIT: Gait pattern: WNL, slightly slowed and wide BOS Assistive device utilized: None Level of assistance: Modified independence   PATIENT SURVEYS:  FOTO NT d/t time   VESTIBULAR ASSESSMENT:  GENERAL OBSERVATION: pt does  not wear glasses (readers only)  OCULOMOTOR EXAM:  Ocular Alignment: normal  Ocular ROM: No Limitations  Spontaneous Nystagmus: absent  Gaze-Induced Nystagmus: absent  Smooth Pursuits: intact  Saccades: intact; reports mild dizziness   Convergence/Divergence: 3 cm   VESTIBULAR - OCULAR REFLEX:   Slow VOR: Normal (audible cavitation without pain)  VOR Cancellation: Normal  Head-Impulse Test: HIT Right: slightly positive HIT Left: slightly positive     POSITIONAL TESTING: NT  TREATMENT DATE: 06/16/23  PATIENT EDUCATION: Education details: prognosis, POC, edu and handout on 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 Yes  SHORT TERM GOALS: Target date: 07/07/2023  Patient to be independent with initial HEP. Baseline: HEP initiated Goal status: INITIAL    LONG TERM GOALS: Target date: 07/28/2023  Patient to be independent with advanced HEP. Baseline: Not yet initiated  Goal status: INITIAL  Patient to report 50% improvement in symptoms.  Baseline: - Goal status: INITIAL  Patient will report tolerance for consistent walking/hiking program without symptoms limiting.  Baseline: reports only walking 2x/week max Goal status: INITIAL  Patient to demonstrate mild-moderate sway with M-CTSIB condition with eyes closed/foam surface in order to improve safety in environments with uneven surfaces and dim lighting. Baseline: NT Goal status: INITIAL  Patient to score at least 22/30 on FGA in order to decrease risk of falls. Baseline: NT Goal status: INITIAL   ASSESSMENT:  CLINICAL IMPRESSION: Patient is a 78 y.o. F who was seen today for physical therapy evaluation and  treatment for dizziness since August 2024. Seen by Dr. Jesus (ENT) who suggested cerebellar cause, however imaging thus far has been clear. Denies head trauma, infection/illness, vision changes/double vision, hearing loss, tinnitus, otalgia, migraines. Patient today with Mild B intention tremor, minor gait deviations, dizziness with saccades, and slightly positive B HIT. 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 Patient also with slightly abnormal coordination testing and is currently undergoing further imaging workup.  Would benefit from skilled PT services 1-2x/week for 6 weeks to address aforementioned impairments.   OBJECTIVE IMPAIRMENTS: Abnormal gait, decreased activity tolerance, decreased balance, and dizziness.   ACTIVITY LIMITATIONS: standing, stairs, bathing, self feeding, and locomotion level  PARTICIPATION LIMITATIONS: meal prep, cleaning, laundry, shopping, community activity, yard work, and church  PERSONAL FACTORS: Age, Past/current experiences, Time since onset of injury/illness/exacerbation, and 3+ comorbidities: Emphysema, GERD, L ankle fx surgery, B THA, R RTC repair   are also affecting patient's functional outcome.   REHAB POTENTIAL: Good  CLINICAL DECISION MAKING: Evolving/moderate complexity  EVALUATION COMPLEXITY: Moderate   PLAN:  PT FREQUENCY: 1-2x/week  PT DURATION: 6 weeks  PLANNED INTERVENTIONS: 97164- PT Re-evaluation, 97110-Therapeutic exercises, 97530- Therapeutic activity, 97112- Neuromuscular re-education, 97535- Self Care, 02859- Manual therapy, [phone]- Gait training, [phone]- Canalith repositioning, 97014- Electrical stimulation (unattended), Patient/Family education, Balance training, Stair training, Taping, Dry Needling, Joint mobilization, Spinal mobilization, Vestibular training, DME instructions, Cryotherapy, and Moist heat  PLAN FOR NEXT  SESSION: complete positional testing, FGA, MCTSIB, initiate HEP    Louana Terrilyn Christians, PT, DPT 06/16/23 12:49 PM  [facility] at [facility] [address] [place], [place] [address] Phone # [phone] Fax # [phone]

## 2023-06-17 DIAGNOSIS — M9901 Segmental and somatic dysfunction of cervical region: Secondary | ICD-10-CM | POA: Diagnosis not present

## 2023-06-17 DIAGNOSIS — M5011 Cervical disc disorder with radiculopathy,  high cervical region: Secondary | ICD-10-CM | POA: Diagnosis not present

## 2023-06-17 NOTE — Therapy (Addendum)
 OUTPATIENT PHYSICAL THERAPY VESTIBULAR TREATMENT     Patient Name: Beverly [name] MRN: [id number] DOB:1945/11/13, 78 y.o., female Today's Date: 06/18/2023  END OF SESSION:  PT End of Session - 06/18/23 1624     Visit Number 2    Number of Visits 7    Date for PT Re-Evaluation 07/28/23    Authorization Type Medicare/Aetna    PT Start Time 1532    PT Stop Time 1622    PT Time Calculation (min) 50 min    Equipment Utilized During Treatment Gait belt    Activity Tolerance Patient tolerated treatment well    Behavior During Therapy WFL for tasks assessed/performed              Past Medical History:  Diagnosis Date   Atypical mole 11/03/2013   R prox post lat thigh    Basal cell carcinoma    mid back   Basal cell carcinoma 04/01/2019   L chest parasternal    Basal cell carcinoma 09/11/2017   L mid back at braline 3.0 cm lat to spine   Emphysema lung (HCC)    Mild no symptoms Found on coronary CT scan   Esophageal dysmotility    GERD (gastroesophageal reflux disease)    occassional   OA (osteoarthritis)    hips and knees   Pneumonia    history of    PONV (postoperative nausea and vomiting)    Squamous cell carcinoma of skin 06/11/2021   L mid pretibia, [facility]   Past Surgical History:  Procedure Laterality Date   COLONOSCOPY     FRACTURE SURGERY  2009   left ankle   INGUINAL HERNIA REPAIR Right 08/22/2021   Procedure: OPEN RIGHT INGUINAL HERNIA REPAIR WITH MESH;  Surgeon: Adalberto Acton, MD;  Location: [facility];  Service: General;  Laterality: Right;   LAPAROSCOPY  1992   infection after tubal   SHOULDER OPEN ROTATOR CUFF REPAIR Right 2009   TOTAL HIP ARTHROPLASTY Left 01/29/2019   Procedure: LEFT TOTAL HIP ARTHROPLASTY ANTERIOR APPROACH;  Surgeon: Arnie Lao, MD;  Location: [facility];  Service: Orthopedics;  Laterality: Left;   TOTAL HIP ARTHROPLASTY Right 09/24/2019   Procedure: RIGHT TOTAL HIP ARTHROPLASTY ANTERIOR APPROACH;  Surgeon: Arnie Lao, MD;  Location: [facility];  Service: Orthopedics;  Laterality: Right;   TUBAL LIGATION  1992   Patient Active Problem List   Diagnosis Date Noted   Restless leg 06/11/2023   Dizziness 03/25/2023   Gait abnormality 03/25/2023   Chronic insomnia 03/25/2023   Abnormal CT of the chest 02/08/2021   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   Primary osteoarthritis of left hip 12/07/2018   Primary osteoarthritis of right hip 12/07/2018   Pain of right hip joint 05/21/2017   Trochanteric bursitis, right hip 04/23/2017   Pain in right hip 04/23/2017   Hearing loss of right ear due to cerumen impaction 10/25/2016   Chronic rhinitis 07/04/2015   Cough 07/04/2015   Endometrial polyp 01/11/2011    PCP: Rae Bugler, MD  REFERRING PROVIDER: Phebe Brasil, MD  REFERRING DIAG: R42 (ICD-10-CM) - Dizziness R26.9 (ICD-10-CM) - Gait abnormality F51.04 (ICD-10-CM) - Chronic insomnia  THERAPY DIAG:  Dizziness and giddiness  ONSET DATE: August 2024  Rationale for Evaluation and Treatment: Rehabilitation  SUBJECTIVE:   SUBJECTIVE STATEMENT: Today has been a little worse with balance. Reports that the 3PD handout given to her "really hits the  nail on the head."    Pt accompanied by: self  PERTINENT HISTORY: Emphysema, GERD, L ankle fx surgery, B THA, R RTC repair   PAIN:  Are you having pain? No  PRECAUTIONS: Fall  RED FLAGS: None   WEIGHT BEARING RESTRICTIONS: No  FALLS: Has patient fallen in last 6 months? No  LIVING ENVIRONMENT: Lives with: lives with their spouse Lives in: House/apartment; townhome Stairs:  3 steps to enter; 2 story home Has following equipment at home: Single point cane, Environmental consultant - 2 wheeled, Crutches, and shower chair  PLOF: Independent; retired; enjoys hiking   PATIENT GOALS: improve balance and dizziness  OBJECTIVE:      TODAY'S TREATMENT: 06/18/23 Activity Comments   [place] 3PD questionnaire  13/72  R DH Negative; Mild-moderate dizziness upon sitting up  L DH Negative; Moderate dizziness upon sitting up   R/L roll test Negative   R sidelying test  negative  L sidelying test  Negative "extremely mild if anything"  FGA 23/30        M-CTSIB  Condition 1: Firm Surface, EO 30 Sec, Normal Sway  Condition 2: Firm Surface, EC 30 Sec, Mild Sway  Condition 3: Foam Surface, EO 30 Sec, Mild and Moderate Sway  Condition 4: Foam Surface, EC 5 Sec, Severe and R LOB  Sway     [facility] PT Assessment - 06/18/23 0001       Functional Gait  Assessment   Gait assessed  Yes    Gait Level Surface Walks 20 ft in less than 5.5 sec, no assistive devices, good speed, no evidence for imbalance, normal gait pattern, deviates no more than 6 in outside of the 12 in walkway width.    Change in Gait Speed Able to smoothly change walking speed without loss of balance or gait deviation. Deviate no more than 6 in outside of the 12 in walkway width.    Gait with Horizontal Head Turns Performs head turns smoothly with slight change in gait velocity (eg, minor disruption to smooth gait path), deviates 6-10 in outside 12 in walkway width, or uses an assistive device.    Gait with Vertical Head Turns Performs task with slight change in gait velocity (eg, minor disruption to smooth gait path), deviates 6 - 10 in outside 12 in walkway width or uses assistive device    Gait and Pivot Turn Pivot turns safely in greater than 3 sec and stops with no loss of balance, or pivot turns safely within 3 sec and stops with mild imbalance, requires small steps to catch balance.    Step Over Obstacle Is able to step over 2 stacked shoe boxes taped together (9 in total height) without changing gait speed. No evidence of imbalance.    Gait with Narrow Base of Support Ambulates 4-7 steps.    Gait with Eyes Closed Walks 20 ft, no assistive devices, good speed, no evidence of imbalance, normal gait pattern,  deviates no more than 6 in outside 12 in walkway width. Ambulates 20 ft in less than 7 sec.    Ambulating Backwards Walks 20 ft, uses assistive device, slower speed, mild gait deviations, deviates 6-10 in outside 12 in walkway width.    Steps Alternating feet, must use rail.    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  DIAGNOSTIC FINDINGS: 05/10/23: Normal head CT.   Per Dr. Donalee Fruits (ENT): Ears are healthy and clear. She definitely has some cerebellar signs possibly posterior column as well. There is no real treatment for this. She may benefit with some vestibular rehab. Will make a referral. Follow-up as needed.   04/06/23: MRI scan of cervical spine without contrast showing prominent spondylitic changes from C3-C7 most prominent at C5-6 where there is broad-based disc osteophyte protrusion with mild canal and left greater than right severe foraminal  narrowing possible impingement of the exiting nerve root on the left.   COGNITION: Overall cognitive status: Within functional limits for tasks assessed   SENSATION: Denies N/T in UEs/LEs    COORDINATION: Very slight B hand tremor at rest Alternating pronation/supination: WNL B Alternating toe tap: WNL B Finger to nose: mild intention tremor and dysmetria B  POSTURE:  rounded shoulders and forward head  GAIT: Gait pattern: WNL, slightly slowed and wide BOS Assistive device utilized: None Level of assistance: Modified independence   PATIENT SURVEYS:  FOTO NT d/t time   VESTIBULAR ASSESSMENT:  GENERAL OBSERVATION: pt does not wear glasses (readers only)  OCULOMOTOR EXAM:  Ocular Alignment: normal  Ocular ROM: No Limitations  Spontaneous Nystagmus: absent  Gaze-Induced Nystagmus: absent  Smooth Pursuits: intact  Saccades: intact; reports mild dizziness   Convergence/Divergence: 3 cm   VESTIBULAR - OCULAR REFLEX:   Slow VOR: Normal (audible cavitation without pain)  VOR Cancellation: Normal  Head-Impulse Test: HIT Right: slightly positive HIT Left: slightly positive       POSITIONAL TESTING: NT                                                                                                                            TREATMENT DATE: 06/16/23  PATIENT EDUCATION: Education details: prognosis, POC, edu and handout on 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 Yes  SHORT TERM GOALS: Target date: 07/07/2023  Patient to be independent with initial HEP. Baseline: HEP initiated Goal status: IN PROGRESS    LONG TERM GOALS: Target date: 07/28/2023  Patient to be independent with advanced HEP. Baseline: Not yet initiated  Goal status: IN PROGRESS  Patient to report 50%  improvement in symptoms.  Baseline: - Goal status: IN PROGRESS  Patient will report tolerance for consistent walking/hiking program without symptoms limiting.  Baseline: reports only walking 2x/week max Goal status: IN PROGRESS  Patient to demonstrate mild-moderate sway with M-CTSIB condition with eyes closed/foam surface in order to improve safety in environments with uneven surfaces and dim lighting. Baseline: NT Goal status: IN PROGRESS  Patient to score at least 22/30 on FGA in order to decrease risk of falls. Baseline: NT Goal status: IN PROGRESS   ASSESSMENT:  CLINICAL IMPRESSION:  Patient arrived to session without new complaints. 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 Positional testing was negative, however patient has some motion sensitivity sitting up from L side. Balance testing revealed decreased risk of falls but still showed room for progress. Multisensory balance testing revealed decreased use of vestibular system for balance. Provided HEP to address patient's impairments. Will make Neurologist aware of possible 3PD diagnosis to get her input. Patient agreeable with this.   OBJECTIVE IMPAIRMENTS: Abnormal gait, decreased activity tolerance, decreased balance, and dizziness.   ACTIVITY LIMITATIONS: standing, stairs, bathing, self feeding, and locomotion level  PARTICIPATION LIMITATIONS: meal prep, cleaning, laundry, shopping, community activity, yard work, and church  PERSONAL FACTORS: Age, Past/current experiences, Time since onset of injury/illness/exacerbation, and 3+ comorbidities: Emphysema, GERD, L ankle fx surgery, B THA, R RTC repair   are also affecting patient's functional outcome.   REHAB POTENTIAL: Good  CLINICAL DECISION MAKING: Evolving/moderate complexity  EVALUATION COMPLEXITY: Moderate   PLAN:  PT FREQUENCY: 1-2x/week  PT DURATION: 6 weeks  PLANNED INTERVENTIONS: 97164- PT Re-evaluation,  97110-Therapeutic exercises, 97530- Therapeutic activity, 97112- Neuromuscular re-education, 97535- Self Care, 98119- Manual therapy, [phone]- Gait training, [phone]- Canalith repositioning, 97014- Electrical stimulation (unattended), Patient/Family education, Balance training, Stair training, Taping, Dry Needling, Joint mobilization, Spinal mobilization, Vestibular training, DME instructions, Cryotherapy, and Moist heat  PLAN FOR NEXT SESSION: review HEP, balance with head turns, multisensory balance, try optokinetic challenges     Thaddeus Filippo, PT, DPT 06/18/23 4:24 PM  [facility] at [facility] [address], [address] [place], [place] [address] Phone # [phone] Fax # [phone]

## 2023-06-18 ENCOUNTER — Telehealth: Payer: Self-pay | Admitting: Physical Therapy

## 2023-06-18 ENCOUNTER — Ambulatory Visit: Payer: Medicare Other | Admitting: Physical Therapy

## 2023-06-18 ENCOUNTER — Encounter: Payer: Self-pay | Admitting: Physical Therapy

## 2023-06-18 DIAGNOSIS — R42 Dizziness and giddiness: Secondary | ICD-10-CM

## 2023-06-18 DIAGNOSIS — F5104 Psychophysiologic insomnia: Secondary | ICD-10-CM | POA: Diagnosis not present

## 2023-06-18 DIAGNOSIS — R269 Unspecified abnormalities of gait and mobility: Secondary | ICD-10-CM | POA: Diagnosis not present

## 2023-06-18 NOTE — Patient Instructions (Signed)
 Criteria for the diagnosis of Persistent Postural-Perceptual Dizziness (PPPD) PPPD is a chronic vestibular disorder defined by criteria A-E below. All five criteria must be fulfilled to make the diagnosis.  A.One or more symptoms of dizziness, unsteadiness, or non-spinning vertigo are present on most days for 3 months or more.1 - 3  1.Symptoms last for prolonged (hours-long) periods of time, but may wax and wane in severity.  2.Symptoms need not be present continuously throughout the entire day.  B.Persistent symptoms occur without specific provocation, but are exacerbated by three factors: 4,5  1.Upright posture,  2.Active or passive motion without regard to direction or position, and  3.Exposure to moving visual stimuli or complex visual patterns.  C.The disorder is precipitated by conditions that cause vertigo, unsteadiness, dizziness, or problems with balance including acute, episodic, or chronic vestibular syndromes, other neurologic or medical illnesses, or psychological distress.6  1.When the precipitant is an acute or episodic condition, symptoms settle into the pattern of criterion A as the precipitant resolves, but they may occur intermittently at first, and then consolidate into a persistent course.  2.When the precipitant is a chronic syndrome, symptoms may develop slowly at first and worsen gradually.  D.Symptoms cause significant distress or functional impairment.  E.Symptoms are not better accounted for by another disease or disorder.

## 2023-06-18 NOTE — Telephone Encounter (Signed)
 Hi Dr. Gracie Lav,  I am seeing Beverly [name] in OPPT for dizziness per your referral. 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 I wanted to get your input as the current literature from the [facility] recommends vestibular rehab, CBT, and SSRI's in conjunction for treatment of this condition. See below the diagnostic criteria for this condition. Please let me know what you think.  Thanks!   Thaddeus Filippo, PT, DPT 06/18/23 4:33 PM  [facility] [address], [address] [place], [place] [address] Phone # [phone] Fax # [phone]    Criteria for the diagnosis of Persistent Postural-Perceptual Dizziness (PPPD) PPPD is a chronic vestibular disorder defined by criteria A-E below. All five criteria must be fulfilled to make the diagnosis.   A.One or more symptoms of dizziness, unsteadiness, or non-spinning vertigo are present on most days for 3 months or more.1 - 3   1.Symptoms last for prolonged (hours-long) periods of time, but may wax and wane in severity.   2.Symptoms need not be present continuously throughout the entire day.   B.Persistent symptoms occur without specific provocation, but are exacerbated by three factors: 4,5   1.Upright posture,   2.Active or passive motion without regard to direction or position, and   3.Exposure to moving visual stimuli or complex visual patterns.   C.The disorder is precipitated by conditions that cause vertigo, unsteadiness, dizziness, or problems with balance including acute, episodic, or chronic vestibular syndromes, other neurologic or medical illnesses, or psychological distress.6   1.When the precipitant is an acute or episodic condition, symptoms settle into the pattern of criterion A as the precipitant resolves, but they may occur intermittently at first, and then consolidate into a persistent  course.   2.When the precipitant is a chronic syndrome, symptoms may develop slowly at first and worsen gradually.   D.Symptoms cause significant distress or functional impairment.   E.Symptoms are not better accounted for by another disease or disorder.

## 2023-06-19 NOTE — Telephone Encounter (Signed)
Thank you for your feedback, it is really helpful.   If patient wish a trial of SSRI, may advise her to contact my office or prefer her pcp for discussion.

## 2023-06-20 ENCOUNTER — Encounter: Payer: Self-pay | Admitting: Neurology

## 2023-06-25 ENCOUNTER — Ambulatory Visit: Payer: Medicare Other | Admitting: Physical Therapy

## 2023-06-28 ENCOUNTER — Other Ambulatory Visit: Payer: Medicare Other

## 2023-07-03 DIAGNOSIS — M5011 Cervical disc disorder with radiculopathy,  high cervical region: Secondary | ICD-10-CM | POA: Diagnosis not present

## 2023-07-03 DIAGNOSIS — M9901 Segmental and somatic dysfunction of cervical region: Secondary | ICD-10-CM | POA: Diagnosis not present

## 2023-07-07 ENCOUNTER — Ambulatory Visit: Payer: Medicare Other | Attending: Family Medicine | Admitting: Physical Therapy

## 2023-07-07 ENCOUNTER — Encounter: Payer: Self-pay | Admitting: Physical Therapy

## 2023-07-07 DIAGNOSIS — R42 Dizziness and giddiness: Secondary | ICD-10-CM | POA: Diagnosis not present

## 2023-07-07 NOTE — Therapy (Signed)
OUTPATIENT PHYSICAL THERAPY VESTIBULAR TREATMENT     Patient Name: Beverly [name] MRN: [id number] DOB:[birth year], 78 y.o., female Today's Date: 07/07/2023  END OF SESSION:  PT End of Session - 07/07/23 1145     Visit Number 3    Number of Visits 7    Date for PT Re-Evaluation 07/28/23    Authorization Type Medicare/[employer]    PT Start Time 1103    PT Stop Time 1146    PT Time Calculation (min) 43 min    Equipment Utilized During Treatment Gait belt    Activity Tolerance Patient tolerated treatment well    Behavior During Therapy WFL for tasks assessed/performed               Past Medical History:  Diagnosis Date   Atypical mole 11/03/2013   R prox post lat thigh    Basal cell carcinoma    mid back   Basal cell carcinoma 04/01/2019   L chest parasternal    Basal cell carcinoma 09/11/2017   L mid back at braline 3.0 cm lat to spine   Emphysema lung (HCC)    Mild no symptoms Found on coronary CT scan   Esophageal dysmotility    GERD (gastroesophageal reflux disease)    occassional   OA (osteoarthritis)    hips and knees   Pneumonia    history of    PONV (postoperative nausea and vomiting)    Squamous cell carcinoma of skin 06/11/2021   L mid pretibia, [facility]   Past Surgical History:  Procedure Laterality Date   COLONOSCOPY     FRACTURE SURGERY  2009   left ankle   INGUINAL HERNIA REPAIR Right 08/22/2021   Procedure: OPEN RIGHT INGUINAL HERNIA REPAIR WITH MESH;  Surgeon: Berna Bue, MD;  Location: [facility];  Service: General;  Laterality: Right;   LAPAROSCOPY  1992   infection after tubal   SHOULDER OPEN ROTATOR CUFF REPAIR Right 2009   TOTAL HIP ARTHROPLASTY Left 01/29/2019   Procedure: LEFT TOTAL HIP ARTHROPLASTY ANTERIOR APPROACH;  Surgeon: Kathryne Hitch, MD;  Location: [facility];  Service: Orthopedics;  Laterality: Left;   TOTAL HIP ARTHROPLASTY Right 09/24/2019   Procedure: RIGHT TOTAL HIP ARTHROPLASTY ANTERIOR APPROACH;  Surgeon: Kathryne Hitch, MD;  Location: [facility];  Service: Orthopedics;  Laterality: Right;   TUBAL LIGATION  1992   Patient Active Problem List   Diagnosis Date Noted   Restless leg 06/11/2023   Dizziness 03/25/2023   Gait abnormality 03/25/2023   Chronic insomnia 03/25/2023   Abnormal CT of the chest 02/08/2021   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   Primary osteoarthritis of left hip 12/07/2018   Primary osteoarthritis of right hip 12/07/2018   Pain of right hip joint 05/21/2017   Trochanteric bursitis, right hip 04/23/2017   Pain in right hip 04/23/2017   Hearing loss of right ear due to cerumen impaction 10/25/2016   Chronic rhinitis 07/04/2015   Cough 07/04/2015   Endometrial polyp 01/11/2011    PCP: Tally Joe, MD  REFERRING PROVIDER: Levert Feinstein, MD  REFERRING DIAG: R42 (ICD-10-CM) - Dizziness R26.9 (ICD-10-CM) - Gait abnormality F51.04 (ICD-10-CM) - Chronic insomnia  THERAPY DIAG:  Dizziness and giddiness  ONSET DATE: August 2024  Rationale for Evaluation and Treatment: Rehabilitation  SUBJECTIVE:   SUBJECTIVE STATEMENT: Today is more of a dizzy day. Sighting some nausea which hadn't been a problem lately. Reports that  she had had some issues with dizziness with reading recently and wonders if her sx have to do anything with her eyes.    Pt accompanied by: self  PERTINENT HISTORY: Emphysema, GERD, L ankle fx surgery, B THA, R RTC repair   PAIN:  Are you having pain? No  PRECAUTIONS: Fall  RED FLAGS: None   WEIGHT BEARING RESTRICTIONS: No  FALLS: Has patient fallen in last 6 months? No  LIVING ENVIRONMENT: Lives with: lives with their spouse Lives in: House/apartment; townhome Stairs:  3 steps to enter; 2 story home Has following equipment at home: Single point cane, Environmental consultant - 2 wheeled, Crutches, and shower chair  PLOF: Independent; retired; enjoys hiking   PATIENT GOALS:  improve balance and dizziness  OBJECTIVE:     TODAY'S TREATMENT: 07/07/23 Activity Comments  review of HEP: brandt daroff 1x each, then again with wedge under pillow  tandem walk  romberg + head turns 2x30" Required clarification on head turns activities. Reports of mild dizziness upon sitting up with BD. No increase with head turns   romberg + EC head turns 30" Report of unsteadiness  Backwards Tandem  At counter; tendency for L LOB  Sitting and standing saccades and smooth pursuit No sx except mild symptoms when standing with horizontal smooth pursuits        PATIENT EDUCATION: Education details: edu on modifications to brandt daroff- add pillows under head and perform slower or fewer reps on days sx are more elevated; patient requesting name of counselor- provided Forde Radon as possible provider, HEP update  Person educated: Patient Education method: Explanation, Demonstration, Tactile cues, Verbal cues, and Handouts Education comprehension: verbalized understanding and returned demonstration    HOME EXERCISE PROGRAM Access Code: [email or web address] Access Code: [email or web address] URL: [email or web address] Date: 07/07/2023 Prepared by: [facility] - [facility]  Exercises - Brandt-Daroff Vestibular Exercise  - 1 x daily - 5 x weekly - 2 sets - 3-5 reps - Tandem Walking with Counter Support  - 1 x daily - 5 x weekly - 2 sets - 5 reps - Backward Tandem Walking with Counter Support  - 1 x daily - 5 x weekly - 2 sets - 5 reps - Romberg Stance with Head Rotation  - 1 x daily - 5 x weekly - 2 sets - 30 sec hold - Standing Feet Together Smooth Pursuit  - 1 x daily - 5 x weekly - 2 sets - 10 reps     Note: Objective measures were completed at Evaluation unless otherwise noted.  DIAGNOSTIC FINDINGS: 05/10/23: Normal head CT.   Per Dr. Pollyann Kennedy (ENT): Ears are healthy and clear. She definitely has some cerebellar signs possibly posterior column as well. There is  no real treatment for this. She may benefit with some vestibular rehab. Will make a referral. Follow-up as needed.   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   COGNITION: Overall cognitive status: Within functional limits for tasks assessed   SENSATION: Denies N/T in UEs/LEs    COORDINATION: Very slight B hand tremor at rest Alternating pronation/supination: WNL B Alternating toe tap: WNL B Finger to nose: mild intention tremor and dysmetria B  POSTURE:  rounded shoulders and forward head  GAIT: Gait pattern: WNL, slightly slowed and  wide BOS Assistive device utilized: None Level of assistance: Modified independence   PATIENT SURVEYS:  FOTO NT d/t time   VESTIBULAR ASSESSMENT:  GENERAL OBSERVATION: pt does not wear glasses (readers only)  OCULOMOTOR EXAM:  Ocular Alignment: normal  Ocular ROM: No Limitations  Spontaneous Nystagmus: absent  Gaze-Induced Nystagmus: absent  Smooth Pursuits: intact  Saccades: intact; reports mild dizziness   Convergence/Divergence: 3 cm   VESTIBULAR - OCULAR REFLEX:   Slow VOR: Normal (audible cavitation without pain)  VOR Cancellation: Normal  Head-Impulse Test: HIT Right: slightly positive HIT Left: slightly positive       POSITIONAL TESTING: NT                                                                                                                            TREATMENT DATE: 06/16/23  PATIENT EDUCATION: Education details: prognosis, POC, edu and handout on 3PD Person educated: Patient Education method: Explanation, Demonstration, Tactile cues, Verbal cues, and Handouts Education comprehension: verbalized understanding  HOME EXERCISE  PROGRAM:  GOALS: Goals reviewed with patient? Yes  SHORT TERM GOALS: Target date: 07/07/2023  Patient to be independent with initial HEP. Baseline: HEP initiated Goal status: IN PROGRESS    LONG TERM GOALS: Target date: 07/28/2023  Patient to be independent with advanced HEP. Baseline: Not yet initiated  Goal status: IN PROGRESS  Patient to report 50% improvement in symptoms.  Baseline: - Goal status: IN PROGRESS  Patient will report tolerance for consistent walking/hiking program without symptoms limiting.  Baseline: reports only walking 2x/week max Goal status: IN PROGRESS  Patient to demonstrate mild-moderate sway with M-CTSIB condition with eyes closed/foam surface in order to improve safety in environments with uneven surfaces and dim lighting. Baseline: NT Goal status: IN PROGRESS  Patient to score at least 22/30 on FGA in order to decrease risk of falls. Baseline: NT Goal status: IN PROGRESS   ASSESSMENT:  CLINICAL IMPRESSION: Patient arrived to session with report of some increased dizziness and nausea today. Reports that reading is contributing to her sx. Provided education on modifications to her HEP and answered pt's questions. Worked on standing smooth pursuits and progression on balance activities. Patient tolerated session well and without complaints upon leaving.    OBJECTIVE IMPAIRMENTS: Abnormal gait, decreased activity tolerance, decreased balance, and dizziness.   ACTIVITY LIMITATIONS: standing, stairs, bathing, self feeding, and locomotion level  PARTICIPATION LIMITATIONS: meal prep, cleaning, laundry, shopping, community activity, yard work, and church  PERSONAL FACTORS: Age, Past/current experiences, Time since onset of injury/illness/exacerbation, and 3+ comorbidities: Emphysema, GERD, L ankle fx surgery, B THA, R RTC repair   are also affecting patient's functional outcome.   REHAB POTENTIAL: Good  CLINICAL DECISION MAKING: Evolving/moderate  complexity  EVALUATION COMPLEXITY: Moderate   PLAN:  PT FREQUENCY: 1-2x/week  PT DURATION: 6 weeks  PLANNED INTERVENTIONS: 97164- PT Re-evaluation, 97110-Therapeutic exercises, 97530- Therapeutic activity, [id number]- Neuromuscular re-education, 97535- Self Care, 69629- Manual therapy, [id number]- Gait training, [id number]- Canalith repositioning, 97014-  Electrical stimulation (unattended), Patient/Family education, Balance training, Stair training, Taping, Dry Needling, Joint mobilization, Spinal mobilization, Vestibular training, DME instructions, Cryotherapy, and Moist heat  PLAN FOR NEXT SESSION: review HEP, balance with head turns, multisensory balance, try optokinetic challenges     Baldemar Friday, PT, DPT 07/07/23 11:56 AM  [facility] at [facility] [address], [address] [place], [place] [address] Phone # [phone] Fax # [phone]

## 2023-07-08 ENCOUNTER — Encounter (INDEPENDENT_AMBULATORY_CARE_PROVIDER_SITE_OTHER): Payer: Medicare Other | Admitting: Neurology

## 2023-07-08 ENCOUNTER — Other Ambulatory Visit: Payer: Self-pay | Admitting: Dermatology

## 2023-07-08 DIAGNOSIS — R42 Dizziness and giddiness: Secondary | ICD-10-CM

## 2023-07-08 DIAGNOSIS — R21 Rash and other nonspecific skin eruption: Secondary | ICD-10-CM

## 2023-07-08 DIAGNOSIS — F5104 Psychophysiologic insomnia: Secondary | ICD-10-CM

## 2023-07-08 DIAGNOSIS — R269 Unspecified abnormalities of gait and mobility: Secondary | ICD-10-CM

## 2023-07-10 DIAGNOSIS — M5011 Cervical disc disorder with radiculopathy,  high cervical region: Secondary | ICD-10-CM | POA: Diagnosis not present

## 2023-07-10 DIAGNOSIS — M9901 Segmental and somatic dysfunction of cervical region: Secondary | ICD-10-CM | POA: Diagnosis not present

## 2023-07-10 MED ORDER — GABAPENTIN 300 MG PO CAPS: 900.0000 mg | ORAL_CAPSULE | Freq: Every day | ORAL | 11 refills | Status: DC

## 2023-07-10 NOTE — Telephone Encounter (Signed)
 I was able to call patient, gabapentin  300 mg every night has relaxed her, no longer have urgency to move her leg, but did not help her difficulty falling to sleep, previously tried trazodone  up to 100 mg every night did not help either  She denies significant depression, anxiety, is going through physical therapy for her complaints of unsteady gait, dizziness, which has been helpful  Reported that her daughter did very well with gabapentin  higher dose for sleeping aid, agreed to try higher dose up to 300 mg 3 tablets every night,  We have completed neurology evaluation, no needs to continue follow-up, management of her chronic insomnia will be coordinated by her primary care  Meds ordered this encounter  Medications   gabapentin  (NEURONTIN ) 300 MG capsule    Sig: Take 3 capsules (900 mg total) by mouth at bedtime.    Dispense:  90 capsule    Refill:  11     Please see the MyChart message reply(ies) for my assessment and plan.    This patient gave consent for this Medical Advice Message and is aware that it may result in a bill to [employer], as well as the possibility of receiving a bill for a co-payment or deductible. They are an established patient, but are not seeking medical advice exclusively about a problem treated during an in person or video visit in the last seven days. I did not recommend an in person or video visit within seven days of my reply.    I spent a total of 10 minutes cumulative time within 7 days through [employer].  Modena Callander, MD

## 2023-07-10 NOTE — Addendum Note (Signed)
 Addended by: Kain Milosevic on: 07/10/2023 02:50 PM   Modules accepted: Orders

## 2023-07-10 NOTE — Telephone Encounter (Signed)
 Failed to reach patient, left message for her to call back.

## 2023-07-11 ENCOUNTER — Ambulatory Visit: Payer: Medicare Other | Admitting: Physical Therapy

## 2023-07-14 DIAGNOSIS — R42 Dizziness and giddiness: Secondary | ICD-10-CM | POA: Diagnosis not present

## 2023-07-14 DIAGNOSIS — G4700 Insomnia, unspecified: Secondary | ICD-10-CM | POA: Diagnosis not present

## 2023-07-15 NOTE — Therapy (Signed)
OUTPATIENT PHYSICAL THERAPY VESTIBULAR TREATMENT     Patient Name: Beverly [name] MRN: [id number] DOB:Jul 10, 1945, 78 y.o., female Today's Date: 07/16/2023  END OF SESSION:  PT End of Session - 07/16/23 1704     Visit Number 4    Number of Visits 7    Date for PT Re-Evaluation 07/28/23    Authorization Type Medicare/Aetna    PT Start Time 1619    PT Stop Time 1702    PT Time Calculation (min) 43 min    Activity Tolerance Patient tolerated treatment well    Behavior During Therapy [facility] for tasks assessed/performed                Past Medical History:  Diagnosis Date   Atypical mole 11/03/2013   R prox post lat thigh    Basal cell carcinoma    mid back   Basal cell carcinoma 04/01/2019   L chest parasternal    Basal cell carcinoma 09/11/2017   L mid back at braline 3.0 cm lat to spine   Emphysema lung (HCC)    Mild no symptoms Found on coronary CT scan   Esophageal dysmotility    GERD (gastroesophageal reflux disease)    occassional   OA (osteoarthritis)    hips and knees   Pneumonia    history of    PONV (postoperative nausea and vomiting)    Squamous cell carcinoma of skin 06/11/2021   L mid pretibia, [facility]   Past Surgical History:  Procedure Laterality Date   COLONOSCOPY     FRACTURE SURGERY  2009   left ankle   INGUINAL HERNIA REPAIR Right 08/22/2021   Procedure: OPEN RIGHT INGUINAL HERNIA REPAIR WITH MESH;  Surgeon: Berna Bue, MD;  Location: [facility];  Service: General;  Laterality: Right;   LAPAROSCOPY  1992   infection after tubal   SHOULDER OPEN ROTATOR CUFF REPAIR Right 2009   TOTAL HIP ARTHROPLASTY Left 01/29/2019   Procedure: LEFT TOTAL HIP ARTHROPLASTY ANTERIOR APPROACH;  Surgeon: Kathryne Hitch, MD;  Location: [facility];  Service: Orthopedics;  Laterality: Left;   TOTAL HIP ARTHROPLASTY Right 09/24/2019   Procedure: RIGHT TOTAL HIP ARTHROPLASTY ANTERIOR APPROACH;  Surgeon: Kathryne Hitch, MD;  [facility]: [facility];  Service:  Orthopedics;  Laterality: Right;   TUBAL LIGATION  1992   Patient Active Problem List   Diagnosis Date Noted   Restless leg 06/11/2023   Dizziness 03/25/2023   Gait abnormality 03/25/2023   Chronic insomnia 03/25/2023   Abnormal CT of the chest 02/08/2021   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   Primary osteoarthritis of left hip 12/07/2018   Primary osteoarthritis of right hip 12/07/2018   Pain of right hip joint 05/21/2017   Trochanteric bursitis, right hip 04/23/2017   Pain in right hip 04/23/2017   Hearing loss of right ear due to cerumen impaction 10/25/2016   Chronic rhinitis 07/04/2015   Cough 07/04/2015   Endometrial polyp 01/11/2011    PCP: Tally Joe, MD  REFERRING PROVIDER: Levert Feinstein, MD  REFERRING DIAG: R42 (ICD-10-CM) - Dizziness R26.9 (ICD-10-CM) - Gait abnormality F51.04 (ICD-10-CM) - Chronic insomnia  THERAPY DIAG:  Dizziness and giddiness  ONSET DATE: August 2024  Rationale for Evaluation and Treatment: Rehabilitation  SUBJECTIVE:   SUBJECTIVE STATEMENT: Reports that she is having a rough day- more unsteady and dizzy. Reports that she thinks reading sets her off as she spent a lot of  time reading yesterday. Reports HEP is okay expect walking backwards and EC.    Pt accompanied by: self  PERTINENT HISTORY: Emphysema, GERD, L ankle fx surgery, B THA, R RTC repair   PAIN:  Are you having pain? No  PRECAUTIONS: Fall  RED FLAGS: None   WEIGHT BEARING RESTRICTIONS: No  FALLS: Has patient fallen in last 6 months? No  LIVING ENVIRONMENT: Lives with: lives with their spouse Lives in: House/apartment; townhome Stairs:  3 steps to enter; 2 story home Has following equipment at home: Single point cane, Environmental consultant - 2 wheeled, Crutches, and shower chair  PLOF: Independent; retired; enjoys hiking   PATIENT GOALS: improve balance and dizziness  OBJECTIVE:    TODAY'S  TREATMENT: 07/16/23 Activity Comments  romberg EC + head turns/nods 30" C/o slightly more unsteadiness with nods   standing vertical/horizontal VOR in front of blinds 30" No dizziness   VOR cancellation in front of blinds 30" C/o mild dizziness and c/o some sensitivity to visual motion   standing on foam + head turns/nods 30" Moderate sway; no dizziness      HOME EXERCISE PROGRAM Last updated: 07/16/23 Access Code: [email or web address] URL: [email or web address] Date: 07/16/2023 Prepared by: [facility] - Outpatient  Rehab - [facility]  Exercises - Tandem Walking with Counter Support  - 1 x daily - 5 x weekly - 2 sets - 5 reps - Backward Tandem Walking with Counter Support  - 1 x daily - 5 x weekly - 2 sets - 5 reps - Romberg Stance with Head Rotation  - 1 x daily - 5 x weekly - 2 sets - 30 sec hold - VOR Cancellation  - 1 x daily - 5 x weekly - 2-3 sets - 30 sec hold - Romberg Stance on Foam Pad with Head Rotation  - 1 x daily - 5 x weekly - 2 sets - 30  hold - Romberg Stance with Head Nods on Foam Pad  - 1 x daily - 5 x weekly - 2 sets - 30  hold   PATIENT EDUCATION: Education details: discussed patient's c/o dizziness with reading; reports that she is currently using readers and separate glasses for distance- edu on bifocals showing increased risk of falls. Encouraged her to keep a log of hours reading and how she feels the next day; HEP update; discussed pt's c/o sensation of eyes moving in the past- explained that this could be nystagmus or oscillopsia d/t slightly positive B +HIT on eval Person educated: Patient Education method: Explanation, Demonstration, Tactile cues, Verbal cues, and Handouts Education comprehension: verbalized understanding and returned demonstration    HOME EXERCISE PROGRAM Access Code: [email or web address] Access Code: [email or web address] URL: [email or web address] Date: 07/07/2023 Prepared by: [facility] - Outpatient  Rehab - [facility]  Exercises - Brandt-Daroff Vestibular Exercise  - 1 x daily - 5 x weekly - 2 sets - 3-5 reps - Tandem Walking with Counter Support  - 1 x daily - 5 x weekly - 2 sets - 5 reps - Backward Tandem Walking with Counter Support  - 1 x daily - 5 x weekly - 2 sets - 5 reps - Romberg Stance with Head Rotation  - 1 x daily - 5 x weekly - 2 sets - 30 sec hold - Standing Feet Together Smooth Pursuit  - 1 x daily - 5 x weekly - 2 sets - 10 reps     Note: Objective measures were completed at Evaluation unless otherwise noted.  DIAGNOSTIC FINDINGS: 05/10/23: Normal head  CT.   Per Dr. Pollyann Kennedy (ENT): Ears are healthy and clear. She definitely has some cerebellar signs possibly posterior column as well. There is no real treatment for this. She may benefit with some vestibular rehab. Will make a referral. Follow-up as needed.   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   COGNITION: Overall cognitive status: Within functional limits for tasks assessed   SENSATION: Denies N/T in UEs/LEs    COORDINATION: Very slight B hand tremor at rest Alternating pronation/supination: WNL B Alternating toe tap: WNL B Finger to nose: mild intention tremor and dysmetria B  POSTURE:  rounded shoulders and forward head  GAIT: Gait pattern: WNL, slightly slowed and wide BOS Assistive device utilized: None Level of assistance: Modified independence   PATIENT SURVEYS:  FOTO NT d/t time   VESTIBULAR ASSESSMENT:  GENERAL OBSERVATION: pt does not wear glasses (readers only)  OCULOMOTOR EXAM:  Ocular Alignment: normal  Ocular ROM: No Limitations  Spontaneous Nystagmus: absent  Gaze-Induced Nystagmus: absent  Smooth Pursuits:  intact  Saccades: intact; reports mild dizziness   Convergence/Divergence: 3 cm   VESTIBULAR - OCULAR REFLEX:   Slow VOR: Normal (audible cavitation without pain)  VOR Cancellation: Normal  Head-Impulse Test: HIT Right: slightly positive HIT Left: slightly positive       POSITIONAL TESTING: NT                                                                                                                            TREATMENT DATE: 06/16/23  PATIENT EDUCATION: Education details: prognosis, POC, edu and handout on 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 Yes  SHORT TERM GOALS: Target date: 07/07/2023  Patient to be independent with initial HEP. Baseline: HEP initiated Goal status: MET    LONG TERM GOALS: Target date: 07/28/2023  Patient to be independent with advanced HEP. Baseline: Not yet initiated  Goal status: IN PROGRESS  Patient to report 50% improvement in symptoms.  Baseline: - Goal status: IN PROGRESS  Patient will report tolerance for consistent walking/hiking program without symptoms limiting.  Baseline: reports only walking 2x/week max Goal status: IN PROGRESS  Patient to demonstrate mild-moderate sway with M-CTSIB condition with eyes closed/foam surface in order to improve safety in environments with uneven surfaces and dim lighting. Baseline: NT Goal status: IN PROGRESS  Patient to score at least 22/30 on FGA in order to decrease risk of falls. Baseline: NT Goal status: IN PROGRESS   ASSESSMENT:  CLINICAL IMPRESSION: Patient arrived to session with report of increased dizziness and imbalance today- reports that she feels that increased reading yesterday may have contributed. Much  of session today focused on education and teasing out possible aggravating  factors/explanations for patient's symptoms. HEP was updated to include optokinetic and balance challenges which brought on slight dizziness or instability today. Patient tolerated session well despite some elevation in symptoms.   OBJECTIVE IMPAIRMENTS: Abnormal gait, decreased activity tolerance, decreased balance, and dizziness.   ACTIVITY LIMITATIONS: standing, stairs, bathing, self feeding, and locomotion level  PARTICIPATION LIMITATIONS: meal prep, cleaning, laundry, shopping, community activity, yard work, and church  PERSONAL FACTORS: Age, Past/current experiences, Time since onset of injury/illness/exacerbation, and 3+ comorbidities: Emphysema, GERD, L ankle fx surgery, B THA, R RTC repair   are also affecting patient's functional outcome.   REHAB POTENTIAL: Good  CLINICAL DECISION MAKING: Evolving/moderate complexity  EVALUATION COMPLEXITY: Moderate   PLAN:  PT FREQUENCY: 1-2x/week  PT DURATION: 6 weeks  PLANNED INTERVENTIONS: 97164- PT Re-evaluation, 97110-Therapeutic exercises, 97530- Therapeutic activity, 97112- Neuromuscular re-education, 97535- Self Care, 16109- Manual therapy, [phone]- Gait training, [phone]- Canalith repositioning, 97014- Electrical stimulation (unattended), Patient/Family education, Balance training, Stair training, Taping, Dry Needling, Joint mobilization, Spinal mobilization, Vestibular training, DME instructions, Cryotherapy, and Moist heat  PLAN FOR NEXT SESSION: review HEP, balance with head turns, multisensory balance, try optokinetic challenges     Baldemar Friday, PT, DPT 07/16/23 5:04 PM  [facility] at [facility] [address] [place], [place] [address] Phone # [phone] Fax # [phone]

## 2023-07-16 ENCOUNTER — Ambulatory Visit: Payer: Medicare Other | Admitting: Physical Therapy

## 2023-07-16 ENCOUNTER — Encounter: Payer: Self-pay | Admitting: Physical Therapy

## 2023-07-16 DIAGNOSIS — R42 Dizziness and giddiness: Secondary | ICD-10-CM

## 2023-07-16 NOTE — Therapy (Signed)
OUTPATIENT PHYSICAL THERAPY VESTIBULAR TREATMENT     Patient Name: Beverly [name] MRN: [id number] DOB:1945/12/13, 78 y.o., female Today's Date: 07/18/2023  END OF SESSION:  PT End of Session - 07/18/23 1149     Visit Number 5    Number of Visits 7    Date for PT Re-Evaluation 07/28/23    Authorization Type Medicare/Aetna    PT Start Time 1105    PT Stop Time 1145    PT Time Calculation (min) 40 min    Equipment Utilized During Treatment Gait belt    Activity Tolerance Patient tolerated treatment well    Behavior During Therapy WFL for tasks assessed/performed                 Past Medical History:  Diagnosis Date   Atypical mole 11/03/2013   R prox post lat thigh    Basal cell carcinoma    mid back   Basal cell carcinoma 04/01/2019   L chest parasternal    Basal cell carcinoma 09/11/2017   L mid back at braline 3.0 cm lat to spine   Emphysema lung (HCC)    Mild no symptoms Found on coronary CT scan   Esophageal dysmotility    GERD (gastroesophageal reflux disease)    occassional   OA (osteoarthritis)    hips and knees   Pneumonia    history of    PONV (postoperative nausea and vomiting)    Squamous cell carcinoma of skin 06/11/2021   L mid pretibia, [facility]   Past Surgical History:  Procedure Laterality Date   COLONOSCOPY     FRACTURE SURGERY  2009   left ankle   INGUINAL HERNIA REPAIR Right 08/22/2021   Procedure: OPEN RIGHT INGUINAL HERNIA REPAIR WITH MESH;  Surgeon: Berna Bue, MD;  Location: [facility];  Service: General;  Laterality: Right;   LAPAROSCOPY  1992   infection after tubal   SHOULDER OPEN ROTATOR CUFF REPAIR Right 2009   TOTAL HIP ARTHROPLASTY Left 01/29/2019   Procedure: LEFT TOTAL HIP ARTHROPLASTY ANTERIOR APPROACH;  Surgeon: Kathryne Hitch, MD;  Location: [facility];  Service: Orthopedics;  Laterality: Left;   TOTAL HIP ARTHROPLASTY Right 09/24/2019   Procedure: RIGHT TOTAL HIP ARTHROPLASTY ANTERIOR APPROACH;  Surgeon: Kathryne Hitch, MD;  Location: [facility];  Service: Orthopedics;  Laterality: Right;   TUBAL LIGATION  1992   Patient Active Problem List   Diagnosis Date Noted   Restless leg 06/11/2023   Dizziness 03/25/2023   Gait abnormality 03/25/2023   Chronic insomnia 03/25/2023   Abnormal CT of the chest 02/08/2021   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   Primary osteoarthritis of left hip 12/07/2018   Primary osteoarthritis of right hip 12/07/2018   Pain of right hip joint 05/21/2017   Trochanteric bursitis, right hip 04/23/2017   Pain in right hip 04/23/2017   Hearing loss of right ear due to cerumen impaction 10/25/2016   Chronic rhinitis 07/04/2015   Cough 07/04/2015   Endometrial polyp 01/11/2011    PCP: Tally Joe, MD  REFERRING PROVIDER: Levert Feinstein, MD  REFERRING DIAG: R42 (ICD-10-CM) - Dizziness R26.9 (ICD-10-CM) - Gait abnormality F51.04 (ICD-10-CM) - Chronic insomnia  THERAPY DIAG:  Dizziness and giddiness  ONSET DATE: August 2024  Rationale for Evaluation and Treatment: Rehabilitation  SUBJECTIVE:   SUBJECTIVE STATEMENT: I had a hard time not cancelling today. Mornings are the worst time of the day.  Reports that she took CBD for sleep and feels like she overdosed herself. Woke up the next morning sick to her stomach. Reports that she did not read or do much yesterday d/t not feeling well from the CBD the night before.    Pt accompanied by: self  PERTINENT HISTORY: Emphysema, GERD, L ankle fx surgery, B THA, R RTC repair   PAIN:  Are you having pain? No but reports nausea   PRECAUTIONS: Fall  RED FLAGS: None   WEIGHT BEARING RESTRICTIONS: No  FALLS: Has patient fallen in last 6 months? No  LIVING ENVIRONMENT: Lives with: lives with their spouse Lives in: House/apartment; townhome Stairs:  3 steps to enter; 2 story home Has following equipment at home: Single point cane, Environmental consultant - 2  wheeled, Crutches, and shower chair  PLOF: Independent; retired; enjoys hiking   PATIENT GOALS: improve balance and dizziness  OBJECTIVE:     TODAY'S TREATMENT: 07/18/23 Activity Comments  standing D2 flexion to cone on floor 2x5 each  No dizziness; c/o stability challenge   wall bumps EO/EC Good overall stability; more difficulty with shoulder strategy   bending to pick up cones on moving treadmill belt Performed at 0.3-0.4 mph without symptoms higher than baseline   gait + ball toss with visual tracking  CGA d/t mild instability and discontinuous steps, cueing to incorporate more head movement            PATIENT EDUCATION: Education details: spoke to pt about changing schedule for improved tolerance for appointments; discussed gym exercises that she could perform safety on days when symptoms are higher  Person educated: Patient Education method: Explanation Education comprehension: verbalized understanding   HOME EXERCISE PROGRAM Last updated: 07/16/23 Access Code: [email or web address] URL: [email or web address] Date: 07/16/2023 Prepared by: [facility] - Outpatient  Rehab - [facility]  Exercises - Tandem Walking with Counter Support  - 1 x daily - 5 x weekly - 2 sets - 5 reps - Backward Tandem Walking with Counter Support  - 1 x daily - 5 x weekly - 2 sets - 5 reps - Romberg Stance with Head Rotation  - 1 x daily - 5 x weekly - 2 sets - 30 sec hold - VOR Cancellation  - 1 x daily - 5 x weekly - 2-3 sets - 30 sec hold - Romberg Stance on Foam Pad with Head Rotation  - 1 x daily - 5 x weekly - 2 sets - 30  hold - Romberg Stance with Head Nods on Foam Pad  - 1 x daily - 5 x weekly - 2 sets - 30  hold    HOME EXERCISE PROGRAM Access Code: [email or web address] Access Code: [email or web address] URL: [email or web address] Date: 07/07/2023 Prepared by: [facility] - Outpatient  Rehab - [facility]  Exercises - Brandt-Daroff Vestibular Exercise  - 1 x daily - 5 x weekly -  2 sets - 3-5 reps - Tandem Walking with Counter Support  - 1 x daily - 5 x weekly - 2 sets - 5 reps - Backward Tandem Walking with Counter Support  - 1 x daily - 5 x weekly - 2 sets - 5 reps - Romberg Stance with Head Rotation  - 1 x daily - 5 x weekly - 2 sets - 30 sec hold - Standing Feet Together Smooth Pursuit  - 1 x daily - 5 x weekly - 2 sets - 10 reps     Note: Objective measures were completed at Evaluation unless otherwise noted.  DIAGNOSTIC FINDINGS:  05/10/23: Normal head CT.   Per Dr. Pollyann Kennedy (ENT): Ears are healthy and clear. She definitely has some cerebellar signs possibly posterior column as well. There is no real treatment for this. She may benefit with some vestibular rehab. Will make a referral. Follow-up as needed.   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   COGNITION: Overall cognitive status: Within functional limits for tasks assessed   SENSATION: Denies N/T in UEs/LEs    COORDINATION: Very slight B hand tremor at rest Alternating pronation/supination: WNL B Alternating toe tap: WNL B Finger to nose: mild intention tremor and dysmetria B  POSTURE:  rounded shoulders and forward head  GAIT: Gait pattern: WNL, slightly slowed and wide BOS Assistive device utilized: None Level of assistance: Modified independence   PATIENT SURVEYS:  FOTO NT d/t time   VESTIBULAR ASSESSMENT:  GENERAL OBSERVATION: pt does not wear glasses (readers only)  OCULOMOTOR EXAM:  Ocular Alignment: normal  Ocular ROM: No Limitations  Spontaneous Nystagmus: absent  Gaze-Induced Nystagmus: absent  Smooth Pursuits: intact  Saccades: intact; reports mild dizziness   Convergence/Divergence: 3 cm   VESTIBULAR -  OCULAR REFLEX:   Slow VOR: Normal (audible cavitation without pain)  VOR Cancellation: Normal  Head-Impulse Test: HIT Right: slightly positive HIT Left: slightly positive       POSITIONAL TESTING: NT                                                                                                                            TREATMENT DATE: 06/16/23  PATIENT EDUCATION: Education details: prognosis, POC, edu and handout on 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 Yes  SHORT TERM GOALS: Target date: 07/07/2023  Patient to be independent with initial HEP. Baseline: HEP initiated Goal status: MET    LONG TERM GOALS: Target date: 07/28/2023  Patient to be independent with advanced HEP. Baseline: Not yet initiated  Goal status: IN PROGRESS  Patient to report 50% improvement in symptoms.  Baseline: - Goal status: IN PROGRESS  Patient will report tolerance for consistent walking/hiking program without symptoms limiting.  Baseline: reports only walking 2x/week max Goal status: IN PROGRESS  Patient to demonstrate mild-moderate sway with M-CTSIB condition with eyes closed/foam surface in order to improve safety in environments with uneven surfaces and dim lighting. Baseline: NT Goal status: IN PROGRESS  Patient to score at least 22/30 on FGA in order to decrease risk of falls. Baseline: NT Goal status: IN PROGRESS   ASSESSMENT:  CLINICAL IMPRESSION: Patient arrived to session with report of elevated symptoms this morning. Reports having more elevated symptoms yesterday as well which she  attributes to taking too much CBD for the first time. Performed a variety of balance activities with added visual motion challenges. Patient seemed to actually tolerate all exercises well and did not report  increase in dizziness with any activities, only some instability. Will attempt use of more optokinetics to weed out aggravating factors. Patient reported some improvement in nausea at end of session.   OBJECTIVE IMPAIRMENTS: Abnormal gait, decreased activity tolerance, decreased balance, and dizziness.   ACTIVITY LIMITATIONS: standing, stairs, bathing, self feeding, and locomotion level  PARTICIPATION LIMITATIONS: meal prep, cleaning, laundry, shopping, community activity, yard work, and church  PERSONAL FACTORS: Age, Past/current experiences, Time since onset of injury/illness/exacerbation, and 3+ comorbidities: Emphysema, GERD, L ankle fx surgery, B THA, R RTC repair   are also affecting patient's functional outcome.   REHAB POTENTIAL: Good  CLINICAL DECISION MAKING: Evolving/moderate complexity  EVALUATION COMPLEXITY: Moderate   PLAN:  PT FREQUENCY: 1-2x/week  PT DURATION: 6 weeks  PLANNED INTERVENTIONS: 97164- PT Re-evaluation, 97110-Therapeutic exercises, 97530- Therapeutic activity, 97112- Neuromuscular re-education, 97535- Self Care, 95621- Manual therapy, [phone]- Gait training, [phone]- Canalith repositioning, 97014- Electrical stimulation (unattended), Patient/Family education, Balance training, Stair training, Taping, Dry Needling, Joint mobilization, Spinal mobilization, Vestibular training, DME instructions, Cryotherapy, and Moist heat  PLAN FOR NEXT SESSION: add optokinetic challenges, balance with head turns, multisensory balance    Baldemar Friday, PT, DPT 07/18/23 11:50 AM  [facility] at [facility] [address] [place], [place] [address] Phone # [phone] Fax # [phone]

## 2023-07-17 NOTE — Therapy (Signed)
OUTPATIENT PHYSICAL THERAPY VESTIBULAR TREATMENT     Patient Name: Beverly [name] MRN: [id number] DOB:12-20-1945, 78 y.o., female Today's Date: 07/21/2023  END OF SESSION:  PT End of Session - 07/21/23 1433     Visit Number 6    Number of Visits 7    Date for PT Re-Evaluation 07/28/23    Authorization Type Medicare/[employer]    PT Start Time 1400    PT Stop Time 1441    PT Time Calculation (min) 41 min    Equipment Utilized During Treatment Gait belt    Activity Tolerance Patient tolerated treatment well    Behavior During Therapy WFL for tasks assessed/performed                 Past Medical History:  Diagnosis Date   Atypical mole 11/03/2013   R prox post lat thigh    Basal cell carcinoma    mid back   Basal cell carcinoma 04/01/2019   L chest parasternal    Basal cell carcinoma 09/11/2017   L mid back at braline 3.0 cm lat to spine   Emphysema lung (HCC)    Mild no symptoms Found on coronary CT scan   Esophageal dysmotility    GERD (gastroesophageal reflux disease)    occassional   OA (osteoarthritis)    hips and knees   Pneumonia    history of    PONV (postoperative nausea and vomiting)    Squamous cell carcinoma of skin 06/11/2021   L mid pretibia, [facility]   Past Surgical History:  Procedure Laterality Date   COLONOSCOPY     FRACTURE SURGERY  2009   left ankle   INGUINAL HERNIA REPAIR Right 08/22/2021   Procedure: OPEN RIGHT INGUINAL HERNIA REPAIR WITH MESH;  Surgeon: Berna Bue, MD;  Location: [facility];  Service: General;  Laterality: Right;   LAPAROSCOPY  1992   infection after tubal   SHOULDER OPEN ROTATOR CUFF REPAIR Right 2009   TOTAL HIP ARTHROPLASTY Left 01/29/2019   Procedure: LEFT TOTAL HIP ARTHROPLASTY ANTERIOR APPROACH;  Surgeon: Kathryne Hitch, MD;  Location: [facility];  Service: Orthopedics;  Laterality: Left;   TOTAL HIP ARTHROPLASTY Right 09/24/2019   Procedure: RIGHT TOTAL HIP ARTHROPLASTY ANTERIOR APPROACH;  Surgeon: Kathryne Hitch, MD;  Location: [facility];  Service: Orthopedics;  Laterality: Right;   TUBAL LIGATION  1992   Patient Active Problem List   Diagnosis Date Noted   Restless leg 06/11/2023   Dizziness 03/25/2023   Gait abnormality 03/25/2023   Chronic insomnia 03/25/2023   Abnormal CT of the chest 02/08/2021   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   Primary osteoarthritis of left hip 12/07/2018   Primary osteoarthritis of right hip 12/07/2018   Pain of right hip joint 05/21/2017   Trochanteric bursitis, right hip 04/23/2017   Pain in right hip 04/23/2017   Hearing loss of right ear due to cerumen impaction 10/25/2016   Chronic rhinitis 07/04/2015   Cough 07/04/2015   Endometrial polyp 01/11/2011    PCP: Tally Joe, MD  REFERRING PROVIDER: Levert Feinstein, MD  REFERRING DIAG: R42 (ICD-10-CM) - Dizziness R26.9 (ICD-10-CM) - Gait abnormality F51.04 (ICD-10-CM) - Chronic insomnia  THERAPY DIAG:  Dizziness and giddiness  ONSET DATE: August 2024  Rationale for Evaluation and Treatment: Rehabilitation  SUBJECTIVE:   SUBJECTIVE STATEMENT: "I feel like I walk like a drunk." I've essentially been sick to my stomach since  the last session despite not feeling it during the exercises. Took Dramamine which helped. Asking for a new handout of her exercises.    Pt accompanied by: self  PERTINENT HISTORY: Emphysema, GERD, L ankle fx surgery, B THA, R RTC repair   PAIN:  Are you having pain? No  PRECAUTIONS: Fall  RED FLAGS: None   WEIGHT BEARING RESTRICTIONS: No  FALLS: Has patient fallen in last 6 months? No  LIVING ENVIRONMENT: Lives with: lives with their spouse Lives in: House/apartment; townhome Stairs:  3 steps to enter; 2 story home Has following equipment at home: Single point cane, Environmental consultant - 2 wheeled, Crutches, and shower chair  PLOF: Independent; retired; enjoys hiking   PATIENT GOALS: improve  balance and dizziness  OBJECTIVE:     TODAY'S TREATMENT: 07/21/23 Activity Comments  optokinetic disco ball in dark room: Standing romberg Performed with door open to reduce intensity. Cueing to maintain normal breathing pattern. Reported mild increase in wooziness and nausea.    standing on foam EO and EC + head turns/nods  Mild sway EO; mod-severe sway EC     HOME EXERCISE PROGRAM Access Code: [email or web address] URL: [email or web address] Date: 07/21/2023 Prepared by: [facility] - Outpatient  Rehab - [facility]  Exercises - Backward Tandem Walking with Counter Support  - 1 x daily - 5 x weekly - 2 sets - 5 reps - Romberg Stance with Head Rotation  - 1 x daily - 5 x weekly - 2 sets - 30 sec hold - VOR Cancellation  - 1 x daily - 5 x weekly - 2-3 sets - 30 sec hold - Romberg Stance on Foam Pad with Head Rotation  - 1 x daily - 5 x weekly - 2 sets - 30  hold - Romberg Stance with Head Nods on Foam Pad  - 1 x daily - 5 x weekly - 2 sets - 30  hold    PATIENT EDUCATION: Education details: re-administered HEP handout, edu on how to safely progress optokinetic challenges; provided handout on trying 3:06 youtube video "A walk round [place] in [place] ([place])", discussed how hx of migraines can also increase visual vertigo  Person educated: Patient Education method: Explanation, Demonstration, Tactile cues, Verbal cues, and Handouts Education comprehension: verbalized understanding and returned demonstration     Note: Objective measures were completed at Evaluation unless otherwise noted.  DIAGNOSTIC FINDINGS: 05/10/23: Normal head CT.   Per Dr. Pollyann Kennedy (ENT): Ears are healthy and clear. She definitely has some cerebellar signs possibly posterior column as well. There is no real treatment for this. She may benefit with some vestibular rehab. Will make a referral. Follow-up as needed.   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   COGNITION: Overall cognitive status: Within functional limits for tasks assessed   SENSATION: Denies N/T in UEs/LEs    COORDINATION: Very slight B hand tremor at rest Alternating pronation/supination: WNL B Alternating toe tap: WNL B Finger to nose: mild intention tremor and dysmetria B  POSTURE:  rounded shoulders and forward head  GAIT: Gait pattern: WNL, slightly slowed and wide BOS Assistive device utilized: None Level of assistance: Modified independence   PATIENT SURVEYS:  FOTO NT d/t time   VESTIBULAR ASSESSMENT:  GENERAL OBSERVATION: pt does not wear  glasses (readers only)  OCULOMOTOR EXAM:  Ocular Alignment: normal  Ocular ROM: No Limitations  Spontaneous Nystagmus: absent  Gaze-Induced Nystagmus: absent  Smooth Pursuits: intact  Saccades: intact; reports mild dizziness   Convergence/Divergence: 3 cm   VESTIBULAR - OCULAR REFLEX:   Slow VOR: Normal (audible cavitation without pain)  VOR Cancellation: Normal  Head-Impulse Test: HIT Right: slightly positive HIT Left: slightly positive       POSITIONAL TESTING: NT                                                                                                                            TREATMENT DATE: 06/16/23  PATIENT EDUCATION: Education details: prognosis, POC, edu and handout on 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 Yes  SHORT TERM GOALS: Target date: 07/07/2023  Patient to be independent with initial HEP. Baseline: HEP initiated Goal status: MET    LONG TERM GOALS:  Target date: 07/28/2023  Patient to be independent with advanced HEP. Baseline: Not yet initiated  Goal status: IN PROGRESS  Patient to report 50% improvement in symptoms.  Baseline: - Goal status: IN PROGRESS  Patient will report tolerance for consistent walking/hiking program without symptoms limiting.  Baseline: reports only walking 2x/week max Goal status: IN PROGRESS  Patient to demonstrate mild-moderate sway with M-CTSIB condition with eyes closed/foam surface in order to improve safety in environments with uneven surfaces and dim lighting. Baseline: NT Goal status: IN PROGRESS  Patient to score at least 22/30 on FGA in order to decrease risk of falls. Baseline: NT Goal status: IN PROGRESS   ASSESSMENT:  CLINICAL IMPRESSION: Patient arrived to session with report of increased nausea since last session. Patient agreeable to trying optokinetic challenges today which did elevate "wooziness," imbalance, and c/o nausea. Thus, provided detailed edu on slow progressive optokinetic protocol using YouTube video which simulates crowded environments (as pt reports that this is an aggravating environment for her). Patient reported slightly elevated symptoms at end of session.   OBJECTIVE IMPAIRMENTS: Abnormal gait, decreased activity tolerance, decreased balance, and dizziness.   ACTIVITY LIMITATIONS: standing, stairs, bathing, self feeding, and locomotion level  PARTICIPATION LIMITATIONS: meal prep, cleaning, laundry, shopping, community activity, yard work, and church  PERSONAL FACTORS: Age, Past/current experiences, Time since onset of injury/illness/exacerbation, and 3+ comorbidities: Emphysema, GERD, L ankle fx surgery, B THA, R RTC repair   are also affecting patient's functional outcome.   REHAB POTENTIAL: Good  CLINICAL DECISION MAKING: Evolving/moderate complexity  EVALUATION COMPLEXITY: Moderate   PLAN:  PT FREQUENCY: 1-2x/week  PT DURATION: 6 weeks  PLANNED  INTERVENTIONS: 97164- PT Re-evaluation, 97110-Therapeutic exercises, 97530- Therapeutic activity, 97112- Neuromuscular re-education, 97535- Self Care, 09811- Manual therapy, [id number]- Gait training, [phone]- Canalith repositioning, 97014- Electrical stimulation (unattended), Patient/Family education, Balance training, Stair training, Taping, Dry Needling, Joint mobilization, Spinal mobilization, Vestibular training, DME instructions, Cryotherapy,  and Moist heat  PLAN FOR NEXT SESSION: review HEP, balance with head turns, multisensory balance, try optokinetic challenges     Baldemar Friday, PT, DPT 07/21/23 2:43 PM  [facility] at [facility] [address] [place], [address] [place], [place] [address] Phone # [phone] Fax # [phone]

## 2023-07-18 ENCOUNTER — Ambulatory Visit: Payer: Medicare Other | Admitting: Physical Therapy

## 2023-07-18 ENCOUNTER — Encounter: Payer: Self-pay | Admitting: Physical Therapy

## 2023-07-18 DIAGNOSIS — R42 Dizziness and giddiness: Secondary | ICD-10-CM

## 2023-07-21 ENCOUNTER — Ambulatory Visit: Payer: Medicare Other | Admitting: Physical Therapy

## 2023-07-21 DIAGNOSIS — R42 Dizziness and giddiness: Secondary | ICD-10-CM | POA: Diagnosis not present

## 2023-07-23 DIAGNOSIS — M5011 Cervical disc disorder with radiculopathy,  high cervical region: Secondary | ICD-10-CM | POA: Diagnosis not present

## 2023-07-23 DIAGNOSIS — M9901 Segmental and somatic dysfunction of cervical region: Secondary | ICD-10-CM | POA: Diagnosis not present

## 2023-07-23 NOTE — Therapy (Signed)
OUTPATIENT PHYSICAL THERAPY VESTIBULAR TREATMENT     Patient Name: Beverly [name] MRN: [id number] DOB:[birth year], 78 y.o., female Today's Date: 07/23/2023  END OF SESSION:        Past Medical History:  Diagnosis Date   Atypical mole 11/03/2013   R prox post lat thigh    Basal cell carcinoma    mid back   Basal cell carcinoma 04/01/2019   L chest parasternal    Basal cell carcinoma 09/11/2017   L mid back at braline 3.0 cm lat to spine   Emphysema lung (HCC)    Mild no symptoms Found on coronary CT scan   Esophageal dysmotility    GERD (gastroesophageal reflux disease)    occassional   OA (osteoarthritis)    hips and knees   Pneumonia    history of    PONV (postoperative nausea and vomiting)    Squamous cell carcinoma of skin 06/11/2021   L mid pretibia, [facility]   Past Surgical History:  Procedure Laterality Date   COLONOSCOPY     FRACTURE SURGERY  2009   left ankle   INGUINAL HERNIA REPAIR Right 08/22/2021   Procedure: OPEN RIGHT INGUINAL HERNIA REPAIR WITH MESH;  Surgeon: Berna Bue, MD;  Location: [facility];  Service: General;  Laterality: Right;   LAPAROSCOPY  1992   infection after tubal   SHOULDER OPEN ROTATOR CUFF REPAIR Right 2009   TOTAL HIP ARTHROPLASTY Left 01/29/2019   Procedure: LEFT TOTAL HIP ARTHROPLASTY ANTERIOR APPROACH;  Surgeon: Kathryne Hitch, MD;  Location: [facility];  Service: Orthopedics;  Laterality: Left;   TOTAL HIP ARTHROPLASTY Right 09/24/2019   Procedure: RIGHT TOTAL HIP ARTHROPLASTY ANTERIOR APPROACH;  Surgeon: Kathryne Hitch, MD;  Location: [facility];  Service: Orthopedics;  Laterality: Right;   TUBAL LIGATION  1992   Patient Active Problem List   Diagnosis Date Noted   Restless leg 06/11/2023   Dizziness 03/25/2023   Gait abnormality 03/25/2023   Chronic insomnia 03/25/2023   Abnormal CT of the chest 02/08/2021   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   Status post total replacement of left hip  01/29/2019   Primary osteoarthritis of left hip 12/07/2018   Primary osteoarthritis of right hip 12/07/2018   Pain of right hip joint 05/21/2017   Trochanteric bursitis, right hip 04/23/2017   Pain in right hip 04/23/2017   Hearing loss of right ear due to cerumen impaction 10/25/2016   Chronic rhinitis 07/04/2015   Cough 07/04/2015   Endometrial polyp 01/11/2011    PCP: Tally Joe, MD  REFERRING PROVIDER: Levert Feinstein, MD  REFERRING DIAG: R42 (ICD-10-CM) - Dizziness R26.9 (ICD-10-CM) - Gait abnormality F51.04 (ICD-10-CM) - Chronic insomnia  THERAPY DIAG:  No diagnosis found.  ONSET DATE: August 2024  Rationale for Evaluation and Treatment: Rehabilitation  SUBJECTIVE:   SUBJECTIVE STATEMENT: "I feel like I walk like a drunk." I've essentially been sick to my stomach since the last session despite not feeling it during the exercises. Took Dramamine which helped. Asking for a new handout of her exercises.    Pt accompanied by: self  PERTINENT HISTORY: Emphysema, GERD, L ankle fx surgery, B THA, R RTC repair   PAIN:  Are you having pain? No  PRECAUTIONS: Fall  RED FLAGS: None   WEIGHT BEARING RESTRICTIONS: No  FALLS: Has patient fallen in last 6 months? No  LIVING ENVIRONMENT: Lives with: lives with their spouse Lives in: House/apartment; townhome Stairs:  3 steps to enter; 2 story  home Has following equipment at home: Single point cane, Walker - 2 wheeled, Crutches, and shower chair  PLOF: Independent; retired; enjoys hiking   PATIENT GOALS: improve balance and dizziness  OBJECTIVE:     TODAY'S TREATMENT: 07/24/23 Activity Comments                           HOME EXERCISE PROGRAM Access Code: [email or web address] URL: [email or web address] Date: 07/21/2023 Prepared by: [facility] - Outpatient  Rehab - [facility]  Exercises - Backward Tandem Walking with Counter Support  - 1 x daily - 5 x weekly - 2 sets - 5 reps - Romberg  Stance with Head Rotation  - 1 x daily - 5 x weekly - 2 sets - 30 sec hold - VOR Cancellation  - 1 x daily - 5 x weekly - 2-3 sets - 30 sec hold - Romberg Stance on Foam Pad with Head Rotation  - 1 x daily - 5 x weekly - 2 sets - 30  hold - Romberg Stance with Head Nods on Foam Pad  - 1 x daily - 5 x weekly - 2 sets - 30  hold    PATIENT EDUCATION: Education details: re-administered HEP handout, edu on how to safely progress optokinetic challenges; provided handout on trying 3:06 youtube video "A walk [place] in [place] ([place])", discussed how hx of migraines can also increase visual vertigo  Person educated: Patient Education method: Explanation, Demonstration, Tactile cues, Verbal cues, and Handouts Education comprehension: verbalized understanding and returned demonstration     Note: Objective measures were completed at Evaluation unless otherwise noted.  DIAGNOSTIC FINDINGS: 05/10/23: Normal head CT.   Per Dr. Pollyann Kennedy (ENT): Ears are healthy and clear. She definitely has some cerebellar signs possibly posterior column as well. There is no real treatment for this. She may benefit with some vestibular rehab. Will make a referral. Follow-up as needed.   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   COGNITION: Overall cognitive status: Within functional limits for tasks assessed   SENSATION: Denies N/T in UEs/LEs    COORDINATION: Very slight B hand tremor at rest Alternating pronation/supination: WNL B Alternating toe tap: WNL B Finger to nose: mild intention tremor and dysmetria B  POSTURE:  rounded shoulders and forward head  GAIT: Gait pattern: WNL, slightly slowed and wide BOS Assistive device  utilized: None Level of assistance: Modified independence   PATIENT SURVEYS:  FOTO NT d/t time   VESTIBULAR ASSESSMENT:  GENERAL OBSERVATION: pt does not wear glasses (readers only)  OCULOMOTOR EXAM:  Ocular Alignment: normal  Ocular ROM: No Limitations  Spontaneous Nystagmus: absent  Gaze-Induced Nystagmus: absent  Smooth Pursuits: intact  Saccades: intact; reports mild dizziness   Convergence/Divergence: 3 cm   VESTIBULAR - OCULAR REFLEX:   Slow VOR: Normal (audible cavitation without pain)  VOR Cancellation: Normal  Head-Impulse Test: HIT Right: slightly positive HIT Left: slightly positive       POSITIONAL TESTING: NT  TREATMENT DATE: 06/16/23  PATIENT EDUCATION: Education details: prognosis, POC, edu and handout on 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 Yes  SHORT TERM GOALS: Target date: 07/07/2023  Patient to be independent with initial HEP. Baseline: HEP initiated Goal status: MET    LONG TERM GOALS: Target date: 07/28/2023  Patient to be independent with advanced HEP. Baseline: Not yet initiated  Goal status: IN PROGRESS  Patient to report 50% improvement in symptoms.  Baseline: - Goal status: IN PROGRESS  Patient will report tolerance for consistent walking/hiking program without symptoms limiting.  Baseline: reports only walking 2x/week max Goal status: IN PROGRESS  Patient to demonstrate mild-moderate sway with M-CTSIB condition with eyes closed/foam surface in order to improve safety in environments with uneven surfaces and dim lighting. Baseline: NT Goal status: IN PROGRESS  Patient to score at least 22/30 on FGA in order to decrease  risk of falls. Baseline: NT Goal status: IN PROGRESS   ASSESSMENT:  CLINICAL IMPRESSION: Patient arrived to session with report of increased nausea since last session. Patient agreeable to trying optokinetic challenges today which did elevate "wooziness," imbalance, and c/o nausea. Thus, provided detailed edu on slow progressive optokinetic protocol using YouTube video which simulates crowded environments (as pt reports that this is an aggravating environment for her). Patient reported slightly elevated symptoms at end of session.   OBJECTIVE IMPAIRMENTS: Abnormal gait, decreased activity tolerance, decreased balance, and dizziness.   ACTIVITY LIMITATIONS: standing, stairs, bathing, self feeding, and locomotion level  PARTICIPATION LIMITATIONS: meal prep, cleaning, laundry, shopping, community activity, yard work, and church  PERSONAL FACTORS: Age, Past/current experiences, Time since onset of injury/illness/exacerbation, and 3+ comorbidities: Emphysema, GERD, L ankle fx surgery, B THA, R RTC repair   are also affecting patient's functional outcome.   REHAB POTENTIAL: Good  CLINICAL DECISION MAKING: Evolving/moderate complexity  EVALUATION COMPLEXITY: Moderate   PLAN:  PT FREQUENCY: 1-2x/week  PT DURATION: 6 weeks  PLANNED INTERVENTIONS: 97164- PT Re-evaluation, 97110-Therapeutic exercises, 97530- Therapeutic activity, 97112- Neuromuscular re-education, 97535- Self Care, 81191- Manual therapy, [phone]- Gait training, [id number]- Canalith repositioning, 97014- Electrical stimulation (unattended), Patient/Family education, Balance training, Stair training, Taping, Dry Needling, Joint mobilization, Spinal mobilization, Vestibular training, DME instructions, Cryotherapy, and Moist heat  PLAN FOR NEXT SESSION: review HEP, balance with head turns, multisensory balance, try optokinetic challenges     Baldemar Friday, PT, DPT 07/23/23 7:55 AM  [facility] at  [facility] [address], [address] [place], [place] [address] Phone # [phone] Fax # [phone]

## 2023-07-24 ENCOUNTER — Encounter: Payer: Self-pay | Admitting: Physical Therapy

## 2023-07-24 ENCOUNTER — Ambulatory Visit: Payer: Medicare Other | Admitting: Physical Therapy

## 2023-07-24 DIAGNOSIS — R42 Dizziness and giddiness: Secondary | ICD-10-CM | POA: Diagnosis not present

## 2023-07-25 NOTE — Therapy (Signed)
 OUTPATIENT PHYSICAL THERAPY VESTIBULAR NOTE     Patient Name: Beverly [name] MRN: [id number] DOB:1946/04/25, 78 y.o., female Today's Date: 07/28/2023     END OF SESSION:  PT End of Session - 07/28/23 1608     Visit Number 8    Number of Visits 9    Date for PT Re-Evaluation 08/07/23    Authorization Type Medicare/Aetna    PT Start Time 1530    PT Stop Time 1609    PT Time Calculation (min) 39 min    Equipment Utilized During Treatment Gait belt    Activity Tolerance Patient tolerated treatment well    Behavior During Therapy WFL for tasks assessed/performed                   Past Medical History:  Diagnosis Date   Atypical mole 11/03/2013   R prox post lat thigh    Basal cell carcinoma    mid back   Basal cell carcinoma 04/01/2019   L chest parasternal    Basal cell carcinoma 09/11/2017   L mid back at braline 3.0 cm lat to spine   Emphysema lung (HCC)    Mild no symptoms Found on coronary CT scan   Esophageal dysmotility    GERD (gastroesophageal reflux disease)    occassional   OA (osteoarthritis)    hips and knees   Pneumonia    history of    PONV (postoperative nausea and vomiting)    Squamous cell carcinoma of skin 06/11/2021   L mid pretibia, [facility]   Past Surgical History:  Procedure Laterality Date   COLONOSCOPY     FRACTURE SURGERY  2009   left ankle   INGUINAL HERNIA REPAIR Right 08/22/2021   Procedure: OPEN RIGHT INGUINAL HERNIA REPAIR WITH MESH;  Surgeon: Berna Bue, MD;  Location: [facility];  Service: General;  Laterality: Right;   LAPAROSCOPY  1992   infection after tubal   SHOULDER OPEN ROTATOR CUFF REPAIR Right 2009   TOTAL HIP ARTHROPLASTY Left 01/29/2019   Procedure: LEFT TOTAL HIP ARTHROPLASTY ANTERIOR APPROACH;  Surgeon: Kathryne Hitch, MD;  Location: [facility];  Service: Orthopedics;  Laterality: Left;   TOTAL HIP ARTHROPLASTY Right 09/24/2019   Procedure: RIGHT TOTAL HIP ARTHROPLASTY ANTERIOR APPROACH;  Surgeon:  Kathryne Hitch, MD;  [facility]: [facility];  Service: Orthopedics;  Laterality: Right;   TUBAL LIGATION  1992   Patient Active Problem List   Diagnosis Date Noted   Restless leg 06/11/2023   Dizziness 03/25/2023   Gait abnormality 03/25/2023   Chronic insomnia 03/25/2023   Abnormal CT of the chest 02/08/2021   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   Primary osteoarthritis of left hip 12/07/2018   Primary osteoarthritis of right hip 12/07/2018   Pain of right hip joint 05/21/2017   Trochanteric bursitis, right hip 04/23/2017   Pain in right hip 04/23/2017   Hearing loss of right ear due to cerumen impaction 10/25/2016   Chronic rhinitis 07/04/2015   Cough 07/04/2015   Endometrial polyp 01/11/2011    PCP: Tally Joe, MD  REFERRING PROVIDER: Levert Feinstein, MD  REFERRING DIAG: R42 (ICD-10-CM) - Dizziness R26.9 (ICD-10-CM) - Gait abnormality F51.04 (ICD-10-CM) - Chronic insomnia  THERAPY DIAG:  Dizziness and giddiness  ONSET DATE: August 2024  Rationale for Evaluation and Treatment: Rehabilitation  SUBJECTIVE:   SUBJECTIVE STATEMENT: Did quite a bit of walking over the weekend and felt  really good. Overall this week doing well. When working on one of her exercises she noticed some dizziness (diagonal chop) however no longer dizzy with VOR cancellation in front of blinds. Reports that she tried optokinetic videos in the dark and on bigger screen and she still did not have sx. However, reports that going to the grocery store and walking around "really stirs me up."   Pt accompanied by: self  PERTINENT HISTORY: Emphysema, GERD, L ankle fx surgery, B THA, R RTC repair   PAIN:  Are you having pain? No  PRECAUTIONS: Fall  RED FLAGS: None   WEIGHT BEARING RESTRICTIONS: No  FALLS: Has patient fallen in last 6 months? No  LIVING ENVIRONMENT: Lives with: lives with their spouse Lives in:  House/apartment; townhome Stairs:  3 steps to enter; 2 story home Has following equipment at home: Single point cane, Environmental consultant - 2 wheeled, Crutches, and shower chair  PLOF: Independent; retired; enjoys hiking   PATIENT GOALS: improve balance and dizziness  OBJECTIVE:     TODAY'S TREATMENT: 07/28/23 Activity Comments  Standing on foam + head/eye movements to find cards on mirror Asymptomatic   gait + head turns/nods  Mild-mod instability; c/o mild dizziness, worse with head turns   gait + VOR cancellation (horizontal and vertical)   Improved stability, c/o still some mild dizziness   Fwd/back stepping 15x each  Occasional imbalance; cueing for wider BOS            PATIENT EDUCATION: Education details: encouraged pt to perform short grocery store runs, allowing symptoms rise to a mild-moderate intensity for habituation ; HEP update and consolidation  Person educated: Patient Education method: Explanation, Demonstration, Tactile cues, Verbal cues, and Handouts Education comprehension: verbalized understanding and returned demonstration    HOME EXERCISE PROGRAM Access Code: [email or web address] URL: [email or web address] Date: 07/28/2023 Prepared by: [facility] - Outpatient  Rehab - [facility]  Exercises - Backward Tandem Walking with Counter Support  - 1 x daily - 5 x weekly - 2 sets - 5 reps - Romberg Stance with Head Rotation  - 1 x daily - 5 x weekly - 2 sets - 30 sec hold - Standing Diagonal Chops with Medicine Ball  - 1 x daily - 5 x weekly - 2 sets - 10 reps - Gait with Head Nods and Turns on Grass  - 1 x daily - 5 x weekly - 2 sets - 4-5 reps - Forward Walking Horizontal VOR Cancellation  - 1 x daily - 5 x weekly - 2 sets - 4-5 reps - Alternating Step Forward with Support  - 1 x daily - 5 x weekly - 2 sets - 10 reps    Note: Objective measures were completed at Evaluation unless otherwise noted.  DIAGNOSTIC FINDINGS: 05/10/23: Normal head CT.   Per Dr.  Pollyann Kennedy (ENT): Ears are healthy and clear. She definitely has some cerebellar signs possibly posterior column as well. There is no real treatment for this. She may benefit with some vestibular rehab. Will make a referral. Follow-up as needed.   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   COGNITION: Overall cognitive status: Within functional limits for tasks assessed   SENSATION: Denies N/T in UEs/LEs    COORDINATION: Very slight B hand tremor at rest  Alternating pronation/supination: WNL B Alternating toe tap: WNL B Finger to nose: mild intention tremor and dysmetria B  POSTURE:  rounded shoulders and forward head  GAIT: Gait pattern: WNL, slightly slowed and wide BOS Assistive device utilized: None Level of assistance: Modified independence   PATIENT SURVEYS:  FOTO NT d/t time   VESTIBULAR ASSESSMENT:  GENERAL OBSERVATION: pt does not wear glasses (readers only)  OCULOMOTOR EXAM:  Ocular Alignment: normal  Ocular ROM: No Limitations  Spontaneous Nystagmus: absent  Gaze-Induced Nystagmus: absent  Smooth Pursuits: intact  Saccades: intact; reports mild dizziness   Convergence/Divergence: 3 cm   VESTIBULAR - OCULAR REFLEX:   Slow VOR: Normal (audible cavitation without pain)  VOR Cancellation: Normal  Head-Impulse Test: HIT Right: slightly positive HIT Left: slightly positive       POSITIONAL TESTING: NT                                                                                                                            TREATMENT DATE: 06/16/23  PATIENT EDUCATION: Education details: prognosis, POC, edu and handout on 3PD Person educated: Patient Education method: Explanation, Demonstration,  Tactile cues, Verbal cues, and Handouts Education comprehension: verbalized understanding  HOME EXERCISE PROGRAM:  GOALS: Goals reviewed with patient? Yes  SHORT TERM GOALS: Target date: 07/07/2023  Patient to be independent with initial HEP. Baseline: HEP initiated Goal status: MET    LONG TERM GOALS: Target date: 08/07/2023  Patient to be independent with advanced HEP. Baseline: Not yet initiated ; met for current 07/24/23 Goal status: IN PROGRESS 07/24/23  Patient to report 50% improvement in symptoms.  Baseline: pt reports increased symptoms for the past week 07/24/23 Goal status: IN PROGRESS  07/24/23  Patient will report tolerance for consistent walking/hiking program without symptoms limiting.  Baseline: reports only walking 2x/week max; Reports that d/t elevated symptoms for the past week, has not been able to walk her usual couple miles however has a stair stepping routine on days that she is not feeling as well 07/24/23 Goal status: IN PROGRESS 07/24/23  Patient to demonstrate mild-moderate sway with M-CTSIB condition with eyes closed/foam surface in order to improve safety in environments with uneven surfaces and dim lighting. Baseline: 5 sec> 16 sec 07/24/23 Goal status: IN PROGRESS 07/24/23  Patient to score at least 22/30 on FGA in order to decrease risk of falls. Baseline: 23>26 07/24/23 Goal status: MET 07/24/23   ASSESSMENT:  CLINICAL IMPRESSION: Patient arrived to session with report of improving tolerance for HEP. Reports that optokinetic exercises are still not bringing on symptoms, however she does notice dizziness with grocery stop shopping. Several activities were performed today, trying to bring on symptoms. Patient seemed to have most symptoms with dynamic head turns. HEP was updated to reflect this. No complaints at end of session.   OBJECTIVE IMPAIRMENTS: Abnormal gait, decreased activity tolerance, decreased balance, and dizziness.   ACTIVITY LIMITATIONS:  standing, stairs, bathing, self feeding, and locomotion level  PARTICIPATION LIMITATIONS: meal prep, cleaning, laundry, shopping, community activity, yard work, and church  PERSONAL FACTORS: Age, Past/current experiences, Time since onset of injury/illness/exacerbation, and 3+ comorbidities: Emphysema, GERD, L ankle fx surgery, B THA, R RTC repair   are also affecting patient's functional outcome.   REHAB POTENTIAL: Good  CLINICAL DECISION MAKING: Evolving/moderate complexity  EVALUATION COMPLEXITY: Moderate   PLAN:  PT FREQUENCY: 1x/week  PT DURATION: 2 weeks  PLANNED INTERVENTIONS: 97164- PT Re-evaluation, 97110-Therapeutic exercises, 97530- Therapeutic activity, 97112- Neuromuscular re-education, 97535- Self Care, 40347- Manual therapy, [phone]- Gait training, [phone]- Canalith repositioning, 97014- Electrical stimulation (unattended), Patient/Family education, Balance training, Stair training, Taping, Dry Needling, Joint mobilization, Spinal mobilization, Vestibular training, DME instructions, Cryotherapy, and Moist heat  PLAN FOR NEXT SESSION: continue with dynamic head turns; review optokinetic videos and try disco ball light again, more challenging multisensory balance exercises     Baldemar Friday, PT, DPT 07/28/23 4:10 PM  [facility] at [facility] [address] [place], [place] [address] Phone # [phone] Fax # [phone]

## 2023-07-28 ENCOUNTER — Ambulatory Visit: Payer: Medicare Other | Admitting: Physical Therapy

## 2023-07-28 ENCOUNTER — Encounter: Payer: Self-pay | Admitting: Physical Therapy

## 2023-07-28 DIAGNOSIS — R42 Dizziness and giddiness: Secondary | ICD-10-CM | POA: Diagnosis not present

## 2023-08-04 DIAGNOSIS — M5011 Cervical disc disorder with radiculopathy,  high cervical region: Secondary | ICD-10-CM | POA: Diagnosis not present

## 2023-08-04 DIAGNOSIS — M9901 Segmental and somatic dysfunction of cervical region: Secondary | ICD-10-CM | POA: Diagnosis not present

## 2023-08-04 NOTE — Therapy (Signed)
 OUTPATIENT PHYSICAL THERAPY VESTIBULAR NOTE     Patient Name: Beverly [name] MRN: [id number] DOB:Oct 23, 1945, 78 y.o., female Today's Date: 08/05/2023     END OF SESSION:  PT End of Session - 08/05/23 1526     Visit Number 9    Number of Visits 9    Date for PT Re-Evaluation 08/07/23    Authorization Type Medicare/Aetna    PT Start Time 1446    PT Stop Time 1525    PT Time Calculation (min) 39 min    Equipment Utilized During Treatment Gait belt    Activity Tolerance Patient tolerated treatment well    Behavior During Therapy WFL for tasks assessed/performed                    Past Medical History:  Diagnosis Date   Atypical mole 11/03/2013   R prox post lat thigh    Basal cell carcinoma    mid back   Basal cell carcinoma 04/01/2019   L chest parasternal    Basal cell carcinoma 09/11/2017   L mid back at braline 3.0 cm lat to spine   Emphysema lung (HCC)    Mild no symptoms Found on coronary CT scan   Esophageal dysmotility    GERD (gastroesophageal reflux disease)    occassional   OA (osteoarthritis)    hips and knees   Pneumonia    history of    PONV (postoperative nausea and vomiting)    Squamous cell carcinoma of skin 06/11/2021   L mid pretibia, [facility]   Past Surgical History:  Procedure Laterality Date   COLONOSCOPY     FRACTURE SURGERY  2009   left ankle   INGUINAL HERNIA REPAIR Right 08/22/2021   Procedure: OPEN RIGHT INGUINAL HERNIA REPAIR WITH MESH;  Surgeon: Berna Bue, MD;  Location: [facility];  Service: General;  Laterality: Right;   LAPAROSCOPY  1992   infection after tubal   SHOULDER OPEN ROTATOR CUFF REPAIR Right 2009   TOTAL HIP ARTHROPLASTY Left 01/29/2019   Procedure: LEFT TOTAL HIP ARTHROPLASTY ANTERIOR APPROACH;  Surgeon: Kathryne Hitch, MD;  Location: [facility];  Service: Orthopedics;  Laterality: Left;   TOTAL HIP ARTHROPLASTY Right 09/24/2019   Procedure: RIGHT TOTAL HIP ARTHROPLASTY ANTERIOR APPROACH;  Surgeon:  Kathryne Hitch, MD;  Location: [facility];  Service: Orthopedics;  Laterality: Right;   TUBAL LIGATION  1992   Patient Active Problem List   Diagnosis Date Noted   Restless leg 06/11/2023   Dizziness 03/25/2023   Gait abnormality 03/25/2023   Chronic insomnia 03/25/2023   Abnormal CT of the chest 02/08/2021   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   Primary osteoarthritis of left hip 12/07/2018   Primary osteoarthritis of right hip 12/07/2018   Pain of right hip joint 05/21/2017   Trochanteric bursitis, right hip 04/23/2017   Pain in right hip 04/23/2017   Hearing loss of right ear due to cerumen impaction 10/25/2016   Chronic rhinitis 07/04/2015   Cough 07/04/2015   Endometrial polyp 01/11/2011    PCP: Tally Joe, MD  REFERRING PROVIDER: Levert Feinstein, MD  REFERRING DIAG: R42 (ICD-10-CM) - Dizziness R26.9 (ICD-10-CM) - Gait abnormality F51.04 (ICD-10-CM) - Chronic insomnia  THERAPY DIAG:  Dizziness and giddiness  ONSET DATE: August 2024  Rationale for Evaluation and Treatment: Rehabilitation  SUBJECTIVE:   SUBJECTIVE STATEMENT: Reports that she has been really good lately. Reports that  she has been sleeping at night thanks to the CBD.    Pt accompanied by: self  PERTINENT HISTORY: Emphysema, GERD, L ankle fx surgery, B THA, R RTC repair   PAIN:  Are you having pain? No   PRECAUTIONS: Fall  RED FLAGS: None   WEIGHT BEARING RESTRICTIONS: No  FALLS: Has patient fallen in last 6 months? No  LIVING ENVIRONMENT: Lives with: lives with their spouse Lives in: House/apartment; townhome Stairs:  3 steps to enter; 2 story home Has following equipment at home: Single point cane, Environmental consultant - 2 wheeled, Crutches, and shower chair  PLOF: Independent; retired; enjoys hiking   PATIENT GOALS: improve balance and dizziness  OBJECTIVE:      TODAY'S TREATMENT: 08/06/23 Activity Comments   Review of HEP:  - Backward/fwd Tandem Walking with Counter Support  - Romberg Stance with Head Rotation and nods 2x30" - Standing Diagonal Chops with Medicine Ball  10x each side  - Gait with Head Nods and Turns  - Forward Walking Horizontal and vertical VOR Cancellation   - Alternating Step Forward/back with Support  Occasional UE support for tandem walk;  moderate sway with EC balance thus advised to slow down. Some imbalance with gait + head turns, however good stability with head nods. Pt reported mild dizziness with head turn gait. Mild instability with alt stepping- provided cues to slow down to increase challenge   Standing horizontal/vertical VOR 30"  Cueing to avoid end range movement; report of mild dizziness upon stopping horizontal, none with vertical      HOME EXERCISE PROGRAM Last updated: 08/05/23 Access Code: [email or web address] URL: [email or web address] Date: 08/05/2023 Prepared by: [facility] - Outpatient  Rehab - [facility]  Exercises - Backward Tandem Walking with Counter Support  - 1 x daily - 5 x weekly - 2 sets - 5 reps - Romberg Stance with Head Rotation  - 1 x daily - 5 x weekly - 2 sets - 30 sec hold - Gait with Head Nods and Turns on Grass  - 1 x daily - 5 x weekly - 2 sets - 4-5 reps - Forward Walking Horizontal VOR Cancellation  - 1 x daily - 5 x weekly - 2 sets - 4-5 reps - Alternating Step Forward with Support  - 1 x daily - 5 x weekly - 2 sets - 10 reps - Seated Gaze Stabilization with Head Rotation  - 1 x daily - 5 x weekly - 2-3 sets - 30 sec hold    PATIENT EDUCATION: Education details: HEP review and update, 30 day hold to allow for travel  Person educated: Patient Education method: Explanation, Demonstration, Tactile cues, Verbal cues, and Handouts Education comprehension: verbalized understanding and returned demonstration     Note: Objective measures were completed at Evaluation unless otherwise noted.  DIAGNOSTIC FINDINGS:  05/10/23: Normal head CT.   Per Dr. Pollyann Kennedy (ENT): Ears are healthy and clear. She definitely has some cerebellar signs possibly posterior column as well. There is no real treatment for this. She may benefit with some vestibular rehab. Will make a referral. Follow-up as needed.   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   COGNITION: Overall cognitive status: Within functional limits for tasks assessed   SENSATION: Denies N/T in UEs/LEs  COORDINATION: Very slight B hand tremor at rest Alternating pronation/supination: WNL B Alternating toe tap: WNL B Finger to nose: mild intention tremor and dysmetria B  POSTURE:  rounded shoulders and forward head  GAIT: Gait pattern: WNL, slightly slowed and wide BOS Assistive device utilized: None Level of assistance: Modified independence   PATIENT SURVEYS:  FOTO NT d/t time   VESTIBULAR ASSESSMENT:  GENERAL OBSERVATION: pt does not wear glasses (readers only)  OCULOMOTOR EXAM:  Ocular Alignment: normal  Ocular ROM: No Limitations  Spontaneous Nystagmus: absent  Gaze-Induced Nystagmus: absent  Smooth Pursuits: intact  Saccades: intact; reports mild dizziness   Convergence/Divergence: 3 cm   VESTIBULAR - OCULAR REFLEX:   Slow VOR: Normal (audible cavitation without pain)  VOR Cancellation: Normal  Head-Impulse Test: HIT Right: slightly positive HIT Left: slightly positive       POSITIONAL TESTING: NT                                                                                                                            TREATMENT DATE: 06/16/23  PATIENT EDUCATION: Education details: prognosis, POC, edu and handout on 3PD Person educated: Patient Education  method: Explanation, Demonstration, Tactile cues, Verbal cues, and Handouts Education comprehension: verbalized understanding  HOME EXERCISE PROGRAM:  GOALS: Goals reviewed with patient? Yes  SHORT TERM GOALS: Target date: 07/07/2023  Patient to be independent with initial HEP. Baseline: HEP initiated Goal status: MET    LONG TERM GOALS: Target date: 08/07/2023  Patient to be independent with advanced HEP. Baseline: Not yet initiated ; met for current 07/24/23 Goal status: IN PROGRESS 07/24/23  Patient to report 50% improvement in symptoms.  Baseline: pt reports increased symptoms for the past week 07/24/23 Goal status: IN PROGRESS  07/24/23  Patient will report tolerance for consistent walking/hiking program without symptoms limiting.  Baseline: reports only walking 2x/week max; Reports that d/t elevated symptoms for the past week, has not been able to walk her usual couple miles however has a stair stepping routine on days that she is not feeling as well 07/24/23 Goal status: IN PROGRESS 07/24/23  Patient to demonstrate mild-moderate sway with M-CTSIB condition with eyes closed/foam surface in order to improve safety in environments with uneven surfaces and dim lighting. Baseline: 5 sec> 16 sec 07/24/23 Goal status: IN PROGRESS 07/24/23  Patient to score at least 22/30 on FGA in order to decrease risk of falls. Baseline: 23>26 07/24/23 Goal status: MET 07/24/23   ASSESSMENT:  CLINICAL IMPRESSION: Patient arrived to session with report of improved symptoms recently; reports improved sleep as well. Session today focused on detailed HEP review and performance to allow for assessment of tolerance/benefit. Patient continues to demonstrate imbalance and increased dizziness with head turns vs. nods. HEP was updated with addition of horizontal VOR for increased practice. Patient reported understanding and without complaints upon leaving. Placing patient on 30 day hold to allow for travel.    OBJECTIVE IMPAIRMENTS: Abnormal gait, decreased activity  tolerance, decreased balance, and dizziness.   ACTIVITY LIMITATIONS: standing, stairs, bathing, self feeding, and locomotion level  PARTICIPATION LIMITATIONS: meal prep, cleaning, laundry, shopping, community activity, yard work, and church  PERSONAL FACTORS: Age, Past/current experiences, Time since onset of injury/illness/exacerbation, and 3+ comorbidities: Emphysema, GERD, L ankle fx surgery, B THA, R RTC repair   are also affecting patient's functional outcome.   REHAB POTENTIAL: Good  CLINICAL DECISION MAKING: Evolving/moderate complexity  EVALUATION COMPLEXITY: Moderate   PLAN:  PT FREQUENCY: 1x/week  PT DURATION: 2 weeks  PLANNED INTERVENTIONS: 97164- PT Re-evaluation, 97110-Therapeutic exercises, 97530- Therapeutic activity, 97112- Neuromuscular re-education, 97535- Self Care, 91478- Manual therapy, [phone]- Gait training, [phone]- Canalith repositioning, 97014- Electrical stimulation (unattended), Patient/Family education, Balance training, Stair training, Taping, Dry Needling, Joint mobilization, Spinal mobilization, Vestibular training, DME instructions, Cryotherapy, and Moist heat  PLAN FOR NEXT SESSION: continue with quick head turns and horizontal VOR; continue with dynamic head turns; review optokinetic videos and try disco ball light again, more challenging multisensory balance exercises     Baldemar Friday, PT, DPT 08/05/23 3:29 PM  [facility] at [facility] [address] [place], [place] [address] Phone # [phone] Fax # [phone]

## 2023-08-05 ENCOUNTER — Encounter: Payer: Self-pay | Admitting: Physical Therapy

## 2023-08-05 ENCOUNTER — Ambulatory Visit: Payer: Medicare Other | Attending: Family Medicine | Admitting: Physical Therapy

## 2023-08-05 DIAGNOSIS — R42 Dizziness and giddiness: Secondary | ICD-10-CM | POA: Insufficient documentation

## 2023-08-05 DIAGNOSIS — Z23 Encounter for immunization: Secondary | ICD-10-CM | POA: Diagnosis not present

## 2023-08-14 ENCOUNTER — Ambulatory Visit: Payer: Medicare Other | Admitting: Dermatology

## 2023-08-20 ENCOUNTER — Encounter: Payer: Self-pay | Admitting: Orthopedic Surgery

## 2023-08-21 NOTE — Telephone Encounter (Signed)
 I called and talked to her.  Her dizziness is better.  Bronchiectasis no problem.  I think she is leaning towards getting one of the knees fixed and then seeing how it does and then potentially getting the other 1 fixed 3 months later.  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  She is becoming less active because of this knee pain.  She may call when she gets back from [place] in early April to set something up.

## 2023-09-02 NOTE — Therapy (Signed)
 OUTPATIENT PHYSICAL THERAPY VESTIBULAR PROGRESS/DISCHARGE NOTE     Patient Name: Beverly [name] MRN: [id number] DOB:15-Apr-1946, 78 y.o., female Today's Date: 09/03/2023   Progress Note Reporting Period 06/16/23 to 09/03/23  See note below for Objective Data and Assessment of Progress/Goals.     END OF SESSION:  PT End of Session - 09/03/23 1434     Visit Number 10    Number of Visits 10    Date for PT Re-Evaluation 09/03/23    Authorization Type Medicare/[employer]    PT Start Time 1406    PT Stop Time 1429    PT Time Calculation (min) 23 min    Activity Tolerance Patient tolerated treatment well    Behavior During Therapy WFL for tasks assessed/performed                     Past Medical History:  Diagnosis Date   Atypical mole 11/03/2013   R prox post lat thigh    Basal cell carcinoma    mid back   Basal cell carcinoma 04/01/2019   L chest parasternal    Basal cell carcinoma 09/11/2017   L mid back at braline 3.0 cm lat to spine   Emphysema lung (HCC)    Mild no symptoms Found on coronary CT scan   Esophageal dysmotility    GERD (gastroesophageal reflux disease)    occassional   OA (osteoarthritis)    hips and knees   Pneumonia    history of    PONV (postoperative nausea and vomiting)    Squamous cell carcinoma of skin 06/11/2021   L mid pretibia, [facility]   Past Surgical History:  Procedure Laterality Date   COLONOSCOPY     FRACTURE SURGERY  2009   left ankle   INGUINAL HERNIA REPAIR Right 08/22/2021   Procedure: OPEN RIGHT INGUINAL HERNIA REPAIR WITH MESH;  Surgeon: Berna Bue, MD;  Location: [facility];  Service: General;  Laterality: Right;   LAPAROSCOPY  1992   infection after tubal   SHOULDER OPEN ROTATOR CUFF REPAIR Right 2009   TOTAL HIP ARTHROPLASTY Left 01/29/2019   Procedure: LEFT TOTAL HIP ARTHROPLASTY ANTERIOR APPROACH;  Surgeon: Kathryne Hitch, MD;  Location: [facility];  Service: Orthopedics;  Laterality: Left;   TOTAL HIP  ARTHROPLASTY Right 09/24/2019   Procedure: RIGHT TOTAL HIP ARTHROPLASTY ANTERIOR APPROACH;  Surgeon: Kathryne Hitch, MD;  Location: [facility];  Service: Orthopedics;  Laterality: Right;   TUBAL LIGATION  1992   Patient Active Problem List   Diagnosis Date Noted   Restless leg 06/11/2023   Dizziness 03/25/2023   Gait abnormality 03/25/2023   Chronic insomnia 03/25/2023   Abnormal CT of the chest 02/08/2021   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   Primary osteoarthritis of left hip 12/07/2018   Primary osteoarthritis of right hip 12/07/2018   Pain of right hip joint 05/21/2017   Trochanteric bursitis, right hip 04/23/2017   Pain in right hip 04/23/2017   Hearing loss of right ear due to cerumen impaction 10/25/2016   Chronic rhinitis 07/04/2015   Cough 07/04/2015   Endometrial polyp 01/11/2011    PCP: Tally Joe, MD  REFERRING PROVIDER: Levert Feinstein, MD  REFERRING DIAG: R42 (ICD-10-CM) - Dizziness R26.9 (ICD-10-CM) - Gait abnormality F51.04 (ICD-10-CM) - Chronic insomnia  THERAPY DIAG:  Dizziness and giddiness  ONSET DATE: August 2024  Rationale for Evaluation and Treatment: Rehabilitation  SUBJECTIVE:  SUBJECTIVE STATEMENT: Doing great. Has a good 3 weeks and feel like I'm recovering. Reports that she occasionally gets imbalance. Reports that she has a goal to lose 10 lbs d/t gaining some weight since having this dizziness. Reports that when she has nausea it is usually better when she eats. However, reports that dizziness is much better. Reports that she is looking into pursuing knee surgery. Patient reports that she has been grocery shopping multiple times without dizziness. pt reports 85-90% improvement in symptoms. Pt reports no dizziness when going on hikes now, but feels some imbalance.    Pt accompanied by: self  PERTINENT HISTORY: Emphysema, GERD, L ankle fx surgery, B THA, R RTC  repair   PAIN:  Are you having pain? No   PRECAUTIONS: Fall  RED FLAGS: None   WEIGHT BEARING RESTRICTIONS: No  FALLS: Has patient fallen in last 6 months? No  LIVING ENVIRONMENT: Lives with: lives with their spouse Lives in: House/apartment; townhome Stairs:  3 steps to enter; 2 story home Has following equipment at home: Single point cane, Environmental consultant - 2 wheeled, Crutches, and shower chair  PLOF: Independent; retired; enjoys hiking   PATIENT GOALS: improve balance and dizziness  OBJECTIVE:     TODAY'S TREATMENT: 09/03/23   PATIENT EDUCATION: Education details: discussion on progress towards goals, remaining impairments, current abilities; reviewed and consolidated HEP for focus on balance, edu on how to habituate getting down to/up from floor by working on sitting bending habituation as she reports not yet returning back to floor exercises, DC Person educated: Patient Education method: Explanation, Demonstration, Tactile cues, Verbal cues, and Handouts Education comprehension: verbalized understanding and returned demonstration  HOME EXERCISE PROGRAM Last updated: 09/03/23 Access Code: [email or web address] URL: [email or web address] Date: 09/03/2023 Prepared by: [facility] - Outpatient  Rehab - [facility]  Exercises - Backward Tandem Walking with Counter Support  - 1 x daily - 5 x weekly - 2 sets - 5 reps - Romberg Stance with Head Rotation  - 1 x daily - 5 x weekly - 2 sets - 30 sec hold - Alternating Step Forward with Support  - 1 x daily - 5 x weekly - 2 sets - 10 reps      Note: Objective measures were completed at Evaluation unless otherwise noted.  DIAGNOSTIC FINDINGS: 05/10/23: Normal head CT.   Per Dr. Pollyann Kennedy (ENT): Ears are healthy and clear. She definitely has some cerebellar signs possibly posterior column as well. There is no real treatment for this. She may benefit with some vestibular rehab. Will make a referral. Follow-up as needed.    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   COGNITION: Overall cognitive status: Within functional limits for tasks assessed   SENSATION: Denies N/T in UEs/LEs    COORDINATION: Very slight B hand tremor at rest Alternating pronation/supination: WNL B Alternating toe tap: WNL B Finger to nose: mild intention tremor and dysmetria B  POSTURE:  rounded shoulders and forward head  GAIT: Gait pattern: WNL, slightly slowed and wide BOS Assistive device utilized: None Level of assistance: Modified independence   PATIENT SURVEYS:  FOTO NT d/t time   VESTIBULAR ASSESSMENT:  GENERAL OBSERVATION: pt does not wear glasses (readers only)  OCULOMOTOR EXAM:  Ocular Alignment: normal  Ocular ROM: No Limitations  Spontaneous Nystagmus: absent  Gaze-Induced Nystagmus: absent  Smooth Pursuits: intact  Saccades: intact; reports mild dizziness   Convergence/Divergence: 3 cm   VESTIBULAR - OCULAR REFLEX:   Slow VOR: Normal (audible cavitation without pain)  VOR Cancellation: Normal  Head-Impulse Test: HIT Right: slightly positive HIT Left: slightly positive       POSITIONAL TESTING: NT                                                                                                                            TREATMENT DATE: 06/16/23  PATIENT EDUCATION: Education details: prognosis, POC, edu and handout on 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 Yes  SHORT TERM GOALS: Target date: 07/07/2023  Patient to be independent with  initial HEP. Baseline: HEP initiated Goal status: MET    LONG TERM GOALS: Target date: 08/07/2023  Patient to be independent with advanced HEP. Baseline: Not yet initiated ; met for current 07/24/23; met for current 09/03/23 Goal status: MET 09/03/23  Patient to report 50% improvement in symptoms.  Baseline: pt reports increased symptoms for the past week 07/24/23; pt reports 85-90% improvement in symptoms 09/03/23 Goal status: MET 09/03/23  Patient will report tolerance for consistent walking/hiking program without symptoms limiting.  Baseline: reports only walking 2x/week max; Reports that d/t elevated symptoms for the past week, has not been able to walk her usual couple miles however has a stair stepping routine on days that she is not feeling as well 07/24/23; Pt reports no dizziness when going on hikes now, but feels some imbalance. Reports plans to return back to consistent walking 09/03/23 Goal status: PARTIALLY MET 09/03/23   Patient to demonstrate mild-moderate sway with M-CTSIB condition with eyes closed/foam surface in order to improve safety in environments with uneven surfaces and dim lighting. Baseline: 5 sec> 16 sec 07/24/23 Goal status: NOT MET 07/24/23  Patient to score at least 22/30 on FGA in order to decrease risk of falls. Baseline: 23>26 07/24/23 Goal status: MET 07/24/23   ASSESSMENT:  CLINICAL IMPRESSION: Patient arrived to session after break from therapy d/t travel. Reports 85-90% improvement in her symptoms since start of POC. Reports that she has been able to shop and go hiking without dizziness. Reports occasional remaining imbalance; as expected d/t B vestibular hypofunction. HEP was consolidated for focus on balance and educated patient on habituating other movements that she has not yet returned back to. NO complaints at end of session. Patient is ready for D/C d/t improvement in symptoms.   OBJECTIVE IMPAIRMENTS: Abnormal gait, decreased activity tolerance, decreased  balance, and dizziness.   ACTIVITY LIMITATIONS: standing, stairs, bathing, self feeding, and locomotion level  PARTICIPATION LIMITATIONS: meal prep, cleaning, laundry, shopping, community activity, yard work, and church  PERSONAL FACTORS: Age, Past/current experiences, Time since onset of injury/illness/exacerbation, and 3+ comorbidities: Emphysema, GERD, L ankle fx  surgery, B THA, R RTC repair   are also affecting patient's functional outcome.   REHAB POTENTIAL: Good  CLINICAL DECISION MAKING: Evolving/moderate complexity  EVALUATION COMPLEXITY: Moderate   PLAN:  PT FREQUENCY: 1x/week  PT DURATION: 2 weeks  PLANNED INTERVENTIONS: 97164- PT Re-evaluation, 97110-Therapeutic exercises, 97530- Therapeutic activity, 97112- Neuromuscular re-education, 97535- Self Care, 95188- Manual therapy, [phone]- Gait training, [phone]- Canalith repositioning, 97014- Electrical stimulation (unattended), Patient/Family education, Balance training, Stair training, Taping, Dry Needling, Joint mobilization, Spinal mobilization, Vestibular training, DME instructions, Cryotherapy, and Moist heat  PLAN FOR NEXT SESSION: DC at this time   PHYSICAL THERAPY DISCHARGE SUMMARY  Visits from Start of Care: 10  Current functional level related to goals / functional outcomes: See above clinical impression    Remaining deficits: Imbalance    Education / Equipment: HEP  Plan: Patient agrees to discharge.  Patient goals were partially met. Patient is being discharged due to meeting the stated rehab goals.          Baldemar Friday, PT, DPT 09/03/23 2:35 PM  [facility] at [facility] [address] [place], [address] [place], [place] [address] Phone # [phone] Fax # [phone]

## 2023-09-03 ENCOUNTER — Ambulatory Visit: Payer: Self-pay | Attending: Family Medicine | Admitting: Physical Therapy

## 2023-09-03 ENCOUNTER — Encounter: Payer: Self-pay | Admitting: Physical Therapy

## 2023-09-03 DIAGNOSIS — R42 Dizziness and giddiness: Secondary | ICD-10-CM | POA: Diagnosis not present

## 2023-09-10 DIAGNOSIS — M9901 Segmental and somatic dysfunction of cervical region: Secondary | ICD-10-CM | POA: Diagnosis not present

## 2023-09-10 DIAGNOSIS — M5011 Cervical disc disorder with radiculopathy,  high cervical region: Secondary | ICD-10-CM | POA: Diagnosis not present

## 2023-09-11 NOTE — Progress Notes (Signed)
 Surgical Instructions   Your procedure is scheduled on Thursday, April 17th, 2025. Report to [facility] Main Entrance "A" at 5:30 A.M., then check in with the Admitting office. Any questions or running late day of surgery: call [phone]  Questions prior to your surgery date: call [phone], Monday-Friday, 8am-4pm. If you experience any cold or flu symptoms such as cough, fever, chills, shortness of breath, etc. between now and your scheduled surgery, please notify us at the above number.     Remember:  Do not eat after midnight the night before your surgery  You may drink clear liquids until 4:30 the morning of your surgery.   Clear liquids allowed are: Water, Non-Citrus Juices (without pulp), Carbonated Beverages, Clear Tea (no milk, honey, etc.), Black Coffee Only (NO MILK, CREAM OR POWDERED CREAMER of any kind), and Gatorade.  Patient Instructions  The night before surgery:  No food after midnight. ONLY clear liquids after midnight  The day of surgery (if you do NOT have diabetes):  Drink ONE (1) Pre-Surgery Clear Ensure by 4:30 the morning of surgery. Drink in one sitting. Do not sip.  This drink was given to you during your hospital  pre-op appointment visit.  Nothing else to drink after completing the  Pre-Surgery Clear Ensure.          If you have questions, please contact your surgeon's office.     Take these medicines the morning of surgery with A SIP OF WATER: Pantoprazole (Protonix)   May take these medicines IF NEEDED:    One week prior to surgery, STOP taking any Aspirin (unless otherwise instructed by your surgeon) Aleve, Naproxen, Ibuprofen, Motrin, Advil, Goody's, BC's, all herbal medications, fish oil, and non-prescription vitamins.                     Do NOT Smoke (Tobacco/Vaping) for 24 hours prior to your procedure.  If you use a CPAP at night, you may bring your mask/headgear for your overnight stay.   You will be asked to remove any  contacts, glasses, piercing's, hearing aid's, dentures/partials prior to surgery. Please bring cases for these items if needed.    Patients discharged the day of surgery will not be allowed to drive home, and someone needs to stay with them for 24 hours.  SURGICAL WAITING ROOM VISITATION Patients may have no more than 2 support people in the waiting area - these visitors may rotate.   Pre-op nurse will coordinate an appropriate time for 1 ADULT support person, who may not rotate, to accompany patient in pre-op.  Children under the age of 60 must have an adult with them who is not the patient and must remain in the main waiting area with an adult.  If the patient needs to stay at the hospital during part of their recovery, the visitor guidelines for inpatient rooms apply.  Please refer to the [facility] website for the visitor guidelines for any additional information.   If you received a COVID test during your pre-op visit  it is requested that you wear a mask when out in public, stay away from anyone that may not be feeling well and notify your surgeon if you develop symptoms. If you have been in contact with anyone that has tested positive in the last 10 days please notify you surgeon.      Pre-operative 5 CHG Bathing Instructions   You can play a key role in reducing the risk of infection after surgery. Your  skin needs to be as free of germs as possible. You can reduce the number of germs on your skin by washing with CHG (chlorhexidine gluconate) soap before surgery. CHG is an antiseptic soap that kills germs and continues to kill germs even after washing.   DO NOT use if you have an allergy to chlorhexidine/CHG or antibacterial soaps. If your skin becomes reddened or irritated, stop using the CHG and notify one of our RNs at [phone].   Please shower with the CHG soap starting 4 days before surgery using the following schedule:     Please keep in mind the following:  DO NOT  shave, including legs and underarms, starting the day of your first shower.   You may shave your face at any point before/day of surgery.  Place clean sheets on your bed the day you start using CHG soap. Use a clean washcloth (not used since being washed) for each shower. DO NOT sleep with pets once you start using the CHG.   CHG Shower Instructions:  Wash your face and private area with normal soap. If you choose to wash your hair, wash first with your normal shampoo.  After you use shampoo/soap, rinse your hair and body thoroughly to remove shampoo/soap residue.  Turn the water OFF and apply about 3 tablespoons (45 ml) of CHG soap to a CLEAN washcloth.  Apply CHG soap ONLY FROM YOUR NECK DOWN TO YOUR TOES (washing for 3-5 minutes)  DO NOT use CHG soap on face, private areas, open wounds, or sores.  Pay special attention to the area where your surgery is being performed.  If you are having back surgery, having someone wash your back for you may be helpful. Wait 2 minutes after CHG soap is applied, then you may rinse off the CHG soap.  Pat dry with a clean towel  Put on clean clothes/pajamas   If you choose to wear lotion, please use ONLY the CHG-compatible lotions that are listed below.  Additional instructions for the day of surgery: DO NOT APPLY any lotions, deodorants, cologne, or perfumes.   Do not bring valuables to the hospital. [facility] is not responsible for any belongings/valuables. Do not wear nail polish, gel polish, artificial nails, or any other type of covering on natural nails (fingers and toes) Do not wear jewelry or makeup Put on clean/comfortable clothes.  Please brush your teeth.  Ask your nurse before applying any prescription medications to the skin.     CHG Compatible Lotions   Aveeno Moisturizing lotion  Cetaphil Moisturizing Cream  Cetaphil Moisturizing Lotion  Clairol Herbal Essence Moisturizing Lotion, Dry Skin  Clairol Herbal Essence Moisturizing  Lotion, Extra Dry Skin  Clairol Herbal Essence Moisturizing Lotion, Normal Skin  Curel Age Defying Therapeutic Moisturizing Lotion with Alpha Hydroxy  Curel Extreme Care Body Lotion  Curel Soothing Hands Moisturizing Hand Lotion  Curel Therapeutic Moisturizing Cream, Fragrance-Free  Curel Therapeutic Moisturizing Lotion, Fragrance-Free  Curel Therapeutic Moisturizing Lotion, Original Formula  Eucerin Daily Replenishing Lotion  Eucerin Dry Skin Therapy Plus Alpha Hydroxy Crme  Eucerin Dry Skin Therapy Plus Alpha Hydroxy Lotion  Eucerin Original Crme  Eucerin Original Lotion  Eucerin Plus Crme Eucerin Plus Lotion  Eucerin TriLipid Replenishing Lotion  Keri Anti-Bacterial Hand Lotion  Keri Deep Conditioning Original Lotion Dry Skin Formula Softly Scented  Keri Deep Conditioning Original Lotion, Fragrance Free Sensitive Skin Formula  Keri Lotion Fast Absorbing Fragrance Free Sensitive Skin Formula  Keri Lotion Fast Absorbing Softly Scented Dry Skin Formula  Keri Original Lotion  [employer] Skin Renewal Lotion Keri Silky Smooth Lotion  Keri Silky Smooth Sensitive Skin Lotion  Nivea Body Creamy Conditioning Patent examiner Moisturizing Lotion Nivea Crme  Nivea Skin Firming Lotion  NutraDerm 30 Skin Lotion  NutraDerm Skin Lotion  NutraDerm Therapeutic Skin Cream  NutraDerm Therapeutic Skin Lotion  ProShield Protective Hand Cream  Provon moisturizing lotion  Please read over the following fact sheets that you were given.

## 2023-09-12 ENCOUNTER — Other Ambulatory Visit: Payer: Self-pay

## 2023-09-12 ENCOUNTER — Encounter (HOSPITAL_COMMUNITY): Payer: Self-pay | Admitting: Orthopedic Surgery

## 2023-09-12 ENCOUNTER — Encounter (HOSPITAL_COMMUNITY)
Admission: RE | Admit: 2023-09-12 | Discharge: 2023-09-12 | Disposition: A | Discharge status: Home / Self Care | Source: Ambulatory Visit | Attending: Orthopedic Surgery | Admitting: Orthopedic Surgery

## 2023-09-12 DIAGNOSIS — Z01818 Encounter for other preprocedural examination: Secondary | ICD-10-CM | POA: Diagnosis present

## 2023-09-12 DIAGNOSIS — K219 Gastro-esophageal reflux disease without esophagitis: Secondary | ICD-10-CM | POA: Insufficient documentation

## 2023-09-12 DIAGNOSIS — M1711 Unilateral primary osteoarthritis, right knee: Secondary | ICD-10-CM | POA: Diagnosis not present

## 2023-09-12 DIAGNOSIS — Z87891 Personal history of nicotine dependence: Secondary | ICD-10-CM | POA: Diagnosis not present

## 2023-09-12 DIAGNOSIS — Z01812 Encounter for preprocedural laboratory examination: Secondary | ICD-10-CM | POA: Diagnosis not present

## 2023-09-12 DIAGNOSIS — Z96643 Presence of artificial hip joint, bilateral: Secondary | ICD-10-CM | POA: Diagnosis not present

## 2023-09-12 DIAGNOSIS — K449 Diaphragmatic hernia without obstruction or gangrene: Secondary | ICD-10-CM | POA: Diagnosis not present

## 2023-09-12 DIAGNOSIS — Z85828 Personal history of other malignant neoplasm of skin: Secondary | ICD-10-CM | POA: Diagnosis not present

## 2023-09-12 LAB — URINALYSIS, W/ REFLEX TO CULTURE (INFECTION SUSPECTED)
Bacteria, UA: NONE SEEN
Bilirubin Urine: NEGATIVE
Glucose, UA: NEGATIVE mg/dL
Hgb urine dipstick: NEGATIVE
Ketones, ur: NEGATIVE mg/dL
Leukocytes,Ua: NEGATIVE
Nitrite: NEGATIVE
Protein, ur: NEGATIVE mg/dL
Specific Gravity, Urine: 1.006 (ref 1.005–1.030)
pH: 7 (ref 5.0–8.0)

## 2023-09-12 LAB — BASIC METABOLIC PANEL WITH GFR
Anion gap: 9 (ref 5–15)
BUN: 16 mg/dL (ref 8–23)
CO2: 26 mmol/L (ref 22–32)
Calcium: 9.1 mg/dL (ref 8.9–10.3)
Chloride: 102 mmol/L (ref 98–111)
Creatinine, Ser: 0.68 mg/dL (ref 0.44–1.00)
GFR, Estimated: >60 mL/min (ref >60)
Glucose, Bld: 105 mg/dL — ABNORMAL HIGH (ref 70–99)
Potassium: 3.9 mmol/L (ref 3.5–5.1)
Sodium: 137 mmol/L (ref 135–145)

## 2023-09-12 LAB — CBC
HCT: 43.7 % (ref 36.0–46.0)
Hemoglobin: 14.4 g/dL (ref 12.0–15.0)
MCH: 27.5 pg (ref 26.0–34.0)
MCHC: 33 g/dL (ref 30.0–36.0)
MCV: 83.4 fL (ref 80.0–100.0)
Platelets: 266 10*3/uL (ref 150–400)
RBC: 5.24 MIL/uL — ABNORMAL HIGH (ref 3.87–5.11)
RDW: 13.9 % (ref 11.5–15.5)
WBC: 8.7 10*3/uL (ref 4.0–10.5)
nRBC: 0 % (ref 0.0–0.2)

## 2023-09-12 LAB — SURGICAL PCR SCREEN
MRSA, PCR: NEGATIVE
Staphylococcus aureus: NEGATIVE

## 2023-09-12 NOTE — Pre-Procedure Instructions (Signed)
 PCP - Tally Joe, Md Cardiologist - denies Pulmonologist- Rosanne Ashing   PPM/ICD - denies Device Orders - n/a Rep Notified - n/a  Chest x-ray - 02/24/2023 (CT) EKG - 05/13/2023 Stress Test -denies  ECHO - denies Cardiac Cath - denies  Sleep Study - denies CPAP - n/a  Fasting Blood Sugar - no DM Checks Blood Sugar _____ times a day  Last dose of GLP1 agonist-  n/a GLP1 instructions: n/a  Blood Thinner Instructions: n/a Aspirin Instructions: n/a  ERAS Protcol -yes, till 0430AM PRE-SURGERY Ensure or G2- yes  COVID TEST- n/a   Anesthesia review: yes, hx of COPD.   Patient denies shortness of breath, fever, cough and chest pain at PAT appointment   All instructions explained to the patient, with a verbal understanding of the material. Patient agrees to go over the instructions while at home for a better understanding. Patient also instructed to self quarantine after being tested for COVID-19. The opportunity to ask questions was provided.

## 2023-09-15 NOTE — Progress Notes (Signed)
 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  History includes former smoker (quit 12/27/79), post-operative N/V, plus edema, hiatal hernia, GERD, skin cancer (BCC, SCC), osteoarthritis (left THA 01/29/19; right THA 09/24/19), hernia (right IHR 08/22/21).   Recent evaluation by neurology and ENT for dizziness/balance issues. As needed follow-up recommended. Dizziness "better" per Dr. August Saucer when he spoke with her before posting case. She has mild bronchiectasis on prior CT. Coughing overall improved after course of Augmentin. Normal spirometry 03/2023. Pulmonologist advised as needed follow-up at 04/23/23 visit.  05/07/23 EKG showed NSR.   Anesthesia team to evaluate on the day of surgery.   VS: BP (!) 152/89   Pulse 75   Temp 36.4 C   Resp 16   Ht 5\' 7"  (1.702 m)   Wt 63.7 kg   SpO2 100%   BMI 21.99 kg/m   PROVIDERS: Tally Joe, MD is PCP Levert Feinstein, MD is neurologist Serena Colonel, MD is ENT. Consider vestibular rehab for balance issues. Had already had neurology evaluation. As needed follow-up at 05/08/23 visit.  Rosanne Ashing, MD is pulmonologist. As needed follow-up at 04/23/23 visit for chronic cough, mostly resolved post Augmentin.    LABS: Labs in [facility] from 09/12/23 include: Lab Results  Component Value Date   WBC 8.7 09/12/2023   HGB 14.4 09/12/2023   HCT 43.7 09/12/2023   PLT 266 09/12/2023   GLUCOSE 105 (H) 09/12/2023   ALT 22 02/24/2013   AST 23 02/24/2013   NA 137 09/12/2023   K 3.9 09/12/2023   CL 102 09/12/2023   CREATININE 0.68 09/12/2023   BUN 16 09/12/2023   CO2 26 09/12/2023   TSH 2.570  06/11/2023     OTHER: Spirometry 03/06/23 ([facility]): Summary  Normal spirometry.   METHACHOLINE CHALLENGE  Patient reached level 5 at dose 4.0 mg with no resulting FEV1 PD20. Results do not show bronchial hyperresponsiveness. Patient discharged with FEV 1 within 10% of control.    Walk Test 10/04/22 ([facility]): At rest:  HR - At Rest: 68  BP - At Rest: 154/86  At Rest SpO2: 100 %  At Rest O2 Device: None (Room air)   During exertion:  HR - Peak: 87  HR - 1 Minute Recovery: 66  HR - Age Predicted Max (%): 56.2  BP - Test End: 145/79  With Ambulation/Activity SpO2: 97 %    IMAGES: CT Head 05/10/23: IMPRESSION: Normal head CT.   MRI C-spine 04/06/23: IMPRESSION: MRI scan of cervical spine without contrast showing prominent spondylitic changes from C3-C7 most prominent at C5-6 where there is broad-based disc osteophyte protrusion with mild canal and left greater than right severe foraminal narrowing possible impingement of the exiting nerve root on the left.   CT Chest 02/24/23: IMPRESSION: 1. Chronic areas of bronchiectasis in the anterior right upper lobe, right middle lobe and lingula with areas of volume loss and peribronchial opacity, overall mild improvement from prior exam. Lesser areas of bronchiectasis and peribronchovascular nodularity within the lower lobes, with definite basilar improvement. 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 2. Small hiatal hernia. 3. Mild emphysema.  EKG: 05/07/23: NSR   CV: CT Cardiac Scoring 11/16/20:   FINDINGS: CORONARY CALCIUM SCORES: Left Main: 0 LAD: 6 LCx: 0 RCA: 0   Total Agatston Score: 6 MESA database percentile: 33rd   AORTA MEASUREMENTS: Ascending Aorta: 29 mm Descending Aorta: 22 mm  IMPRESSION: 1. Patient's total coronary artery calcium score is 6 which is 33rd percentile for patient's of matched age, gender and race/ethnicity. Please note that although the presence of  coronary artery calcium documents the presence of coronary artery disease, the severity of this disease and any potential stenosis cannot be assessed on this noncontrast CT examination. Assessment for potential risk factor modification, dietary therapy or pharmacologic therapy may be warranted, if clinically indicated.  Past Medical History:  Diagnosis Date   Atypical mole 11/03/2013   R prox post lat thigh    Basal cell carcinoma    mid back   Basal cell carcinoma 04/01/2019   L chest parasternal    Basal cell carcinoma 09/11/2017   L mid back at braline 3.0 cm lat to spine   Emphysema lung (HCC)    Mild no symptoms Found on coronary CT scan   Esophageal dysmotility    GERD (gastroesophageal reflux disease)    occassional   History of hiatal hernia    OA (osteoarthritis)    hips and knees   Pneumonia    history of    PONV (postoperative nausea and vomiting)    Squamous cell carcinoma of skin 06/11/2021   L mid pretibia, [facility]    Past Surgical History:  Procedure Laterality Date   COLONOSCOPY     FRACTURE SURGERY  2009   left ankle   INGUINAL HERNIA REPAIR Right 08/22/2021   Procedure: OPEN RIGHT INGUINAL HERNIA REPAIR WITH MESH;  Surgeon: Adalberto Acton, MD;  Location: [facility];  Service: General;  Laterality: Right;   LAPAROSCOPY  1992   infection after tubal   SHOULDER OPEN ROTATOR CUFF REPAIR Right 2009   TOTAL HIP ARTHROPLASTY Left 01/29/2019   Procedure: LEFT TOTAL HIP ARTHROPLASTY ANTERIOR APPROACH;  Surgeon: Arnie Lao, MD;  Location: [facility];  Service: Orthopedics;  Laterality: Left;   TOTAL HIP ARTHROPLASTY Right 09/24/2019   Procedure: RIGHT TOTAL HIP ARTHROPLASTY ANTERIOR APPROACH;  Surgeon: Arnie Lao, MD;  Location: [facility];  Service: Orthopedics;  Laterality: Right;   TUBAL LIGATION  1992    MEDICATIONS:  bimatoprost (LATISSE) 0.03 % ophthalmic solution   Calcium Carb-Cholecalciferol (CALCIUM 600 + D PO)   estradiol (VIVELLE-DOT)  0.025 MG/24HR   mometasone (ELOCON) 0.1 % cream   Multiple Vitamins-Minerals (MULTIVITAMIN WITH MINERALS) tablet   Oil of Oregano 1500 MG CAPS   OVER THE COUNTER MEDICATION   pantoprazole (PROTONIX) 40 MG tablet   pravastatin (PRAVACHOL) 10 MG tablet   Probiotic Product (PROBIOTIC PO)   progesterone (PROMETRIUM) 100 MG capsule   psyllium (METAMUCIL) 58.6 % powder   vitamin B-12 (CYANOCOBALAMIN) 1000 MCG tablet   No current facility-administered medications for this encounter.    Ella Gun, PA-C Surgical Short Stay/Anesthesiology [facility] Phone [phone] [facility] Phone [phone] 09/15/2023 3:14 PM

## 2023-09-15 NOTE — Telephone Encounter (Signed)
 Fasting I called.  In general she is good.  Not having any fevers or chills.  She has good ability to get up and down stairs and walk around.  I will call her again tomorrow so we can double check for sure that she is ago.

## 2023-09-15 NOTE — Anesthesia Preprocedure Evaluation (Signed)
 Anesthesia Evaluation    Airway        Dental   Pulmonary former smoker          Cardiovascular      Neuro/Psych    GI/Hepatic   Endo/Other    Renal/GU      Musculoskeletal   Abdominal   Peds  Hematology   Anesthesia Other Findings   Reproductive/Obstetrics                             Anesthesia Physical Anesthesia Plan  ASA:   Anesthesia Plan:    Post-op Pain Management:    Induction:   PONV Risk Score and Plan:   Airway Management Planned:   Additional Equipment:   Intra-op Plan:   Post-operative Plan:   Informed Consent:   Plan Discussed with:   Anesthesia Plan Comments: (PAT note written 09/15/2023 by Taha Dimond, PA-C.  )       Anesthesia Quick Evaluation

## 2023-09-16 DIAGNOSIS — I700 Atherosclerosis of aorta: Secondary | ICD-10-CM | POA: Diagnosis not present

## 2023-09-16 DIAGNOSIS — J439 Emphysema, unspecified: Secondary | ICD-10-CM | POA: Diagnosis not present

## 2023-09-16 DIAGNOSIS — I2510 Atherosclerotic heart disease of native coronary artery without angina pectoris: Secondary | ICD-10-CM | POA: Diagnosis not present

## 2023-09-16 NOTE — Telephone Encounter (Signed)
 I called.  She is not feeling 100%.  Would like to put her off for about 3 weeks.  Stephenie Einstein can you call her in about 2 weeks see how she is feeling and then get her back on the schedule.  Thanks.

## 2023-09-18 ENCOUNTER — Ambulatory Visit (HOSPITAL_COMMUNITY): Admission: RE | Admit: 2023-09-18 | Source: Home / Self Care | Admitting: Orthopedic Surgery

## 2023-09-18 ENCOUNTER — Encounter (HOSPITAL_COMMUNITY): Payer: Self-pay | Admitting: Vascular Surgery

## 2023-09-18 ENCOUNTER — Encounter (HOSPITAL_COMMUNITY): Admission: RE | Payer: Self-pay | Source: Home / Self Care

## 2023-09-18 ENCOUNTER — Encounter (HOSPITAL_COMMUNITY): Payer: Self-pay | Admitting: Anesthesiology

## 2023-09-18 DIAGNOSIS — Z01818 Encounter for other preprocedural examination: Secondary | ICD-10-CM

## 2023-09-18 HISTORY — DX: Personal history of other diseases of the digestive system: Z87.19

## 2023-09-18 SURGERY — ARTHROPLASTY, PATELLOFEMORAL
Anesthesia: Choice | Site: Knee | Laterality: Right

## 2023-10-01 ENCOUNTER — Other Ambulatory Visit: Payer: Self-pay | Admitting: Dermatology

## 2023-10-01 DIAGNOSIS — J439 Emphysema, unspecified: Secondary | ICD-10-CM | POA: Diagnosis not present

## 2023-10-01 DIAGNOSIS — I700 Atherosclerosis of aorta: Secondary | ICD-10-CM | POA: Diagnosis not present

## 2023-10-01 DIAGNOSIS — E7800 Pure hypercholesterolemia, unspecified: Secondary | ICD-10-CM | POA: Diagnosis not present

## 2023-10-01 DIAGNOSIS — I2510 Atherosclerotic heart disease of native coronary artery without angina pectoris: Secondary | ICD-10-CM | POA: Diagnosis not present

## 2023-10-03 ENCOUNTER — Encounter: Admitting: Surgical

## 2023-10-07 ENCOUNTER — Encounter (HOSPITAL_COMMUNITY)
Admission: RE | Disposition: A | Discharge status: Home / Self Care | Payer: Self-pay | Source: Home / Self Care | Attending: Orthopedic Surgery

## 2023-10-07 ENCOUNTER — Encounter (HOSPITAL_COMMUNITY): Payer: Self-pay | Admitting: Orthopedic Surgery

## 2023-10-07 ENCOUNTER — Observation Stay (HOSPITAL_COMMUNITY)
Admission: RE | Admit: 2023-10-07 | Discharge: 2023-10-08 | Disposition: A | Discharge status: Home / Self Care | Attending: Orthopedic Surgery | Admitting: Orthopedic Surgery

## 2023-10-07 ENCOUNTER — Ambulatory Visit (HOSPITAL_COMMUNITY): Payer: Self-pay | Admitting: Anesthesiology

## 2023-10-07 ENCOUNTER — Other Ambulatory Visit: Payer: Self-pay

## 2023-10-07 DIAGNOSIS — M1711 Unilateral primary osteoarthritis, right knee: Secondary | ICD-10-CM | POA: Diagnosis not present

## 2023-10-07 DIAGNOSIS — Z96643 Presence of artificial hip joint, bilateral: Secondary | ICD-10-CM | POA: Insufficient documentation

## 2023-10-07 DIAGNOSIS — M222X1 Patellofemoral disorders, right knee: Principal | ICD-10-CM | POA: Insufficient documentation

## 2023-10-07 DIAGNOSIS — Z01818 Encounter for other preprocedural examination: Principal | ICD-10-CM

## 2023-10-07 DIAGNOSIS — Z96651 Presence of right artificial knee joint: Secondary | ICD-10-CM

## 2023-10-07 DIAGNOSIS — Z85828 Personal history of other malignant neoplasm of skin: Secondary | ICD-10-CM | POA: Diagnosis not present

## 2023-10-07 DIAGNOSIS — Z79899 Other long term (current) drug therapy: Secondary | ICD-10-CM | POA: Insufficient documentation

## 2023-10-07 DIAGNOSIS — M2241 Chondromalacia patellae, right knee: Secondary | ICD-10-CM | POA: Diagnosis not present

## 2023-10-07 DIAGNOSIS — G8918 Other acute postprocedural pain: Secondary | ICD-10-CM | POA: Diagnosis not present

## 2023-10-07 HISTORY — PX: PATELLA-FEMORAL ARTHROPLASTY: SHX5037

## 2023-10-07 SURGERY — ARTHROPLASTY, PATELLOFEMORAL
Anesthesia: General | Site: Knee | Laterality: Right

## 2023-10-07 SURGERY — ARTHROPLASTY, PATELLOFEMORAL
Anesthesia: Choice | Site: Knee | Laterality: Right

## 2023-10-07 MED ORDER — OXYCODONE HCL 5 MG PO TABS
ORAL_TABLET | ORAL | Status: AC
  Administered 2023-10-07: 10 mg via ORAL
  Filled 2023-10-07: qty 1

## 2023-10-07 MED ORDER — VANCOMYCIN HCL 1000 MG IV SOLR
INTRAVENOUS | Status: DC | PRN
  Administered 2023-10-07: 1000 mg

## 2023-10-07 MED ORDER — FENTANYL CITRATE (PF) 100 MCG/2ML IJ SOLN
25.0000 ug | INTRAMUSCULAR | Status: DC | PRN
  Administered 2023-10-07 (×2): 50 ug via INTRAVENOUS

## 2023-10-07 MED ORDER — FENTANYL CITRATE (PF) 100 MCG/2ML IJ SOLN
INTRAMUSCULAR | Status: AC
  Administered 2023-10-07: 50 ug via INTRAVENOUS
  Filled 2023-10-07: qty 2

## 2023-10-07 MED ORDER — METOCLOPRAMIDE HCL 5 MG/ML IJ SOLN
5.0000 mg | Freq: Three times a day (TID) | INTRAMUSCULAR | Status: DC | PRN
  Administered 2023-10-07: 10 mg via INTRAVENOUS
  Filled 2023-10-07: qty 2

## 2023-10-07 MED ORDER — CHLORHEXIDINE GLUCONATE 0.12 % MT SOLN
15.0000 mL | Freq: Once | OROMUCOSAL | Status: AC
  Administered 2023-10-07: 15 mL via OROMUCOSAL
  Filled 2023-10-07: qty 15

## 2023-10-07 MED ORDER — LIDOCAINE 2% (20 MG/ML) 5 ML SYRINGE
INTRAMUSCULAR | Status: DC | PRN
  Administered 2023-10-07: 20 mg via INTRAVENOUS

## 2023-10-07 MED ORDER — SODIUM CHLORIDE (PF) 0.9 % IJ SOLN
INTRAMUSCULAR | Status: AC
  Filled 2023-10-07: qty 30

## 2023-10-07 MED ORDER — PROPOFOL 500 MG/50ML IV EMUL
INTRAVENOUS | Status: DC | PRN
  Administered 2023-10-07: 100 ug/kg/min via INTRAVENOUS

## 2023-10-07 MED ORDER — LACTATED RINGERS IV SOLN: INTRAVENOUS | Status: DC

## 2023-10-07 MED ORDER — MENTHOL 3 MG MT LOZG: 1.0000 | LOZENGE | OROMUCOSAL | Status: DC | PRN

## 2023-10-07 MED ORDER — METHOCARBAMOL 1000 MG/10ML IJ SOLN: 500.0000 mg | Freq: Four times a day (QID) | INTRAMUSCULAR | Status: DC | PRN

## 2023-10-07 MED ORDER — SODIUM CHLORIDE 0.9 % IR SOLN
Status: DC | PRN
Start: 2023-10-07 — End: 2023-10-07
  Administered 2023-10-07: 3000 mL

## 2023-10-07 MED ORDER — ACETAMINOPHEN 500 MG PO TABS
1000.0000 mg | ORAL_TABLET | Freq: Once | ORAL | Status: AC
  Administered 2023-10-07: 1000 mg via ORAL
  Filled 2023-10-07: qty 2

## 2023-10-07 MED ORDER — CEFAZOLIN SODIUM-DEXTROSE 2-4 GM/100ML-% IV SOLN
2.0000 g | Freq: Three times a day (TID) | INTRAVENOUS | Status: AC
  Administered 2023-10-07 – 2023-10-08 (×2): 2 g via INTRAVENOUS
  Filled 2023-10-07 (×2): qty 100

## 2023-10-07 MED ORDER — MIDAZOLAM HCL 2 MG/2ML IJ SOLN
INTRAMUSCULAR | Status: AC
  Filled 2023-10-07: qty 2

## 2023-10-07 MED ORDER — FENTANYL CITRATE (PF) 250 MCG/5ML IJ SOLN
INTRAMUSCULAR | Status: DC | PRN
  Administered 2023-10-07: 50 ug via INTRAVENOUS
  Administered 2023-10-07: 25 ug via INTRAVENOUS

## 2023-10-07 MED ORDER — METOCLOPRAMIDE HCL 5 MG PO TABS: 5.0000 mg | ORAL_TABLET | Freq: Three times a day (TID) | ORAL | Status: DC | PRN

## 2023-10-07 MED ORDER — ORAL CARE MOUTH RINSE: 15.0000 mL | Freq: Once | OROMUCOSAL | Status: AC

## 2023-10-07 MED ORDER — PHENYLEPHRINE HCL-NACL 20-0.9 MG/250ML-% IV SOLN
INTRAVENOUS | Status: DC | PRN
  Administered 2023-10-07: 40 ug/min via INTRAVENOUS

## 2023-10-07 MED ORDER — TRANEXAMIC ACID-NACL 1000-0.7 MG/100ML-% IV SOLN
1000.0000 mg | INTRAVENOUS | Status: AC
  Administered 2023-10-07: 1000 mg via INTRAVENOUS
  Filled 2023-10-07: qty 100

## 2023-10-07 MED ORDER — CLONIDINE HCL (ANALGESIA) 100 MCG/ML EP SOLN
EPIDURAL | Status: AC
  Filled 2023-10-07: qty 10

## 2023-10-07 MED ORDER — PROPOFOL 10 MG/ML IV BOLUS
INTRAVENOUS | Status: DC | PRN
  Administered 2023-10-07 (×2): 20 mg via INTRAVENOUS

## 2023-10-07 MED ORDER — ACETAMINOPHEN 325 MG PO TABS
325.0000 mg | ORAL_TABLET | Freq: Four times a day (QID) | ORAL | Status: DC | PRN
  Filled 2023-10-07: qty 2

## 2023-10-07 MED ORDER — TRANEXAMIC ACID 1000 MG/10ML IV SOLN
2000.0000 mg | INTRAVENOUS | Status: DC
  Filled 2023-10-07: qty 20

## 2023-10-07 MED ORDER — POVIDONE-IODINE 7.5 % EX SOLN
Freq: Once | CUTANEOUS | Status: DC
  Filled 2023-10-07: qty 118

## 2023-10-07 MED ORDER — OXYCODONE HCL 5 MG PO TABS
ORAL_TABLET | ORAL | Status: AC
  Filled 2023-10-07: qty 1

## 2023-10-07 MED ORDER — ASPIRIN 81 MG PO CHEW
81.0000 mg | CHEWABLE_TABLET | Freq: Two times a day (BID) | ORAL | Status: DC
  Administered 2023-10-07 – 2023-10-08 (×2): 81 mg via ORAL
  Filled 2023-10-07 (×2): qty 1

## 2023-10-07 MED ORDER — OXYCODONE HCL 5 MG PO TABS: 5.0000 mg | ORAL_TABLET | Freq: Once | ORAL | Status: DC | PRN

## 2023-10-07 MED ORDER — BUPIVACAINE LIPOSOME 1.3 % IJ SUSP
INTRAMUSCULAR | Status: AC
  Filled 2023-10-07: qty 20

## 2023-10-07 MED ORDER — BUPIVACAINE LIPOSOME 1.3 % IJ SUSP
INTRAMUSCULAR | Status: DC | PRN
  Administered 2023-10-07: 40 mL

## 2023-10-07 MED ORDER — PHENOL 1.4 % MT LIQD: 1.0000 | OROMUCOSAL | Status: DC | PRN

## 2023-10-07 MED ORDER — ACETAMINOPHEN 500 MG PO TABS
ORAL_TABLET | ORAL | Status: AC
Start: 2023-10-07 — End: 2023-10-07
  Administered 2023-10-07: 1000 mg via ORAL
  Filled 2023-10-07: qty 2

## 2023-10-07 MED ORDER — HYDROMORPHONE HCL 1 MG/ML IJ SOLN: 0.5000 mg | INTRAMUSCULAR | Status: DC | PRN

## 2023-10-07 MED ORDER — ONDANSETRON HCL 4 MG PO TABS: 4.0000 mg | ORAL_TABLET | Freq: Four times a day (QID) | ORAL | Status: DC | PRN

## 2023-10-07 MED ORDER — OXYCODONE HCL 5 MG/5ML PO SOLN: 5.0000 mg | Freq: Once | ORAL | Status: DC | PRN

## 2023-10-07 MED ORDER — BUPIVACAINE IN DEXTROSE 0.75-8.25 % IT SOLN
INTRATHECAL | Status: DC | PRN
Start: 2023-10-07 — End: 2023-10-07
  Administered 2023-10-07: 1.6 mL via INTRATHECAL

## 2023-10-07 MED ORDER — DOCUSATE SODIUM 100 MG PO CAPS
100.0000 mg | ORAL_CAPSULE | Freq: Two times a day (BID) | ORAL | Status: DC
  Administered 2023-10-07 – 2023-10-08 (×2): 100 mg via ORAL
  Filled 2023-10-07 (×2): qty 1

## 2023-10-07 MED ORDER — ONDANSETRON HCL 4 MG/2ML IJ SOLN: 4.0000 mg | Freq: Once | INTRAMUSCULAR | Status: DC | PRN

## 2023-10-07 MED ORDER — ONDANSETRON HCL 4 MG/2ML IJ SOLN
INTRAMUSCULAR | Status: DC | PRN
  Administered 2023-10-07: 4 mg via INTRAVENOUS

## 2023-10-07 MED ORDER — FENTANYL CITRATE (PF) 100 MCG/2ML IJ SOLN
INTRAMUSCULAR | Status: AC
  Filled 2023-10-07: qty 2

## 2023-10-07 MED ORDER — BUPIVACAINE-EPINEPHRINE (PF) 0.5% -1:200000 IJ SOLN
INTRAMUSCULAR | Status: DC | PRN
Start: 2023-10-07 — End: 2023-10-07
  Administered 2023-10-07: 20 mL via PERINEURAL

## 2023-10-07 MED ORDER — VANCOMYCIN HCL 1000 MG IV SOLR
INTRAVENOUS | Status: AC
  Filled 2023-10-07: qty 20

## 2023-10-07 MED ORDER — BUPIVACAINE HCL (PF) 0.25 % IJ SOLN
INTRAMUSCULAR | Status: AC
Start: 2023-10-07 — End: ?
  Filled 2023-10-07: qty 30

## 2023-10-07 MED ORDER — FENTANYL CITRATE (PF) 250 MCG/5ML IJ SOLN
INTRAMUSCULAR | Status: AC
  Filled 2023-10-07: qty 5

## 2023-10-07 MED ORDER — TRANEXAMIC ACID 1000 MG/10ML IV SOLN
INTRAVENOUS | Status: DC | PRN
  Administered 2023-10-07: 2000 mg via TOPICAL

## 2023-10-07 MED ORDER — MORPHINE SULFATE (PF) 4 MG/ML IV SOLN
INTRAVENOUS | Status: AC
  Filled 2023-10-07: qty 2

## 2023-10-07 MED ORDER — 0.9 % SODIUM CHLORIDE (POUR BTL) OPTIME
TOPICAL | Status: DC | PRN
  Administered 2023-10-07: 1000 mL

## 2023-10-07 MED ORDER — FENTANYL CITRATE (PF) 100 MCG/2ML IJ SOLN: 50.0000 ug | Freq: Once | INTRAMUSCULAR | Status: AC

## 2023-10-07 MED ORDER — CELECOXIB 100 MG PO CAPS
100.0000 mg | ORAL_CAPSULE | Freq: Two times a day (BID) | ORAL | Status: DC
  Administered 2023-10-07 – 2023-10-08 (×2): 100 mg via ORAL
  Filled 2023-10-07 (×2): qty 1

## 2023-10-07 MED ORDER — ACETAMINOPHEN 500 MG PO TABS
1000.0000 mg | ORAL_TABLET | Freq: Four times a day (QID) | ORAL | Status: AC
  Administered 2023-10-07 – 2023-10-08 (×3): 1000 mg via ORAL
  Filled 2023-10-07 (×3): qty 2

## 2023-10-07 MED ORDER — OXYCODONE HCL 5 MG PO TABS
5.0000 mg | ORAL_TABLET | ORAL | Status: DC | PRN
  Administered 2023-10-07 – 2023-10-08 (×4): 10 mg via ORAL
  Filled 2023-10-07 (×4): qty 2

## 2023-10-07 MED ORDER — POVIDONE-IODINE 10 % EX SWAB
2.0000 | Freq: Once | CUTANEOUS | Status: AC
  Administered 2023-10-07: 2 via TOPICAL

## 2023-10-07 MED ORDER — HYDROMORPHONE HCL 1 MG/ML IJ SOLN
INTRAMUSCULAR | Status: AC
  Administered 2023-10-07: 0.5 mg via INTRAVENOUS
  Filled 2023-10-07: qty 1

## 2023-10-07 MED ORDER — CEFAZOLIN SODIUM-DEXTROSE 2-4 GM/100ML-% IV SOLN
2.0000 g | INTRAVENOUS | Status: AC
  Administered 2023-10-07: 2 g via INTRAVENOUS
  Filled 2023-10-07: qty 100

## 2023-10-07 MED ORDER — METHOCARBAMOL 500 MG PO TABS
500.0000 mg | ORAL_TABLET | Freq: Four times a day (QID) | ORAL | Status: DC | PRN
  Administered 2023-10-07 – 2023-10-08 (×4): 500 mg via ORAL
  Filled 2023-10-07 (×3): qty 1

## 2023-10-07 MED ORDER — MORPHINE SULFATE 4 MG/ML IJ SOLN
INTRAMUSCULAR | Status: DC | PRN
  Administered 2023-10-07: 9 mL

## 2023-10-07 MED ORDER — ONDANSETRON HCL 4 MG/2ML IJ SOLN
4.0000 mg | Freq: Four times a day (QID) | INTRAMUSCULAR | Status: DC | PRN
  Administered 2023-10-07: 4 mg via INTRAVENOUS
  Filled 2023-10-07: qty 2

## 2023-10-07 MED ORDER — METHOCARBAMOL 500 MG PO TABS
ORAL_TABLET | ORAL | Status: AC
Start: 2023-10-07 — End: 2023-10-08
  Filled 2023-10-07: qty 1

## 2023-10-07 MED ORDER — SODIUM CHLORIDE (PF) 0.9 % IJ SOLN
INTRAMUSCULAR | Status: DC | PRN
  Administered 2023-10-07: 20 mL

## 2023-10-07 MED ORDER — PANTOPRAZOLE SODIUM 40 MG PO TBEC
40.0000 mg | DELAYED_RELEASE_TABLET | Freq: Two times a day (BID) | ORAL | Status: DC
  Administered 2023-10-07 – 2023-10-08 (×2): 40 mg via ORAL
  Filled 2023-10-07 (×2): qty 1

## 2023-10-07 SURGICAL SUPPLY — 63 items
ALCOHOL 70% 16 OZ (MISCELLANEOUS) ×1 IMPLANT
BAG COUNTER SPONGE SURGICOUNT (BAG) ×1 IMPLANT
BANDAGE ESMARK 6X9 LF (GAUZE/BANDAGES/DRESSINGS) ×1 IMPLANT
BLADE SAW SGTL HD 18.5X60.5X1. (BLADE) IMPLANT
BNDG COHESIVE 6X5 TAN ST LF (GAUZE/BANDAGES/DRESSINGS) ×1 IMPLANT
BNDG ELASTIC 4X5.8 VLCR STR LF (GAUZE/BANDAGES/DRESSINGS) ×1 IMPLANT
BNDG ELASTIC 6X15 VLCR STRL LF (GAUZE/BANDAGES/DRESSINGS) ×3 IMPLANT
BOWL SMART MIX CTS (DISPOSABLE) ×1 IMPLANT
CEMENT BONE SIMPLEX SPEEDSET (Cement) IMPLANT
COMP PATELLAR LG CMNT 7.5X18.5 (Cement) IMPLANT
COOLER ICEMAN CLASSIC (MISCELLANEOUS) IMPLANT
COVER SURGICAL LIGHT HANDLE (MISCELLANEOUS) ×1 IMPLANT
CUFF TRNQT CYL 34X4.125X (TOURNIQUET CUFF) ×1 IMPLANT
DRAPE IMP U-DRAPE 54X76 (DRAPES) ×1 IMPLANT
DRAPE SURG ORHT 6 SPLT 77X108 (DRAPES) ×3 IMPLANT
DRAPE U-SHAPE 47X51 STRL (DRAPES) ×1 IMPLANT
DRSG AQUACEL AG ADV 3.5X10 (GAUZE/BANDAGES/DRESSINGS) IMPLANT
DURAPREP 26ML APPLICATOR (WOUND CARE) ×1 IMPLANT
ELECTRODE REM PT RTRN 9FT ADLT (ELECTROSURGICAL) ×1 IMPLANT
FACESHIELD WRAPAROUND (MASK) IMPLANT
FACESHIELD WRAPAROUND OR TEAM (MASK) ×1 IMPLANT
FEMORAL KNEE TROCH XL 8.5X5.0 (Orthopedic Implant) IMPLANT
GAUZE PAD ABD 8X10 STRL (GAUZE/BANDAGES/DRESSINGS) ×2 IMPLANT
GAUZE SPONGE 4X4 12PLY STRL (GAUZE/BANDAGES/DRESSINGS) ×1 IMPLANT
GAUZE XEROFORM 5X9 LF (GAUZE/BANDAGES/DRESSINGS) ×1 IMPLANT
GLOVE BIOGEL PI IND STRL 8 (GLOVE) ×1 IMPLANT
GLOVE ECLIPSE 8.0 STRL XLNG CF (GLOVE) ×1 IMPLANT
GOWN STRL REUS W/ TWL LRG LVL3 (GOWN DISPOSABLE) ×3 IMPLANT
GOWN STRL REUS W/ TWL XL LVL3 (GOWN DISPOSABLE) ×1 IMPLANT
HOOD W/PEELAWAY (MISCELLANEOUS) ×3 IMPLANT
KIT BASIN OR (CUSTOM PROCEDURE TRAY) ×1 IMPLANT
KIT PIN (KITS) IMPLANT
KIT TURNOVER KIT B (KITS) ×1 IMPLANT
KNEE PATELLA ASYMMETRIC 9X29 (Knees) IMPLANT
MANIFOLD NEPTUNE II (INSTRUMENTS) ×1 IMPLANT
NDL 18GX1X1/2 (RX/OR ONLY) (NEEDLE) ×1 IMPLANT
NDL HYPO 22X1.5 SAFETY MO (MISCELLANEOUS) ×2 IMPLANT
NDL SPNL 18GX3.5 QUINCKE PK (NEEDLE) ×1 IMPLANT
NEEDLE 18GX1X1/2 (RX/OR ONLY) (NEEDLE) ×1 IMPLANT
NEEDLE HYPO 22X1.5 SAFETY MO (MISCELLANEOUS) ×2 IMPLANT
NEEDLE SPNL 18GX3.5 QUINCKE PK (NEEDLE) ×1 IMPLANT
NS IRRIG 1000ML POUR BTL (IV SOLUTION) ×2 IMPLANT
PACK TOTAL JOINT (CUSTOM PROCEDURE TRAY) ×1 IMPLANT
PACK UNIVERSAL I (CUSTOM PROCEDURE TRAY) ×1 IMPLANT
PAD ARMBOARD POSITIONER FOAM (MISCELLANEOUS) ×2 IMPLANT
PAD CAST 4YDX4 CTTN HI CHSV (CAST SUPPLIES) ×1 IMPLANT
PAD COLD SHLDR WRAP-ON (PAD) IMPLANT
PADDING CAST COTTON 6X4 STRL (CAST SUPPLIES) ×3 IMPLANT
SET HNDPC FAN SPRY TIP SCT (DISPOSABLE) ×1 IMPLANT
SOL PREP POV-IOD 4OZ 10% (MISCELLANEOUS) ×1 IMPLANT
SPONGE T-LAP 4X18 ~~LOC~~+RFID (SPONGE) IMPLANT
STRIP CLOSURE SKIN 1/2X4 (GAUZE/BANDAGES/DRESSINGS) IMPLANT
SUCTION TUBE FRAZIER 10FR DISP (SUCTIONS) ×1 IMPLANT
SUT ETHILON 3 0 PS 1 (SUTURE) IMPLANT
SUT VIC AB 0 CT1 27XBRD ANBCTR (SUTURE) ×3 IMPLANT
SUT VIC AB 1 CT1 27XBRD ANBCTR (SUTURE) ×5 IMPLANT
SUT VIC AB 2-0 CT2 27 (SUTURE) ×2 IMPLANT
SYR 30ML LL (SYRINGE) ×1 IMPLANT
SYR TB 1ML LUER SLIP (SYRINGE) ×1 IMPLANT
TOWEL GREEN STERILE (TOWEL DISPOSABLE) ×2 IMPLANT
TOWEL GREEN STERILE FF (TOWEL DISPOSABLE) ×2 IMPLANT
TRAY FOLEY MTR SLVR 16FR STAT (SET/KITS/TRAYS/PACK) IMPLANT
WATER STERILE IRR 1000ML POUR (IV SOLUTION) ×2 IMPLANT

## 2023-10-07 NOTE — Anesthesia Procedure Notes (Signed)
 Anesthesia Regional Block: Adductor canal block   Pre-Anesthetic Checklist: , timeout performed,  Correct Patient, Correct Site, Correct Laterality,  Correct Procedure, Correct Position, site marked,  Risks and benefits discussed,  Surgical consent,  Pre-op evaluation,  At surgeon's request and post-op pain management  Laterality: Right  Prep: chloraprep       Needles:  Injection technique: Single-shot  Needle Type: Echogenic Needle     Needle Length: 10cm  Needle Gauge: 21     Additional Needles:   Narrative:  Start time: 10/07/2023 11:06 AM End time: 10/07/2023 11:08 AM Injection made incrementally with aspirations every 5 mL.  Performed by: Personally  Anesthesiologist: Juventino Oppenheim, MD  Additional Notes: No pain on injection. No increased resistance to injection. Injection made in 5cc increments. Good needle visualization. Patient tolerated the procedure well.

## 2023-10-07 NOTE — Transfer of Care (Signed)
 Immediate Anesthesia Transfer of Care Note  Patient: Beverly [name]  Procedure(s) Performed: RIGHT KNEE PATELLOFEMORAL ARTHROPLASTY (Right: Knee)  Patient Location: PACU  Anesthesia Type:MAC and MAC combined with regional for post-op pain  Level of Consciousness: awake, alert , oriented, and patient cooperative  Airway & Oxygen Therapy: Patient Spontanous Breathing and Patient connected to nasal cannula oxygen  Post-op Assessment: Report given to RN and Post -op Vital signs reviewed and stable  Post vital signs: Reviewed and stable  Last Vitals:  Vitals Value Taken Time  BP 143/90 10/07/23 1504  Temp    Pulse 68 10/07/23 1506  Resp 14 10/07/23 1506  SpO2 100 % 10/07/23 1506  Vitals shown include unfiled device data.  Last Pain:  Vitals:   10/07/23 1111  TempSrc:   PainSc: 0-No pain         Complications: No notable events documented.

## 2023-10-07 NOTE — Progress Notes (Signed)
 Orthopedic Tech Progress Note Patient Details:  Beverly [name] 1945/08/08 [id number]  Patient ID: [name], female   DOB: 30-Jun-1945, 78 y.o.   MRN: [id number] Patient declined CPM and bone foam usage till tomorrow morning due to nausea and pain. Bone foam and CPM left at bedside. Toi Foster 10/07/2023, 9:43 PM

## 2023-10-07 NOTE — Anesthesia Postprocedure Evaluation (Signed)
 Anesthesia Post Note  Patient: Beverly [name]  Procedure(s) Performed: RIGHT KNEE PATELLOFEMORAL ARTHROPLASTY (Right: Knee)     Patient location during evaluation: PACU Anesthesia Type: Spinal Level of consciousness: awake and alert Pain management: pain level controlled Vital Signs Assessment: post-procedure vital signs reviewed and stable Respiratory status: spontaneous breathing, nonlabored ventilation and respiratory function stable Cardiovascular status: stable and blood pressure returned to baseline Postop Assessment: spinal receding and no apparent nausea or vomiting Anesthetic complications: no  No notable events documented.  Last Vitals:  Vitals:   10/07/23 1530 10/07/23 1545  BP: (!) 161/69 (!) 145/81  Pulse: (!) 58 61  Resp: 14 10  Temp:    SpO2: 100% 98%                  Juventino Oppenheim

## 2023-10-07 NOTE — Anesthesia Procedure Notes (Signed)
 Spinal  Patient location during procedure: OR Start time: 10/07/2023 12:15 PM End time: 10/07/2023 12:18 PM Reason for block: surgical anesthesia Staffing Performed: anesthesiologist  Anesthesiologist: Juventino Oppenheim, MD Performed by: Juventino Oppenheim, MD Authorized by: Juventino Oppenheim, MD   Preanesthetic Checklist Completed: patient identified, IV checked, risks and benefits discussed, surgical consent, monitors and equipment checked, pre-op evaluation and timeout performed Spinal Block Patient position: sitting Prep: DuraPrep Patient monitoring: heart rate, cardiac monitor, continuous pulse ox and blood pressure Approach: midline Location: L3-4 Injection technique: single-shot Needle Needle type: Pencan  Needle gauge: 24 G Additional Notes Consent was obtained prior to the procedure with all questions answered and concerns addressed. Risks including, but not limited to, bleeding, infection, nerve damage, paralysis, failed block, inadequate analgesia, allergic reaction, high spinal, itching, and headache were discussed and the patient wished to proceed. Functioning IV was confirmed and monitors were applied. Sterile prep and drape, including hand hygiene, mask, and sterile gloves were used. The patient was positioned and the spine was prepped. The skin was anesthetized with lidocaine . Free flow of clear CSF was obtained prior to injecting local anesthetic into the CSF. The spinal needle aspirated freely following injection. The needle was carefully withdrawn. The patient tolerated the procedure well.   Hobart Lulas, MD

## 2023-10-07 NOTE — Brief Op Note (Signed)
   10/07/2023  3:13 PM  PATIENT:  Beverly [name]  78 y.o. female  PRE-OPERATIVE DIAGNOSIS:  right patellofemoral arthritis  POST-OPERATIVE DIAGNOSIS:  right patellofemoral arthritis  PROCEDURE:  Procedure(s): RIGHT KNEE PATELLOFEMORAL ARTHROPLASTY  SURGEON:  Surgeon(s): Jasmine Mesi, MD  ASSISTANT: Magnant PA  ANESTHESIA:   Spinal  EBL: 75 ml    Total I/O In: 900 [I.V.:800; IV Piggyback:100] Out: 575 [Urine:500; Blood:75]  BLOOD ADMINISTERED: none  DRAINS: none   LOCAL MEDICATIONS USED:  none  SPECIMEN:  No Specimen  COUNTS:  YES  TOURNIQUET: 63 minutes at 300 mmHg  DICTATION: .Other Dictation: Dictation Number [id number]  PLAN OF CARE: Admit for overnight observation  PATIENT DISPOSITION:  PACU - hemodynamically stable

## 2023-10-07 NOTE — Anesthesia Preprocedure Evaluation (Addendum)
 Anesthesia Evaluation  Patient identified by MRN, date of birth, ID band Patient awake    Reviewed: Allergy & Precautions, NPO status , Patient's Chart, lab work & pertinent test results  History of Anesthesia Complications (+) PONV and history of anesthetic complications  Airway Mallampati: II  TM Distance: >3 FB Neck ROM: Full    Dental  (+) Dental Advisory Given   Pulmonary COPD, former smoker  Bronchiectasis    Pulmonary exam normal        Cardiovascular Normal cardiovascular exam     Neuro/Psych negative neurological ROS  negative psych ROS   GI/Hepatic Neg liver ROS, hiatal hernia,GERD  Medicated and Controlled,,  Endo/Other  negative endocrine ROS    Renal/GU negative Renal ROS     Musculoskeletal  (+) Arthritis , Osteoarthritis,    Abdominal   Peds  Hematology negative hematology ROS (+)   Anesthesia Other Findings   Reproductive/Obstetrics                             Anesthesia Physical Anesthesia Plan  ASA: 2  Anesthesia Plan: Spinal   Post-op Pain Management: Tylenol  PO (pre-op)* and Regional block*   Induction: Intravenous  PONV Risk Score and Plan: 4 or greater and Treatment may vary due to age or medical condition and Propofol  infusion  Airway Management Planned: Natural Airway and Simple Face Mask  Additional Equipment: None  Intra-op Plan:   Post-operative Plan:   Informed Consent: I have reviewed the patients History and Physical, chart, labs and discussed the procedure including the risks, benefits and alternatives for the proposed anesthesia with the patient or authorized representative who has indicated his/her understanding and acceptance.       Plan Discussed with: CRNA, Anesthesiologist and Surgeon  Anesthesia Plan Comments: (Labs reviewed, platelets acceptable. Discussed risks and benefits of spinal, including spinal/epidural hematoma,  infection, failed block, and PDPH. Patient expressed understanding and wished to proceed. )        Anesthesia Quick Evaluation

## 2023-10-07 NOTE — H&P (Signed)
 Beverly [name] is an 78 y.o. female.   Chief Complaint: right knee pain HPI:  Beverly [name] is a 78 y.o. female who presents reporting bilateral knee pain right equal to left.  She has a long history of patellofemoral arthritis.  No history of frank patellar dislocation and no prior surgical history in the right knee.  Starting around age 4 with slow but steady progression.  Any type of walking on uneven ground is hindered due to pain.  Ibuprofen is required on his as-needed basis fairly infrequently.  She does like to do a lot of hiking.  Flat ground walking no problem.  She has had many shots in both knees in the past including steroid and gel injections.  Does not wake her from sleep at night..  Patient recently had a viral illness from which she has fully recovered.  No personal or family history of DVT or pulmonary embolism  Past Medical History:  Diagnosis Date   Atypical mole 11/03/2013   R prox post lat thigh    Basal cell carcinoma    mid back   Basal cell carcinoma 04/01/2019   L chest parasternal    Basal cell carcinoma 09/11/2017   L mid back at braline 3.0 cm lat to spine   Emphysema lung (HCC)    Mild no symptoms Found on coronary CT scan   Esophageal dysmotility    GERD (gastroesophageal reflux disease)    occassional   History of hiatal hernia    OA (osteoarthritis)    hips and knees   Pneumonia    history of    PONV (postoperative nausea and vomiting)    Squamous cell carcinoma of skin 06/11/2021   L mid [facility]ibia, [facility]    Past Surgical History:  Procedure Laterality Date   COLONOSCOPY     FRACTURE SURGERY  2009   left ankle   INGUINAL HERNIA REPAIR Right 08/22/2021   Procedure: OPEN RIGHT INGUINAL HERNIA REPAIR WITH MESH;  Surgeon: Adalberto Acton, MD;  Location: [facility];  Service: General;  Laterality: Right;   LAPAROSCOPY  1992   infection after tubal   SHOULDER OPEN ROTATOR CUFF REPAIR Right 2009   TOTAL HIP ARTHROPLASTY Left 01/29/2019   Procedure: LEFT TOTAL  HIP ARTHROPLASTY ANTERIOR APPROACH;  Surgeon: Arnie Lao, MD;  Location: [facility];  Service: Orthopedics;  Laterality: Left;   TOTAL HIP ARTHROPLASTY Right 09/24/2019   Procedure: RIGHT TOTAL HIP ARTHROPLASTY ANTERIOR APPROACH;  Surgeon: Arnie Lao, MD;  Location: [facility];  Service: Orthopedics;  Laterality: Right;   TUBAL LIGATION  1992    Family History  Problem Relation Age of Onset   Allergic rhinitis Neg Hx    Angioedema Neg Hx    Asthma Neg Hx    Atopy Neg Hx    Eczema Neg Hx    Immunodeficiency Neg Hx    Urticaria Neg Hx    Social History:  reports that she quit smoking about 43 years ago. Her smoking use included cigarettes. She has never used smokeless tobacco. She reports current alcohol use of about 1.0 standard drink of alcohol per week. She reports that she does not use drugs.  Allergies:  Allergies  Allergen Reactions   Macrodantin Hives and Rash    Other Reaction(s): hives, Other (See Comments), Unknown   Rosuvastatin Other (See Comments)    Other Reaction(s): myalgia/arthralgia, Myalgias   Wound Dressings Itching   Sulfa Antibiotics Palpitations, Swelling and Anxiety    Other Reaction(s): heart  races, itchy    Medications Prior to Admission  Medication Sig Dispense Refill   bimatoprost  (LATISSE ) 0.03 % ophthalmic solution PLACE ONE DROP ON APPLICATOR AND APPLY EVENLY ALONG THE SKIN OF THE UPPER EYELID AT BASE OF EYELASHES ONCE DAILY AT BEDTIME TO BOTH EYES 3 mL 4   Calcium Carb-Cholecalciferol (CALCIUM 600 + D PO) Take 1 tablet by mouth daily.     estradiol (VIVELLE-DOT) 0.025 MG/24HR Place 1 patch onto the skin 2 (two) times a week. Monday  and Friday     Multiple Vitamins-Minerals (MULTIVITAMIN WITH MINERALS) tablet Take 1 tablet by mouth daily.     Oil of Oregano 1500 MG CAPS Take 1,500 mg by mouth daily.     OVER THE COUNTER MEDICATION Take 7-8 capsules by mouth at bedtime. [place] RHUBARB 400 mg/capsule     pantoprazole  (PROTONIX ) 40 MG  tablet TAKE 1 TABLET BY MOUTH TWICE A DAY 180 tablet 1   pravastatin (PRAVACHOL) 10 MG tablet Take 10 mg by mouth See admin instructions. Twice weekly - Wednesday & Friday     Probiotic Product (PROBIOTIC PO) Take 1 capsule by mouth daily. 5 Billion CFUs     progesterone (PROMETRIUM) 100 MG capsule Take 100 mg by mouth every Monday, Wednesday, and Friday.      vitamin B-12 (CYANOCOBALAMIN ) 1000 MCG tablet Take 1,000 mcg by mouth daily.     mometasone  (ELOCON ) 0.1 % cream APPLY TOPICALLY TO RASH ON NECK DAILY AS NEEDED (Patient taking differently: Apply 1 Application topically daily as needed (rash on ear).) 15 g 0   psyllium (METAMUCIL) 58.6 % powder Take 1 packet by mouth 2 (two) times daily.      No results found for this or any previous visit (from the past 48 hours). No results found.  Review of Systems  Musculoskeletal:  Positive for arthralgias.  All other systems reviewed and are negative.   Blood pressure (!) 153/81, pulse 81, temperature 98.9 F (37.2 C), temperature source Oral, resp. rate 18, height 5\' 7"  (1.702 m), weight 62.1 kg, SpO2 98%. Physical Exam Vitals reviewed.  HENT:     Head: Normocephalic.     Nose: Nose normal.     Mouth/Throat:     Mouth: Mucous membranes are moist.  Eyes:     Pupils: Pupils are equal, round, and reactive to light.  Cardiovascular:     Rate and Rhythm: Normal rate.     Pulses: Normal pulses.  Pulmonary:     Effort: Pulmonary effort is normal.  Abdominal:     General: Abdomen is flat.  Musculoskeletal:     Cervical back: Normal range of motion.  Skin:    Capillary Refill: Capillary refill takes less than 2 seconds.  Neurological:     General: No focal deficit present.     Mental Status: She is alert.  Psychiatric:        Mood and Affect: Mood normal.    Ortho exam demonstrates right knee normal alignment. No increased Q angle. No effusion in either knee. Collateral and cruciate ligaments are stable. Patellofemoral crepitus is  present. Negative patellar apprehension. No joint line tenderness in either knee. Pedal pulses palpable. Quad tone excellent for a patient who is 60.   ankle dorsiflexion intact.  Skin intact in the right knee region.  Assessment/Plan Impression is bilateral patellofemoral arthritis. MRI scan of both knees are reviewed and it really shows isolated patellofemoral arthritis with no increase in tibial tubercle trochlear groove distance. Joint spaces and menisci  intact in the medial and lateral compartments in both knees. We talked about patellofemoral replacement and the inherent risk and benefits involved with that surgery along with the rehabilitation time.  Since she was last seen about 9 months ago in the clinic her symptoms have worsened.  The right knee is slightly worse than the left knee.  Her husband is at home and is able to take care of her.  All questions answered.   Marykay Snipes, MD 10/07/2023, 11:00 AM

## 2023-10-07 NOTE — Plan of Care (Signed)

## 2023-10-08 DIAGNOSIS — Z96643 Presence of artificial hip joint, bilateral: Secondary | ICD-10-CM | POA: Diagnosis not present

## 2023-10-08 DIAGNOSIS — M2241 Chondromalacia patellae, right knee: Secondary | ICD-10-CM | POA: Diagnosis not present

## 2023-10-08 DIAGNOSIS — M1711 Unilateral primary osteoarthritis, right knee: Secondary | ICD-10-CM | POA: Diagnosis not present

## 2023-10-08 DIAGNOSIS — M222X1 Patellofemoral disorders, right knee: Secondary | ICD-10-CM | POA: Diagnosis not present

## 2023-10-08 DIAGNOSIS — Z85828 Personal history of other malignant neoplasm of skin: Secondary | ICD-10-CM | POA: Diagnosis not present

## 2023-10-08 DIAGNOSIS — Z79899 Other long term (current) drug therapy: Secondary | ICD-10-CM | POA: Diagnosis not present

## 2023-10-08 MED ORDER — DOCUSATE SODIUM 100 MG PO CAPS: 100.0000 mg | ORAL_CAPSULE | Freq: Two times a day (BID) | ORAL | 0 refills | Status: DC

## 2023-10-08 MED ORDER — ACETAMINOPHEN 325 MG PO TABS
325.0000 mg | ORAL_TABLET | Freq: Four times a day (QID) | ORAL | 0 refills | Status: DC | PRN
Start: 2023-10-08 — End: 2024-03-17

## 2023-10-08 MED ORDER — CELECOXIB 100 MG PO CAPS: 100.0000 mg | ORAL_CAPSULE | Freq: Two times a day (BID) | ORAL | 0 refills | Status: DC

## 2023-10-08 MED ORDER — ASPIRIN 81 MG PO CHEW: 81.0000 mg | CHEWABLE_TABLET | Freq: Two times a day (BID) | ORAL | 0 refills | Status: DC

## 2023-10-08 MED ORDER — METHOCARBAMOL 500 MG PO TABS: 500.0000 mg | ORAL_TABLET | Freq: Three times a day (TID) | ORAL | 0 refills | Status: DC | PRN

## 2023-10-08 MED ORDER — OXYCODONE HCL 5 MG PO TABS: 5.0000 mg | ORAL_TABLET | ORAL | 0 refills | Status: DC | PRN

## 2023-10-08 NOTE — Evaluation (Signed)
 Physical Therapy Evaluation Patient Details Name: Beverly [name] MRN: [id number] DOB: 1945-09-06 Today's Date: 10/08/2023  History of Present Illness  Patient is a 78 y/o female admitted 10/07/23 for R patellofemoral arthroplasty.  PMH positive for GERD, OA, PNA, basal cell CA, previous THA bilat and R RCR.  Clinical Impression  Patient presents with decreased mobility due to pain and limited ROM/strength R LE.  Previously independent at home she needed CGA to min A for mobility into hallway this session with RW.  She will benefit from continued skilled PT prior to d/c home with spouse assist and follow up as appropriate per MD>         If plan is discharge home, recommend the following: A little help with walking and/or transfers;A little help with bathing/dressing/bathroom;Assistance with cooking/housework;Assist for transportation;Help with stairs or ramp for entrance   Can travel by private vehicle        Equipment Recommendations None recommended by PT  Recommendations for Other Services       Functional Status Assessment Patient has had a recent decline in their functional status and demonstrates the ability to make significant improvements in function in a reasonable and predictable amount of time.     Precautions / Restrictions Precautions Precautions: Fall;Knee Recall of Precautions/Restrictions: Intact Required Braces or Orthoses: Knee Immobilizer - Right Restrictions Weight Bearing Restrictions Per Provider Order: No Other Position/Activity Restrictions: WBAT      Mobility  Bed Mobility Overal bed mobility: Needs Assistance Bed Mobility: Supine to Sit     Supine to sit: Contact guard     General bed mobility comments: some support for R LE with knee immobilizer    Transfers Overall transfer level: Needs assistance Equipment used: Rolling walker (2 wheels) Transfers: Sit to/from Stand Sit to Stand: Contact guard assist           General transfer comment:  cues for hand placement and R LE management    Ambulation/Gait Ambulation/Gait assistance: Contact guard assist Gait Distance (Feet): 100 Feet (x 2) Assistive device: Rolling walker (2 wheels) Gait Pattern/deviations: Step-to pattern, Decreased stride length, Antalgic       General Gait Details: seated rest due to "feeling hot" and fine to continue walking.  Cues for sequence  Stairs            Wheelchair Mobility     Tilt Bed    Modified Rankin (Stroke Patients Only)       Balance Overall balance assessment: Needs assistance   Sitting balance-Leahy Scale: Good     Standing balance support: Bilateral upper extremity supported, Reliant on assistive device for balance Standing balance-Leahy Scale: Poor Standing balance comment: still a little woozy from meds                             Pertinent Vitals/Pain Pain Assessment Pain Assessment: Faces Faces Pain Scale: Hurts even more Pain Location: R knee with ROM Pain Descriptors / Indicators: Aching, Sore Pain Intervention(s): Monitored during session, Repositioned, Ice applied, Premedicated before session    Home Living Family/patient expects to be discharged to:: Private residence Living Arrangements: Spouse/significant other Available Help at Discharge: Family Type of Home: House Home Access: Stairs to enter Entrance Stairs-Rails: None Entrance Stairs-Number of Steps: 2   Home Layout: One level Home Equipment: Teacher, English as a foreign language (2 wheels)      Prior Function Prior Level of Function : Independent/Modified Independent  Mobility Comments: likes hiking       Extremity/Trunk Assessment   Upper Extremity Assessment Upper Extremity Assessment: Overall WFL for tasks assessed    Lower Extremity Assessment Lower Extremity Assessment: RLE deficits/detail RLE Deficits / Details: knee flexion ROM approx 75 in sitting and close to 0 in supine.    Cervical / Trunk  Assessment Cervical / Trunk Assessment: Normal  Communication   Communication Communication: No apparent difficulties    Cognition Arousal: Alert Behavior During Therapy: WFL for tasks assessed/performed   PT - Cognitive impairments: No apparent impairments                         Following commands: Intact       Cueing Cueing Techniques: Verbal cues     General Comments General comments (skin integrity, edema, etc.): ice applied end of session; spouse in the room    Exercises Total Joint Exercises Ankle Circles/Pumps: AROM, 5 reps, Supine, Both Quad Sets: AROM, Right, 5 reps, Supine Short Arc Quad: AROM, Right, 5 reps, Seated Heel Slides: AROM, Right, 5 reps, Seated   Assessment/Plan    PT Assessment Patient needs continued PT services  PT Problem List Decreased mobility;Decreased activity tolerance;Decreased balance;Decreased knowledge of use of DME;Decreased range of motion       PT Treatment Interventions DME instruction;Therapeutic exercise;Gait training;Balance training;Stair training;Functional mobility training;Therapeutic activities;Patient/family education    PT Goals (Current goals can be found in the Care Plan section)  Acute Rehab PT Goals Patient Stated Goal: return home PT Goal Formulation: With patient/family Time For Goal Achievement: 10/13/23 Potential to Achieve Goals: Good    Frequency 7X/week     Co-evaluation               AM-PAC PT "6 Clicks" Mobility  Outcome Measure Help needed turning from your back to your side while in a flat bed without using bedrails?: None Help needed moving from lying on your back to sitting on the side of a flat bed without using bedrails?: A Little Help needed moving to and from a bed to a chair (including a wheelchair)?: A Little Help needed standing up from a chair using your arms (e.g., wheelchair or bedside chair)?: A Little Help needed to walk in hospital room?: A Little Help needed  climbing 3-5 steps with a railing? : Total 6 Click Score: 17    End of Session Equipment Utilized During Treatment: Gait belt Activity Tolerance: Patient limited by fatigue Patient left: in chair;with call bell/phone within reach;with family/visitor present   PT Visit Diagnosis: Difficulty in walking, not elsewhere classified (R26.2);Pain Pain - Right/Left: Right Pain - part of body: Knee    Time: 1610-9604 PT Time Calculation (min) (ACUTE ONLY): 28 min   Charges:   PT Evaluation $PT Eval Moderate Complexity: 1 Mod PT Treatments $Gait Training: 8-22 mins PT General Charges $$ ACUTE PT VISIT: 1 Visit         Abigail Hoff, PT Acute Rehabilitation Services Office:[phone] 10/08/2023   Marley Simmers 10/08/2023, 12:18 PM

## 2023-10-08 NOTE — Progress Notes (Signed)
  Subjective: Beverly [name] is a 78 y.o. female s/p right knee patellofemoral arthroplasty.  They are POD 1.  Pt's pain is controlled.  Pt denies any complain of chest pain, shortness of breath, abdominal pain, calf pain.  Patient denies any fevers or chills.  Has not ambulated yet as of this morning but she was able to walk with physical therapy later today after I evaluated her.  She did well with physical therapy and feels ready for discharge.  Objective: Vital signs in last 24 hours: Temp:  [98 F (36.7 C)-98.6 F (37 C)] 98.4 F (36.9 C) (05/07 1503) Pulse Rate:  [56-74] 74 (05/07 1503) Resp:  [9-17] 17 (05/07 1503) BP: (124-181)/(66-89) 181/87 (05/07 1503) SpO2:  [92 %-99 %] 99 % (05/07 1503)  Intake/Output from previous day: 05/06 0701 - 05/07 0700 In: 1000 [I.V.:800; IV Piggyback:200] Out: 575 [Urine:500; Blood:75] Intake/Output this shift: Total I/O In: 480 [P.O.:480] Out: -   Exam:  No gross blood or drainage overlying the dressing 2+ DP pulse Sensation intact distally in the operative foot Able to dorsiflex and plantarflex the operative foot No calf tenderness.  Negative Homans' sign. Able to perform straight leg raise   Labs: No results for input(s): "HGB" in the last 72 hours. No results for input(s): "WBC", "RBC", "HCT", "PLT" in the last 72 hours. No results for input(s): "NA", "K", "CL", "CO2", "BUN", "CREATININE", "GLUCOSE", "CALCIUM" in the last 72 hours. No results for input(s): "LABPT", "INR" in the last 72 hours.  Assessment/Plan: Pt is POD 1 s/p right patellofemoral arthroplasty.    -Plan to discharge to home today pending patient's pain and PT eval  -WBAT with a walker  -Follow-up with Dr. Rozelle Corning in clinic 2 weeks postoperatively    [facility] 10/08/2023, 3:41 PM

## 2023-10-08 NOTE — TOC Transition Note (Addendum)
 Transition of Care [facility]) - Discharge Note   Patient Details  Name: Beverly [name] MRN: [id number] Date of Birth: 13-Dec-1945  Transition of [facility] CM/SW Contact:  Omie Bickers, RN Phone Number: 10/08/2023, 10:35 AM   Clinical Narrative:     Spoke w patient at bedside.  She is agreeable to home w [facility] services. She has used [facility] in the past and would like to use them again. Referral accepted by liaison.  She states that she has RW, shower seat toilet seat riser and CPM at home.  Spouse will transport home     Barriers to Discharge: No Barriers Identified   Patient Goals and CMS Choice Patient states their goals for this hospitalization and ongoing recovery are:: to go home CMS Medicare.gov Compare Post Acute Care list provided to:: Patient Choice offered to / list presented to : Patient      Discharge Placement                       Discharge Plan and Services Additional resources added to the After Visit Summary for     Discharge Planning Services: CM Consult                      HH Arranged: PT, OT [facility] Agency: [facility] Date [facility] Agency Contacted: 10/08/23 Time HH Agency Contacted: 1035 Representative spoke with at [facility] Agency: Loetta Ringer  Social Drivers of Health (SDOH) Interventions SDOH Screenings   Food Insecurity: No Food Insecurity (10/07/2023)  Housing: Low Risk  (10/07/2023)  Transportation Needs: No Transportation Needs (10/07/2023)  Utilities: Not At Risk (10/07/2023)  Social Connections: Moderately Integrated (10/07/2023)  Tobacco Use: Medium Risk (10/07/2023)     Readmission Risk Interventions     No data to display

## 2023-10-08 NOTE — Progress Notes (Signed)
 Physical Therapy Treatment Patient Details Name: Beverly [name] MRN: [id number] DOB: 1945/12/01 Today's Date: 10/08/2023   History of Present Illness Patient is a 78 y/o female admitted 10/07/23 for R patellofemoral arthroplasty.  PMH positive for GERD, OA, PNA, basal cell CA, previous THA bilat and R RCR.    PT Comments  Patient progressing despite increased pain this pm.  Able to negotiate steps with walker reverse technique with walker and spouse assisting.  Feel she is stable for home if medically ready.  PT will follow up until d/c.     If plan is discharge home, recommend the following: A little help with walking and/or transfers;A little help with bathing/dressing/bathroom;Assistance with cooking/housework;Assist for transportation;Help with stairs or ramp for entrance   Can travel by private vehicle        Equipment Recommendations  None recommended by PT    Recommendations for Other Services       Precautions / Restrictions Precautions Precautions: Fall;Knee Recall of Precautions/Restrictions: Intact Required Braces or Orthoses: Knee Immobilizer - Right Restrictions Weight Bearing Restrictions Per Provider Order: No Other Position/Activity Restrictions: WBAT     Mobility  Bed Mobility Overal bed mobility: Needs Assistance Bed Mobility: Sit to Supine     Supine to sit: Contact guard Sit to supine: Contact guard assist   General bed mobility comments: up in bathroom upon entry, to supine with A for positioning    Transfers Overall transfer level: Needs assistance Equipment used: Rolling walker (2 wheels) Transfers: Sit to/from Stand Sit to Stand: Supervision           General transfer comment: cues for R LE management    Ambulation/Gait Ambulation/Gait assistance: Supervision Gait Distance (Feet): 120 Feet Assistive device: Rolling walker (2 wheels) Gait Pattern/deviations: Step-through pattern, Decreased stride length, Antalgic       General Gait  Details: from ortho gym to room; rode in recliner to gym for stairs practice   Stairs Stairs: Yes Stairs assistance: Min assist Stair Management: Step to pattern, Backwards, With walker Number of Stairs: 2 General stair comments: reverse technique with demo and spouse present to hold walker   Wheelchair Mobility     Tilt Bed    Modified Rankin (Stroke Patients Only)       Balance Overall balance assessment: Needs assistance Sitting-balance support: Feet supported Sitting balance-Leahy Scale: Good     Standing balance support: Bilateral upper extremity supported, Reliant on assistive device for balance Standing balance-Leahy Scale: Fair Standing balance comment: can let go of walker, spouse taking her to bathroom                            Communication Communication Communication: No apparent difficulties  Cognition Arousal: Alert Behavior During Therapy: WFL for tasks assessed/performed   PT - Cognitive impairments: No apparent impairments                         Following commands: Intact      Cueing Cueing Techniques: Verbal cues  Exercises Total Joint Exercises Ankle Circles/Pumps: AROM, 5 reps, Supine, Both Quad Sets: AROM, Right, 5 reps, Supine Short Arc Quad: AROM, Right, 5 reps, Seated Heel Slides: AROM, Right, 5 reps, Seated    General Comments General comments (skin integrity, edema, etc.): Issued HEP and reviewed verbally and issued handout on stairs, Discussed plans for CPM, coordinating with HEP and meds and follow up PT.      Pertinent  Vitals/Pain Pain Assessment Pain Assessment: Faces Faces Pain Scale: Hurts even more Pain Location: R knee Pain Descriptors / Indicators: Aching, Sore Pain Intervention(s): Monitored during session, Repositioned, Ice applied, Premedicated before session    Home Living Family/patient expects to be discharged to:: Private residence Living Arrangements: Spouse/significant other Available  Help at Discharge: Family Type of Home: House Home Access: Stairs to enter Entrance Stairs-Rails: None Secretary/administrator of Steps: 2   Home Layout: One level Home Equipment: Teacher, English as a foreign language (2 wheels)      Prior Function            PT Goals (current goals can now be found in the care plan section) Acute Rehab PT Goals Patient Stated Goal: return home PT Goal Formulation: With patient/family Time For Goal Achievement: 10/13/23 Potential to Achieve Goals: Good Progress towards PT goals: Progressing toward goals    Frequency    7X/week      PT Plan      Co-evaluation              AM-PAC PT "6 Clicks" Mobility   Outcome Measure  Help needed turning from your back to your side while in a flat bed without using bedrails?: None Help needed moving from lying on your back to sitting on the side of a flat bed without using bedrails?: A Little Help needed moving to and from a bed to a chair (including a wheelchair)?: A Little Help needed standing up from a chair using your arms (e.g., wheelchair or bedside chair)?: A Little Help needed to walk in hospital room?: A Little Help needed climbing 3-5 steps with a railing? : A Little 6 Click Score: 19    End of Session Equipment Utilized During Treatment: Gait belt Activity Tolerance: Patient limited by pain Patient left: in bed;with call bell/phone within reach   PT Visit Diagnosis: Difficulty in walking, not elsewhere classified (R26.2);Pain Pain - Right/Left: Right Pain - part of body: Knee     Time: 9604-5409 PT Time Calculation (min) (ACUTE ONLY): 25 min  Charges:    $Gait Training: 23-37 mins PT General Charges $$ ACUTE PT VISIT: 1 Visit                     Abigail Hoff, PT Acute Rehabilitation Services Office:[phone] 10/08/2023    Beverly [name] 10/08/2023, 3:41 PM

## 2023-10-08 NOTE — Care Management Obs Status (Signed)
 MEDICARE OBSERVATION STATUS NOTIFICATION   Patient Details  Name: Beverly [name] MRN: [id number] Date of Birth: February 27, 1946   Medicare Observation Status Notification Given:  Yes    Omie Bickers, RN 10/08/2023, 3:48 PM

## 2023-10-08 NOTE — Op Note (Signed)
 Beverly [name], Beverly [name] MEDICAL RECORD NO: [id number] ACCOUNT NO: [id number] DATE OF BIRTH: 18-Oct-1945 FACILITY: [facility] LOCATION: [facility] PHYSICIAN: Gloriann Larger. Rozelle Corning, MD  Operative Report   DATE OF PROCEDURE: 10/07/2023  PREOPERATIVE DIAGNOSIS:  Right knee patellofemoral arthritis.  POSTOPERATIVE DIAGNOSIS:  Right knee patellofemoral arthritis.  PROCEDURES PERFORMED:  Right patellofemoral replacement using Arthrosurface with a cemented 7.5 mm stud and 8.5 mm x 5 mm offset femoral component with size 29 cemented Stryker patella, asymmetric.  SURGEON:  Gloriann Larger. Rozelle Corning, MD  ASSISTANT:  Van Gelinas Magnet, PA  INDICATIONS:  The patient is a 77 year old with right patellofemoral arthritis refractory to nonoperative management who presents for operative management after explanation of risks and benefits.  DESCRIPTION OF PROCEDURE:  The patient was brought to the operating room where a spinal anesthetic was induced and preoperative antibiotics administered.  Timeout was called.  The right knee and leg were prescrubbed with alcohol and Betadine , allowed to  air dry, prepped with DuraPrep solution, and draped in a sterile manner.  Ioban was used to cover the operative field.  After applying timeout, the leg was elevated and exsanguinated with an Esmarch wrap.  The tourniquet was inflated.  An anterior  approach to the knee was made.  Skin and subcutaneous tissue were sharply divided.  Irrisept solution was utilized at this time.  A median parapatellar arthrotomy was made and marked with #1 Vicryl suture.  Irrisept solution was used here as well.  The  patient had severe end-stage patellofemoral arthritis affecting the lateral facet as well as the medial facet as well as the lateral femoral condyle proximally.  The medial and lateral compartments were spared.  Following the approach, the fat pad was  partially excised.  The sizer was placed and a guide pin placed in the trochlea.  Further sizing was done and then  reaming was performed through the guide pin.  Placed a second guide onto the reamed size 5 mm crater, which was reamed.  This second guide  fit within the reamed circle, which was present.  Oblong reaming was then performed at that time.  A trial size component was then set into the prepared bed and it fit nicely.  Final reaming preparations were made with the Peg reamer.  At this time, the  trial set flush or slightly recessed to the patient's native joint.  Attention was then directed towards the patella.  The patella was cut down from 19 to 10 mm and a 3 Peg trial was placed.  The patient had very good range of motion and tracking with  trial components.  Trial components were removed and thorough irrigation was performed.  Cement was placed on the dried surface on both the femur and the patella.  Components were cemented into position with same stability parameters maintained.  Cement  hardening was allowed to occur.  Excess cement was removed.  The patient had full range of motion with excellent patellar tracking using no thumb technique.  At this time, the tourniquet was released.  Bleeding points were cauterized using  electrocautery.  Thorough irrigation was performed with 3 liters of irrigating solution followed by Irrisept solution, which was allowed to sit in the knee prior to letting the tourniquet down for about 3 minutes.  We also numbed up the capsule using a  solution of Marcaine , Exparel  and saline.  Before final arthrotomy closure, we irrigated the knee out with Irrisept solution and placed in some vancomycin powder.  Final arthrotomy closure was then completed using  the #1 Vicryl suture.  We then injected  a solution of Marcaine , morphine, and clonidine into the knee for postop pain relief.  Skin was then closed using 0-Vicryl suture, 2-0 Vicryl suture and 3-0 Monocryl with Steri-Strips, Aquacel dressing, Ace wrap, and knee immobilizer applied.  The  patient tolerated the procedure well  without immediate complications.  The patient were transferred to the recovery room in stable condition.  Luke's assistance was required at all times for retraction, opening, closing, and mobilization of tissue.  His  assistance was a medical necessity.    [facility] D: 10/07/2023 3:20:11 pm T: 10/08/2023 2:59:00 am  JOB: [id number]/ [id number]

## 2023-10-10 ENCOUNTER — Encounter (HOSPITAL_COMMUNITY): Payer: Self-pay | Admitting: Orthopedic Surgery

## 2023-10-10 ENCOUNTER — Telehealth: Payer: Self-pay | Admitting: Radiology

## 2023-10-10 DIAGNOSIS — K219 Gastro-esophageal reflux disease without esophagitis: Secondary | ICD-10-CM | POA: Diagnosis not present

## 2023-10-10 DIAGNOSIS — Z7982 Long term (current) use of aspirin: Secondary | ICD-10-CM | POA: Diagnosis not present

## 2023-10-10 DIAGNOSIS — Z8701 Personal history of pneumonia (recurrent): Secondary | ICD-10-CM | POA: Diagnosis not present

## 2023-10-10 DIAGNOSIS — H6121 Impacted cerumen, right ear: Secondary | ICD-10-CM | POA: Diagnosis not present

## 2023-10-10 DIAGNOSIS — F5104 Psychophysiologic insomnia: Secondary | ICD-10-CM | POA: Diagnosis not present

## 2023-10-10 DIAGNOSIS — Z85828 Personal history of other malignant neoplasm of skin: Secondary | ICD-10-CM | POA: Diagnosis not present

## 2023-10-10 DIAGNOSIS — R32 Unspecified urinary incontinence: Secondary | ICD-10-CM | POA: Diagnosis not present

## 2023-10-10 DIAGNOSIS — Z471 Aftercare following joint replacement surgery: Secondary | ICD-10-CM | POA: Diagnosis not present

## 2023-10-10 DIAGNOSIS — Z96659 Presence of unspecified artificial knee joint: Secondary | ICD-10-CM | POA: Diagnosis not present

## 2023-10-10 DIAGNOSIS — G2581 Restless legs syndrome: Secondary | ICD-10-CM | POA: Diagnosis not present

## 2023-10-10 DIAGNOSIS — Z96643 Presence of artificial hip joint, bilateral: Secondary | ICD-10-CM | POA: Diagnosis not present

## 2023-10-10 DIAGNOSIS — K5900 Constipation, unspecified: Secondary | ICD-10-CM | POA: Diagnosis not present

## 2023-10-10 DIAGNOSIS — K449 Diaphragmatic hernia without obstruction or gangrene: Secondary | ICD-10-CM | POA: Diagnosis not present

## 2023-10-10 DIAGNOSIS — J439 Emphysema, unspecified: Secondary | ICD-10-CM | POA: Diagnosis not present

## 2023-10-10 NOTE — Telephone Encounter (Signed)
 Maria from [facility], is calling to get verbal HHPT orders for 1 week x 1, 2 week x 3. Also she wanted to let Dr. Rozelle Corning know that the patient is NOT taking the ASA 81 mg, that she is taking Ibuprofen since it helps with the pain and it has a blood thinner effect to it. Please call Shelagh Derrick back to advise @ [phone]

## 2023-10-13 ENCOUNTER — Telehealth: Payer: Self-pay | Admitting: Orthopedic Surgery

## 2023-10-13 NOTE — Telephone Encounter (Signed)
 Okay for HHPT but she needs to take aspirin  in addition to ibuprofen to help prevent DVT

## 2023-10-13 NOTE — Telephone Encounter (Signed)
 Patient called. Says she has not had a BM.

## 2023-10-13 NOTE — Telephone Encounter (Signed)
 Gave ok for PT. And called pt to stress the importance of taking the aspirin . She stated understanding and would get some to take.

## 2023-10-14 ENCOUNTER — Encounter: Payer: Self-pay | Admitting: Orthopedic Surgery

## 2023-10-15 ENCOUNTER — Other Ambulatory Visit: Payer: Self-pay | Admitting: Surgical

## 2023-10-15 MED ORDER — ONDANSETRON HCL 4 MG PO TABS: 4.0000 mg | ORAL_TABLET | Freq: Three times a day (TID) | ORAL | 0 refills | Status: DC | PRN

## 2023-10-15 NOTE — Telephone Encounter (Signed)
 Spoke with Shelagh Derrick. She states that she saw patient on Friday and therapy is scheduled to go out tomorrow, Friday, and Saturday. She is going to have office reach out to patient. I will send patient message as well, as soon as I find out about CPM.

## 2023-10-15 NOTE — Telephone Encounter (Signed)
 Beverly [name], can you see what is going on with home health physical therapy for this patient?  Sounds like she has not heard anything from them.

## 2023-10-15 NOTE — Telephone Encounter (Signed)
 I left voicemail for Shelagh Derrick with [facility] at number listed in chart requesting return call.

## 2023-10-16 DIAGNOSIS — Z471 Aftercare following joint replacement surgery: Secondary | ICD-10-CM | POA: Diagnosis not present

## 2023-10-16 DIAGNOSIS — J439 Emphysema, unspecified: Secondary | ICD-10-CM | POA: Diagnosis not present

## 2023-10-16 DIAGNOSIS — K449 Diaphragmatic hernia without obstruction or gangrene: Secondary | ICD-10-CM | POA: Diagnosis not present

## 2023-10-16 DIAGNOSIS — K219 Gastro-esophageal reflux disease without esophagitis: Secondary | ICD-10-CM | POA: Diagnosis not present

## 2023-10-16 DIAGNOSIS — K5900 Constipation, unspecified: Secondary | ICD-10-CM | POA: Diagnosis not present

## 2023-10-16 DIAGNOSIS — G2581 Restless legs syndrome: Secondary | ICD-10-CM | POA: Diagnosis not present

## 2023-10-18 DIAGNOSIS — K449 Diaphragmatic hernia without obstruction or gangrene: Secondary | ICD-10-CM | POA: Diagnosis not present

## 2023-10-18 DIAGNOSIS — G2581 Restless legs syndrome: Secondary | ICD-10-CM | POA: Diagnosis not present

## 2023-10-18 DIAGNOSIS — J439 Emphysema, unspecified: Secondary | ICD-10-CM | POA: Diagnosis not present

## 2023-10-18 DIAGNOSIS — K219 Gastro-esophageal reflux disease without esophagitis: Secondary | ICD-10-CM | POA: Diagnosis not present

## 2023-10-18 DIAGNOSIS — K5900 Constipation, unspecified: Secondary | ICD-10-CM | POA: Diagnosis not present

## 2023-10-18 DIAGNOSIS — Z471 Aftercare following joint replacement surgery: Secondary | ICD-10-CM | POA: Diagnosis not present

## 2023-10-19 DIAGNOSIS — M2241 Chondromalacia patellae, right knee: Secondary | ICD-10-CM

## 2023-10-20 DIAGNOSIS — J439 Emphysema, unspecified: Secondary | ICD-10-CM | POA: Diagnosis not present

## 2023-10-20 DIAGNOSIS — K449 Diaphragmatic hernia without obstruction or gangrene: Secondary | ICD-10-CM | POA: Diagnosis not present

## 2023-10-20 DIAGNOSIS — K219 Gastro-esophageal reflux disease without esophagitis: Secondary | ICD-10-CM | POA: Diagnosis not present

## 2023-10-20 DIAGNOSIS — Z471 Aftercare following joint replacement surgery: Secondary | ICD-10-CM | POA: Diagnosis not present

## 2023-10-20 DIAGNOSIS — K5900 Constipation, unspecified: Secondary | ICD-10-CM | POA: Diagnosis not present

## 2023-10-20 DIAGNOSIS — G2581 Restless legs syndrome: Secondary | ICD-10-CM | POA: Diagnosis not present

## 2023-10-22 ENCOUNTER — Encounter: Payer: Self-pay | Admitting: Orthopedic Surgery

## 2023-10-22 ENCOUNTER — Other Ambulatory Visit (INDEPENDENT_AMBULATORY_CARE_PROVIDER_SITE_OTHER)

## 2023-10-22 ENCOUNTER — Ambulatory Visit (INDEPENDENT_AMBULATORY_CARE_PROVIDER_SITE_OTHER): Admitting: Orthopedic Surgery

## 2023-10-22 DIAGNOSIS — G2581 Restless legs syndrome: Secondary | ICD-10-CM | POA: Diagnosis not present

## 2023-10-22 DIAGNOSIS — K5900 Constipation, unspecified: Secondary | ICD-10-CM | POA: Diagnosis not present

## 2023-10-22 DIAGNOSIS — K219 Gastro-esophageal reflux disease without esophagitis: Secondary | ICD-10-CM | POA: Diagnosis not present

## 2023-10-22 DIAGNOSIS — K449 Diaphragmatic hernia without obstruction or gangrene: Secondary | ICD-10-CM | POA: Diagnosis not present

## 2023-10-22 DIAGNOSIS — Z9889 Other specified postprocedural states: Secondary | ICD-10-CM

## 2023-10-22 DIAGNOSIS — Z471 Aftercare following joint replacement surgery: Secondary | ICD-10-CM | POA: Diagnosis not present

## 2023-10-22 DIAGNOSIS — J439 Emphysema, unspecified: Secondary | ICD-10-CM | POA: Diagnosis not present

## 2023-10-22 NOTE — Progress Notes (Signed)
 Post-Op Visit Note   Patient: Beverly [name]           Date of Birth: Oct 19, 1945           MRN: [id number] Visit Date: 10/22/2023 PCP: Beverly Bugler, MD   Assessment & Plan:  Chief Complaint:  Chief Complaint  Patient presents with   Right Knee - Routine Post Op    10/07/23 right PF arthroplasty   Visit Diagnoses:  1. S/P knee surgery     Plan: [name] is a 78 year old patient underwent right patellofemoral arthroplasty 10/07/2023.  She is doing well.  Ambulating with a walker.  On exam 106 on CPM and she has that as well.  A little calf tenderness but negative Homans.  Ultrasound at the time of this dictation negative for DVT.  She is doing home health PT.  I would like for her to start physical therapy here for right knee quad strengthening and range of motion 1-2 times a week for 6 weeks.  4-week return for clinical recheck.  Follow-Up Instructions: No follow-ups on file.   Orders:  Orders Placed This Encounter  Procedures   XR Knee 1-2 Views Right   No orders of the defined types were placed in this encounter.   Imaging: No results found.  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   Primary osteoarthritis of left hip 12/07/2018   Primary osteoarthritis of right hip 12/07/2018   Pain of right hip joint 05/21/2017   Trochanteric bursitis, right hip 04/23/2017   Pain in right hip 04/23/2017   Hearing loss of right ear due to cerumen impaction 10/25/2016   Chronic rhinitis 07/04/2015   Cough 07/04/2015   Endometrial polyp 01/11/2011   Past Medical History:  Diagnosis Date   Atypical mole  11/03/2013   R prox post lat thigh    Basal cell carcinoma    mid back   Basal cell carcinoma 04/01/2019   L chest parasternal    Basal cell carcinoma 09/11/2017   L mid back at braline 3.0 cm lat to spine   Emphysema lung (HCC)    Mild no symptoms Found on coronary CT scan   Esophageal dysmotility    GERD (gastroesophageal reflux disease)    occassional   History of hiatal hernia    OA (osteoarthritis)    hips and knees   Pneumonia    history of    PONV (postoperative nausea and vomiting)    Squamous cell carcinoma of skin 06/11/2021   L mid pretibia, EDC    Family History  Problem Relation Age of Onset   Allergic rhinitis Neg Hx    Angioedema Neg Hx    Asthma Neg Hx    Atopy Neg Hx    Eczema Neg Hx    Immunodeficiency Neg Hx    Urticaria Neg Hx     Past Surgical History:  Procedure Laterality Date   COLONOSCOPY     FRACTURE SURGERY  2009   left ankle   INGUINAL HERNIA REPAIR Right 08/22/2021   Procedure: OPEN RIGHT INGUINAL HERNIA REPAIR WITH MESH;  Surgeon: Adalberto Acton, MD;  Location: [facility];  Service: General;  Laterality: Right;   LAPAROSCOPY  1992   infection after tubal   PATELLA-FEMORAL ARTHROPLASTY Right 10/07/2023   Procedure: RIGHT KNEE PATELLOFEMORAL ARTHROPLASTY;  Surgeon: Jasmine Mesi, MD;  Location: [facility];  Service: Orthopedics;  Laterality: Right;   SHOULDER OPEN ROTATOR CUFF REPAIR Right 2009   TOTAL HIP ARTHROPLASTY Left 01/29/2019   Procedure: LEFT TOTAL HIP ARTHROPLASTY ANTERIOR APPROACH;  Surgeon: Arnie Lao, MD;  Location: [facility];  Service: Orthopedics;  Laterality: Left;   TOTAL HIP ARTHROPLASTY Right 09/24/2019   Procedure: RIGHT TOTAL HIP ARTHROPLASTY ANTERIOR APPROACH;  Surgeon: Arnie Lao, MD;  Location: [facility];  Service: Orthopedics;  Laterality: Right;   TUBAL LIGATION  1992   Social History   Occupational History   Not on file  Tobacco Use   Smoking status: Former    Current packs/day: 0.00     Types: Cigarettes    Quit date: 12/27/1979    Years since quitting: 43.8   Smokeless tobacco: Never  Vaping Use   Vaping status: Never Used  Substance and Sexual Activity   Alcohol use: Yes    Alcohol/week: 1.0 standard drink of alcohol    Types: 1 Glasses of wine per week    Comment: 1-2 a week   Drug use: No   Sexual activity: Not Currently    Birth control/protection: Post-menopausal

## 2023-10-23 ENCOUNTER — Ambulatory Visit (HOSPITAL_COMMUNITY)
Admission: RE | Admit: 2023-10-23 | Discharge: 2023-10-23 | Disposition: A | Discharge status: Home / Self Care | Source: Ambulatory Visit | Attending: Vascular Surgery | Admitting: Vascular Surgery

## 2023-10-23 DIAGNOSIS — Z9889 Other specified postprocedural states: Secondary | ICD-10-CM | POA: Diagnosis not present

## 2023-10-23 NOTE — Telephone Encounter (Signed)
 Thx neg

## 2023-10-23 NOTE — Telephone Encounter (Signed)
 Pls order u/s to r/o dvt affected leg thx

## 2023-10-23 NOTE — Telephone Encounter (Signed)
 FYI pt is having u/s today at 2

## 2023-10-23 NOTE — Telephone Encounter (Signed)
 Order placed in chart. LVM for vascular scheduling to cb to get pt scheduled

## 2023-10-24 DIAGNOSIS — J439 Emphysema, unspecified: Secondary | ICD-10-CM | POA: Diagnosis not present

## 2023-10-24 DIAGNOSIS — Z471 Aftercare following joint replacement surgery: Secondary | ICD-10-CM | POA: Diagnosis not present

## 2023-10-24 DIAGNOSIS — K449 Diaphragmatic hernia without obstruction or gangrene: Secondary | ICD-10-CM | POA: Diagnosis not present

## 2023-10-24 DIAGNOSIS — G2581 Restless legs syndrome: Secondary | ICD-10-CM | POA: Diagnosis not present

## 2023-10-24 DIAGNOSIS — K5900 Constipation, unspecified: Secondary | ICD-10-CM | POA: Diagnosis not present

## 2023-10-24 DIAGNOSIS — K219 Gastro-esophageal reflux disease without esophagitis: Secondary | ICD-10-CM | POA: Diagnosis not present

## 2023-10-24 NOTE — Telephone Encounter (Signed)
 Called.

## 2023-10-28 DIAGNOSIS — I700 Atherosclerosis of aorta: Secondary | ICD-10-CM | POA: Diagnosis not present

## 2023-10-28 DIAGNOSIS — J439 Emphysema, unspecified: Secondary | ICD-10-CM | POA: Diagnosis not present

## 2023-10-28 DIAGNOSIS — I2510 Atherosclerotic heart disease of native coronary artery without angina pectoris: Secondary | ICD-10-CM | POA: Diagnosis not present

## 2023-10-29 ENCOUNTER — Encounter: Payer: Self-pay | Admitting: Physical Therapy

## 2023-10-29 ENCOUNTER — Ambulatory Visit (INDEPENDENT_AMBULATORY_CARE_PROVIDER_SITE_OTHER): Admitting: Physical Therapy

## 2023-10-29 DIAGNOSIS — R600 Localized edema: Secondary | ICD-10-CM | POA: Diagnosis not present

## 2023-10-29 DIAGNOSIS — R2689 Other abnormalities of gait and mobility: Secondary | ICD-10-CM | POA: Diagnosis not present

## 2023-10-29 DIAGNOSIS — R2681 Unsteadiness on feet: Secondary | ICD-10-CM

## 2023-10-29 DIAGNOSIS — M25661 Stiffness of right knee, not elsewhere classified: Secondary | ICD-10-CM

## 2023-10-29 DIAGNOSIS — M6281 Muscle weakness (generalized): Secondary | ICD-10-CM

## 2023-10-29 DIAGNOSIS — M25561 Pain in right knee: Secondary | ICD-10-CM | POA: Diagnosis not present

## 2023-10-29 NOTE — Therapy (Signed)
 OUTPATIENT PHYSICAL THERAPY EVALUATION   Patient Name: Beverly [name] MRN: [id number] DOB:[birth year], 78 y.o., female Today's Date: 10/29/2023  END OF SESSION:  PT End of Session - 10/29/23 1419     Visit Number 1    Number of Visits 8    Date for PT Re-Evaluation 11/26/23    Authorization Type Medicare/[employer]    PT Start Time 1345    PT Stop Time 1419    PT Time Calculation (min) 34 min    Activity Tolerance Patient tolerated treatment well    Behavior During Therapy [facility] for tasks assessed/performed             Past Medical History:  Diagnosis Date   Atypical mole 11/03/2013   R prox post lat thigh    Basal cell carcinoma    mid back   Basal cell carcinoma 04/01/2019   L chest parasternal    Basal cell carcinoma 09/11/2017   L mid back at braline 3.0 cm lat to spine   Emphysema lung (HCC)    Mild no symptoms Found on coronary CT scan   Esophageal dysmotility    GERD (gastroesophageal reflux disease)    occassional   History of hiatal hernia    OA (osteoarthritis)    hips and knees   Pneumonia    history of    PONV (postoperative nausea and vomiting)    Squamous cell carcinoma of skin 06/11/2021   L mid pretibia, [facility]   Past Surgical History:  Procedure Laterality Date   COLONOSCOPY     FRACTURE SURGERY  2009   left ankle   INGUINAL HERNIA REPAIR Right 08/22/2021   Procedure: OPEN RIGHT INGUINAL HERNIA REPAIR WITH MESH;  Surgeon: Adalberto Acton, MD;  Location: [facility];  Service: General;  Laterality: Right;   LAPAROSCOPY  1992   infection after tubal   PATELLA-FEMORAL ARTHROPLASTY Right 10/07/2023   Procedure: RIGHT KNEE PATELLOFEMORAL ARTHROPLASTY;  Surgeon: Jasmine Mesi, MD;  Location: [facility] OR;  Service: Orthopedics;  Laterality: Right;   SHOULDER OPEN ROTATOR CUFF REPAIR Right 2009   TOTAL HIP ARTHROPLASTY Left 01/29/2019   Procedure: LEFT TOTAL HIP ARTHROPLASTY ANTERIOR APPROACH;  Surgeon: Arnie Lao, MD;  Location: [facility];  Service:  Orthopedics;  Laterality: Left;   TOTAL HIP ARTHROPLASTY Right 09/24/2019   Procedure: RIGHT TOTAL HIP ARTHROPLASTY ANTERIOR APPROACH;  Surgeon: Arnie Lao, MD;  Location: [facility];  Service: Orthopedics;  Laterality: Right;   TUBAL LIGATION  1992   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   S/P right unicompartmental knee replacement 10/07/2023   Restless leg 06/11/2023   Dizziness 03/25/2023   Gait abnormality 03/25/2023   Chronic insomnia 03/25/2023   Abnormal CT of the chest 02/08/2021   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   Primary osteoarthritis of left hip 12/07/2018   Primary osteoarthritis of right hip 12/07/2018   Pain of right hip joint 05/21/2017   Trochanteric bursitis, right hip 04/23/2017   Pain in right hip 04/23/2017   Hearing loss of right ear due to cerumen impaction 10/25/2016   Chronic rhinitis 07/04/2015   Cough 07/04/2015   Endometrial polyp 01/11/2011    PCP: Rae Bugler, MD  REFERRING PROVIDER: Jasmine Mesi, MD  REFERRING DIAG: [phone] (ICD-10-CM) - S/P knee surgery  Rationale for Evaluation and Treatment: Rehabilitation  THERAPY DIAG:  Acute pain of  right knee - Plan: PT plan of care cert/re-cert  Stiffness of right knee, not elsewhere classified - Plan: PT plan of care cert/re-cert  Muscle weakness (generalized) - Plan: PT plan of care cert/re-cert  Unsteadiness on feet - Plan: PT plan of care cert/re-cert  Other abnormalities of gait and mobility - Plan: PT plan of care cert/re-cert  Localized edema - Plan: PT plan of care cert/re-cert  ONSET DATE: DOS: 10/07/23   SUBJECTIVE:                                                                                                                                                                                           SUBJECTIVE STATEMENT: Beverly [name] is a  78 year old patient underwent right patellofemoral arthroplasty 10/07/2023.  She had some HHPT and is amb today without an AD.  PERTINENT HISTORY:  PMH positive for GERD, OA, PNA, basal cell CA, previous THA bilat and R RCR  PAIN:  Are you having pain? Yes: NPRS scale: 0 currently, up to 10 (only in CPM), on average 2/10 Pain location: Rt knee Pain description: aching, tightness Aggravating factors: worse at night, CPM Relieving factors: ibuprofen, tylenol  QD  PRECAUTIONS:  None  RED FLAGS: None   WEIGHT BEARING RESTRICTIONS:  No  FALLS:  Has patient fallen in last 6 months? No  LIVING ENVIRONMENT: Lives with: lives with their spouse Lives in: House/apartment Stairs: Yes: External: 2 steps; none Has following equipment at home: Otho Blitz - 2 wheeled  OCCUPATION:  Retired - Furniture conservator/restorer for practices  PLOF:  Independent and Leisure: hiking, golfing, mountains, visit grandchildren  PATIENT GOALS:  Museum/gallery exhibitions officer, climb mountains   OBJECTIVE:  Note: Objective measures were completed at Evaluation unless otherwise noted.   PATIENT SURVEYS:  Patient-Specific Activity Scoring Scheme  "0" represents "unable to perform." "10" represents "able to perform at prior level. 0 1 2 3 4 5 6 7 8 9  10 (Date and Score)   Activity Eval     1. Hiking 0    2. Golfing 0    3. Driving 0   Score 0    Total score = sum of the activity scores/number of activities Minimum detectable change (90%CI) for average score = 2 points Minimum detectable change (90%CI) for single activity score = 3 points    COGNITIVE STATUS: Within functional limits for tasks assessed   SENSATION: WFL  POSTURE:  No Significant postural limitations   GAIT: 10/29/23 Comments: amb without AD; mild antalgic gait on Rt  EDEMA: 10/29/23 Rt knee: 37 cm/Lt knee: 33.7 cm   LOWER EXTREMITY ROM:     ROM Right eval Left eval  Knee flexion A: 119 P: 123 A: 136  Knee extension A: -2 (seated LAQ) P:  0 A: 0 (seated LAQ)   (Blank rows = not tested)   LOWER EXTREMITY MMT:    10/29/23: Not formally tested at eval; grossly 3-/5 Rt knee strength  MMT Right eval Left eval  Hip flexion    Hip extension    Hip abduction    Hip adduction    Hip internal rotation    Hip external rotation    Knee flexion    Knee extension    Ankle dorsiflexion    Ankle plantarflexion    Ankle inversion    Ankle eversion     (Blank rows = not tested)     FUNCTIONAL TESTS:  10/29/23  5 times sit to stand: 15.22 with UE support SLS: LLE: 8 sec; RLE: 8 sec    TREATMENT:                                                                                                                              DATE:  10/29/23 TherEx See HEP - demonstrated with trial reps performed PRN, min cues for comprehension    Self Care Educated on clinical findings and PT POC     PATIENT EDUCATION:  Education details: HEP Person educated: Patient Education method: Programmer, multimedia, Facilities manager, and Handouts Education comprehension: verbalized understanding, returned demonstration, and needs further education  HOME EXERCISE PROGRAM: Access Code: [email or web address] URL: [email or web address] Date: 10/29/2023 Prepared by: Casimer Clear  Exercises - Sit to Stand  - 2-3 x daily - 7 x weekly - 1 sets - 10 reps - Supine Heel Slide with Strap  - 2-3 x daily - 7 x weekly - 1 sets - 10 reps - Supine Bridge  - 2-3 x daily - 7 x weekly - 1 sets - 10 reps - 5 sec hold - Seated Straight Leg Raise  - 2-3 x daily - 7 x weekly - 1 sets - 10 reps - 3-5 sec hold - Standing Single Leg Stance with Counter Support  - 2-3 x daily - 7 x weekly - 1 sets - 5 reps - 10-15 sec hold   ASSESSMENT:  CLINICAL IMPRESSION: Patient is a 78 y.o. female who was seen today for physical therapy evaluation and treatment for Rt patellofemoral arthroplasty. She demonstrates mild ROM and strength deficits, gait abnormalities and decreased  balance as well as expected post op pain and swelling affecting functional mobility.  She will benefit from PT to address deficits listed.     OBJECTIVE IMPAIRMENTS: Abnormal gait, decreased balance, decreased mobility, difficulty walking, decreased ROM, decreased strength, hypomobility, increased edema, increased fascial restrictions, increased muscle spasms, and pain.   ACTIVITY LIMITATIONS: carrying, lifting, sitting, standing, squatting, sleeping, stairs, transfers, and locomotion level  PARTICIPATION LIMITATIONS: meal prep, cleaning, laundry, driving, shopping, community activity, and yard work  PERSONAL FACTORS: 3+ comorbidities: GERD, OA, PNA, basal cell CA, previous THA bilat and R  RCR are also affecting patient's functional outcome.   REHAB POTENTIAL: Good  CLINICAL DECISION MAKING: Stable/uncomplicated  EVALUATION COMPLEXITY: Low   GOALS: Goals reviewed with patient? Yes  LONG TERM GOALS: Target date: 11/26/2023  Independent with final HEP Goal status: INITIAL  2.  PSFS score improved by 3 points Goal status: INITIAL  3.  Rt knee AROM improved to 0-130 for improved function and mobility Goal status: INIITAL  4.  Report pain < 2/10 with walking outdoors for improved function Goal status: INITIAL  5.  Demonstrate ability to stand without UE support at least 3 reps for improved functional strength Goal status: INITIAL    PLAN:  PT FREQUENCY: 1-2x/week  PT DURATION: 4 weeks  PLANNED INTERVENTIONS: 97164- PT Re-evaluation, 97750- Physical Performance Testing, 97110-Therapeutic exercises, 97530- Therapeutic activity, [id number]- Neuromuscular re-education, 97535- Self Care, 01027- Manual therapy, [id number]- Gait training, [phone]- Electrical stimulation (unattended), 97016- Vasopneumatic device, Patient/Family education, Balance training, Stair training, Taping, Dry Needling, Joint mobilization, Cryotherapy, and Moist heat.  PLAN FOR NEXT SESSION: Review HEP, work on balance  and strength progressions   NEXT MD VISIT: 11/19/23   Marley Simmers, PT, DPT 10/29/23 3:55 PM

## 2023-10-31 ENCOUNTER — Ambulatory Visit (INDEPENDENT_AMBULATORY_CARE_PROVIDER_SITE_OTHER): Admitting: Surgical

## 2023-10-31 ENCOUNTER — Encounter: Payer: Self-pay | Admitting: Orthopedic Surgery

## 2023-10-31 DIAGNOSIS — Z9889 Other specified postprocedural states: Secondary | ICD-10-CM

## 2023-10-31 NOTE — Telephone Encounter (Signed)
 Appt made. Patient is on her way here.

## 2023-10-31 NOTE — Telephone Encounter (Signed)
 Can she come in for us  to look at incision and possibly remove vicryl suture

## 2023-10-31 NOTE — Telephone Encounter (Signed)
 Ok to come in for incision check today or she can text me a picture at [phone]

## 2023-11-01 DIAGNOSIS — J439 Emphysema, unspecified: Secondary | ICD-10-CM | POA: Diagnosis not present

## 2023-11-01 DIAGNOSIS — I700 Atherosclerosis of aorta: Secondary | ICD-10-CM | POA: Diagnosis not present

## 2023-11-01 DIAGNOSIS — I2510 Atherosclerotic heart disease of native coronary artery without angina pectoris: Secondary | ICD-10-CM | POA: Diagnosis not present

## 2023-11-02 ENCOUNTER — Encounter: Payer: Self-pay | Admitting: Surgical

## 2023-11-02 NOTE — Progress Notes (Signed)
 Post-Op Visit Note   Patient: Beverly [name]           Date of Birth: May 06, 1946           MRN: [id number] Visit Date: 10/31/2023 PCP: Rae Bugler, MD   Assessment & Plan:  Chief Complaint:  Chief Complaint  Patient presents with   Right Knee - Wound Check    10/07/23 right PF arthroplasty   Visit Diagnoses: No diagnosis found.  Plan: Patient is a 78 year old female who comes into the office today following right patellofemoral arthroplasty on 10/07/2023.  Doing well.  Is here today for incision recheck after sending a MyChart message showing some redness and eschar over the superior aspect of the incision.  Small piece of Vicryl suture material seems to be sticking through the eschar very slightly.  This was removed and all the Vicryl material was debridement.  There was no expressible drainage or any sign/symptom of infection aside from some very mild erythema which decreased with elevation of the extremity.  No effusion noted.  Patient has good painless passive range of motion relative to her recovery timeline.  No concern for infection.  She will avoid submersion and avoid direct water  exposure to this 1 area on her incision as it reepithelializes and follow-up in 3 weeks with her next appointment with Dr. Rozelle Corning.  Return precautions discussed and patient will reach out again if she has any concerns.  Follow-Up Instructions: Return in about 3 weeks (around 11/21/2023), or With Dr. Rozelle Corning.   Orders:  No orders of the defined types were placed in this encounter.  No orders of the defined types were placed in this encounter.   Imaging: No results found.  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   S/P right unicompartmental knee replacement 10/07/2023   Restless leg 06/11/2023   Dizziness 03/25/2023   Gait abnormality 03/25/2023   Chronic insomnia 03/25/2023   Abnormal CT of the chest 02/08/2021   Status post  total replacement of right hip 09/24/2019   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   Primary osteoarthritis of left hip 12/07/2018   Primary osteoarthritis of right hip 12/07/2018   Pain of right hip joint 05/21/2017   Trochanteric bursitis, right hip 04/23/2017   Pain in right hip 04/23/2017   Hearing loss of right ear due to cerumen impaction 10/25/2016   Chronic rhinitis 07/04/2015   Cough 07/04/2015   Endometrial polyp 01/11/2011   Past Medical History:  Diagnosis Date   Atypical mole 11/03/2013   R prox post lat thigh    Basal cell carcinoma    mid back   Basal cell carcinoma 04/01/2019   L chest parasternal    Basal cell carcinoma 09/11/2017   L mid back at braline 3.0 cm lat to spine   Emphysema lung (HCC)    Mild no symptoms Found on coronary CT scan   Esophageal dysmotility    GERD (gastroesophageal reflux disease)    occassional   History of hiatal hernia    OA (osteoarthritis)    hips and knees   Pneumonia    history of    PONV (postoperative nausea and vomiting)    Squamous cell carcinoma of skin 06/11/2021   L mid pretibia, EDC    Family History  Problem Relation Age of Onset   Allergic rhinitis Neg Hx    Angioedema Neg Hx    Asthma Neg Hx  Atopy Neg Hx    Eczema Neg Hx    Immunodeficiency Neg Hx    Urticaria Neg Hx     Past Surgical History:  Procedure Laterality Date   COLONOSCOPY     FRACTURE SURGERY  2009   left ankle   INGUINAL HERNIA REPAIR Right 08/22/2021   Procedure: OPEN RIGHT INGUINAL HERNIA REPAIR WITH MESH;  Surgeon: Adalberto Acton, MD;  Location: [facility];  Service: General;  Laterality: Right;   LAPAROSCOPY  1992   infection after tubal   PATELLA-FEMORAL ARTHROPLASTY Right 10/07/2023   Procedure: RIGHT KNEE PATELLOFEMORAL ARTHROPLASTY;  Surgeon: Jasmine Mesi, MD;  Location: [facility];  Service: Orthopedics;  Laterality: Right;   SHOULDER OPEN ROTATOR CUFF REPAIR Right 2009   TOTAL HIP ARTHROPLASTY Left 01/29/2019    Procedure: LEFT TOTAL HIP ARTHROPLASTY ANTERIOR APPROACH;  Surgeon: Arnie Lao, MD;  Location: [facility];  Service: Orthopedics;  Laterality: Left;   TOTAL HIP ARTHROPLASTY Right 09/24/2019   Procedure: RIGHT TOTAL HIP ARTHROPLASTY ANTERIOR APPROACH;  Surgeon: Arnie Lao, MD;  Location: [facility];  Service: Orthopedics;  Laterality: Right;   TUBAL LIGATION  1992   Social History   Occupational History   Not on file  Tobacco Use   Smoking status: Former    Current packs/day: 0.00    Types: Cigarettes    Quit date: 12/27/1979    Years since quitting: 43.8   Smokeless tobacco: Never  Vaping Use   Vaping status: Never Used  Substance and Sexual Activity   Alcohol use: Yes    Alcohol/week: 1.0 standard drink of alcohol    Types: 1 Glasses of wine per week    Comment: 1-2 a week   Drug use: No   Sexual activity: Not Currently    Birth control/protection: Post-menopausal

## 2023-11-05 ENCOUNTER — Ambulatory Visit (INDEPENDENT_AMBULATORY_CARE_PROVIDER_SITE_OTHER): Admitting: Physical Therapy

## 2023-11-05 ENCOUNTER — Encounter: Payer: Self-pay | Admitting: Physical Therapy

## 2023-11-05 DIAGNOSIS — R2681 Unsteadiness on feet: Secondary | ICD-10-CM | POA: Diagnosis not present

## 2023-11-05 DIAGNOSIS — M25661 Stiffness of right knee, not elsewhere classified: Secondary | ICD-10-CM | POA: Diagnosis not present

## 2023-11-05 DIAGNOSIS — R600 Localized edema: Secondary | ICD-10-CM

## 2023-11-05 DIAGNOSIS — M6281 Muscle weakness (generalized): Secondary | ICD-10-CM | POA: Diagnosis not present

## 2023-11-05 DIAGNOSIS — R2689 Other abnormalities of gait and mobility: Secondary | ICD-10-CM | POA: Diagnosis not present

## 2023-11-05 DIAGNOSIS — M25561 Pain in right knee: Secondary | ICD-10-CM

## 2023-11-05 NOTE — Therapy (Signed)
 OUTPATIENT PHYSICAL THERAPY TREATMENT   Patient Name: Beverly [name] MRN: [id number] DOB:1946/01/15, 78 y.o., female Today's Date: 11/05/2023  END OF SESSION:  PT End of Session - 11/05/23 1259     Visit Number 2    Number of Visits 8    Date for PT Re-Evaluation 11/26/23    Authorization Type Medicare/Aetna    PT Start Time 1300    PT Stop Time 1340    PT Time Calculation (min) 40 min    Activity Tolerance Patient tolerated treatment well    Behavior During Therapy [facility] for tasks assessed/performed              Past Medical History:  Diagnosis Date   Atypical mole 11/03/2013   R prox post lat thigh    Basal cell carcinoma    mid back   Basal cell carcinoma 04/01/2019   L chest parasternal    Basal cell carcinoma 09/11/2017   L mid back at braline 3.0 cm lat to spine   Emphysema lung (HCC)    Mild no symptoms Found on coronary CT scan   Esophageal dysmotility    GERD (gastroesophageal reflux disease)    occassional   History of hiatal hernia    OA (osteoarthritis)    hips and knees   Pneumonia    history of    PONV (postoperative nausea and vomiting)    Squamous cell carcinoma of skin 06/11/2021   L mid pretibia, [facility]   Past Surgical History:  Procedure Laterality Date   COLONOSCOPY     FRACTURE SURGERY  2009   left ankle   INGUINAL HERNIA REPAIR Right 08/22/2021   Procedure: OPEN RIGHT INGUINAL HERNIA REPAIR WITH MESH;  Surgeon: Adalberto Acton, MD;  Location: [facility];  Service: General;  Laterality: Right;   LAPAROSCOPY  1992   infection after tubal   PATELLA-FEMORAL ARTHROPLASTY Right 10/07/2023   Procedure: RIGHT KNEE PATELLOFEMORAL ARTHROPLASTY;  Surgeon: Jasmine Mesi, MD;  Location: [facility] OR;  Service: Orthopedics;  Laterality: Right;   SHOULDER OPEN ROTATOR CUFF REPAIR Right 2009   TOTAL HIP ARTHROPLASTY Left 01/29/2019   Procedure: LEFT TOTAL HIP ARTHROPLASTY ANTERIOR APPROACH;  Surgeon: Arnie Lao, MD;  Location: [facility];  Service:  Orthopedics;  Laterality: Left;   TOTAL HIP ARTHROPLASTY Right 09/24/2019   Procedure: RIGHT TOTAL HIP ARTHROPLASTY ANTERIOR APPROACH;  Surgeon: Arnie Lao, MD;  Location: [facility];  Service: Orthopedics;  Laterality: Right;   TUBAL LIGATION  1992   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   S/P right unicompartmental knee replacement 10/07/2023   Restless leg 06/11/2023   Dizziness 03/25/2023   Gait abnormality 03/25/2023   Chronic insomnia 03/25/2023   Abnormal CT of the chest 02/08/2021   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   Primary osteoarthritis of left hip 12/07/2018   Primary osteoarthritis of right hip 12/07/2018   Pain of right hip joint 05/21/2017   Trochanteric bursitis, right hip 04/23/2017   Pain in right hip 04/23/2017   Hearing loss of right ear due to cerumen impaction 10/25/2016   Chronic rhinitis 07/04/2015   Cough 07/04/2015   Endometrial polyp 01/11/2011    PCP: Rae Bugler, MD  REFERRING PROVIDER: Jasmine Mesi, MD  REFERRING DIAG: [phone] (ICD-10-CM) - S/P knee surgery  Rationale for Evaluation and Treatment: Rehabilitation  THERAPY DIAG:  Acute pain  of right knee  Stiffness of right knee, not elsewhere classified  Muscle weakness (generalized)  Unsteadiness on feet  Other abnormalities of gait and mobility  Localized edema  ONSET DATE: DOS: 10/07/23   SUBJECTIVE:                                                                                                                                                                                           SUBJECTIVE STATEMENT: Increased walking distance to 1 mile yesterday and was pretty active.  Reports some increased fatigue today.   PERTINENT HISTORY:  PMH positive for GERD, OA, PNA, basal cell CA, previous THA bilat and R RCR  PAIN:  Are you having  pain? Yes: NPRS scale: 0 currently, up to 10 (only in CPM), on average 2/10 Pain location: Rt knee Pain description: aching, tightness Aggravating factors: worse at night, CPM Relieving factors: ibuprofen, tylenol  QD  PRECAUTIONS:  None  RED FLAGS: None   WEIGHT BEARING RESTRICTIONS:  No  FALLS:  Has patient fallen in last 6 months? No  LIVING ENVIRONMENT: Lives with: lives with their spouse Lives in: House/apartment Stairs: Yes: External: 2 steps; none Has following equipment at home: Otho Blitz - 2 wheeled  OCCUPATION:  Retired - Furniture conservator/restorer for practices  PLOF:  Independent and Leisure: hiking, golfing, mountains, visit grandchildren  PATIENT GOALS:  Museum/gallery exhibitions officer, climb mountains   OBJECTIVE:  Note: Objective measures were completed at Evaluation unless otherwise noted.   PATIENT SURVEYS:  Patient-Specific Activity Scoring Scheme  "0" represents "unable to perform." "10" represents "able to perform at prior level. 0 1 2 3 4 5 6 7 8 9  10 (Date and Score)   Activity Eval     1. Hiking 0    2. Golfing 0    3. Driving 0   Score 0    Total score = sum of the activity scores/number of activities Minimum detectable change (90%CI) for average score = 2 points Minimum detectable change (90%CI) for single activity score = 3 points    COGNITIVE STATUS: Within functional limits for tasks assessed   SENSATION: WFL  POSTURE:  No Significant postural limitations   GAIT: 10/29/23 Comments: amb without AD; mild antalgic gait on Rt  EDEMA: 10/29/23 Rt knee: 37 cm/Lt knee: 33.7 cm   LOWER EXTREMITY ROM:     ROM Right eval Left eval  Knee flexion A: 119 P: 123 A: 136  Knee extension A: -2 (seated LAQ) P: 0 A: 0 (seated LAQ)   (Blank rows = not tested)   LOWER EXTREMITY MMT:    10/29/23: Not formally tested at eval; grossly 3-/5 Rt  knee strength  MMT Right eval Left eval  Hip flexion    Hip extension    Hip abduction    Hip adduction     Hip internal rotation    Hip external rotation    Knee flexion    Knee extension    Ankle dorsiflexion    Ankle plantarflexion    Ankle inversion    Ankle eversion     (Blank rows = not tested)     FUNCTIONAL TESTS:  10/29/23  5 times sit to stand: 15.22 with UE support SLS: LLE: 8 sec; RLE: 8 sec    TREATMENT:                                                                                                                              DATE:  11/05/23 TherEx Recumbent bike L3 x 6 min; seat 6 Incline board calf stretch 3x30 sec Seated LAQ 3# on Rt 2x15 Seated SLR 3# on Rt 2x10  TherAct Standing hip abduction L3 band 2x10 bil Standing hip extension L3 band 2x10 bil Leg press bil 62# 2x15; RLE only 25# x10   Neuro Re-Ed Corner balance activities on foam: Feet apart with horizontal/vertical head turns x 10 each Feet together with horizontal/vertical head turns x 10 each Feet together with EC 3x15 sec  10/29/23 TherEx See HEP - demonstrated with trial reps performed PRN, min cues for comprehension    Self Care Educated on clinical findings and PT POC     PATIENT EDUCATION:  Education details: HEP Person educated: Patient Education method: Programmer, multimedia, Facilities manager, and Handouts Education comprehension: verbalized understanding, returned demonstration, and needs further education  HOME EXERCISE PROGRAM: Access Code: [email or web address] URL: [email or web address] Date: 11/05/2023 Prepared by: Casimer Clear  Exercises - Sit to Stand  - 2-3 x daily - 7 x weekly - 1 sets - 10 reps - Supine Heel Slide with Strap  - 2-3 x daily - 7 x weekly - 1 sets - 10 reps - Supine Bridge  - 2-3 x daily - 7 x weekly - 1 sets - 10 reps - 5 sec hold - Seated Straight Leg Raise  - 2-3 x daily - 7 x weekly - 1 sets - 10 reps - 3-5 sec hold - Standing Single Leg Stance with Counter Support  - 2-3 x daily - 7 x weekly - 1 sets - 5 reps - 10-15 sec hold - Standing Hip Abduction  with Resistance at Ankles and Counter Support  - 1 x daily - 7 x weekly - 2 sets - 10 reps - Standing Hip Extension with Resistance at Ankles and Counter Support  - 1 x daily - 7 x weekly - 2 sets - 10 reps - Romberg Stance with Head Nods on Foam Pad  - 1 x daily - 7 x weekly - 1 sets - 1 reps - 10 rotations each way - Romberg Stance Eyes Closed on Foam Pad  - 1  x daily - 7 x weekly - 1 sets - 5 reps - 10-15 sec hold   ASSESSMENT:  CLINICAL IMPRESSION: Pt tolerated session well today and progressed balance exercises today with positive response.  She also tolerated strengthening exercises well without significant difficulty.  Will continue to benefit from PT to maximize function.   OBJECTIVE IMPAIRMENTS: Abnormal gait, decreased balance, decreased mobility, difficulty walking, decreased ROM, decreased strength, hypomobility, increased edema, increased fascial restrictions, increased muscle spasms, and pain.   ACTIVITY LIMITATIONS: carrying, lifting, sitting, standing, squatting, sleeping, stairs, transfers, and locomotion level  PARTICIPATION LIMITATIONS: meal prep, cleaning, laundry, driving, shopping, community activity, and yard work  PERSONAL FACTORS: 3+ comorbidities: GERD, OA, PNA, basal cell CA, previous THA bilat and R RCR are also affecting patient's functional outcome.   REHAB POTENTIAL: Good  CLINICAL DECISION MAKING: Stable/uncomplicated  EVALUATION COMPLEXITY: Low   GOALS: Goals reviewed with patient? Yes  LONG TERM GOALS: Target date: 11/26/2023  Independent with final HEP Goal status: INITIAL  2.  PSFS score improved by 3 points Goal status: INITIAL  3.  Rt knee AROM improved to 0-130 for improved function and mobility Goal status: INIITAL  4.  Report pain < 2/10 with walking outdoors for improved function Goal status: INITIAL  5.  Demonstrate ability to stand without UE support at least 3 reps for improved functional strength Goal status:  INITIAL    PLAN:  PT FREQUENCY: 1-2x/week  PT DURATION: 4 weeks  PLANNED INTERVENTIONS: 97164- PT Re-evaluation, 97750- Physical Performance Testing, 97110-Therapeutic exercises, 97530- Therapeutic activity, [id number]- Neuromuscular re-education, 97535- Self Care, 16109- Manual therapy, [id number]- Gait training, [phone]- Electrical stimulation (unattended), 97016- Vasopneumatic device, Patient/Family education, Balance training, Stair training, Taping, Dry Needling, Joint mobilization, Cryotherapy, and Moist heat.  PLAN FOR NEXT SESSION: Review HEP PRN, work on balance and strength progressions   NEXT MD VISIT: 11/19/23   Marley Simmers, PT, DPT 11/05/23 1:56 PM

## 2023-11-11 DIAGNOSIS — L089 Local infection of the skin and subcutaneous tissue, unspecified: Secondary | ICD-10-CM | POA: Diagnosis not present

## 2023-11-11 DIAGNOSIS — N3001 Acute cystitis with hematuria: Secondary | ICD-10-CM | POA: Diagnosis not present

## 2023-11-11 DIAGNOSIS — R399 Unspecified symptoms and signs involving the genitourinary system: Secondary | ICD-10-CM | POA: Diagnosis not present

## 2023-11-12 ENCOUNTER — Ambulatory Visit (INDEPENDENT_AMBULATORY_CARE_PROVIDER_SITE_OTHER): Admitting: Physical Therapy

## 2023-11-12 ENCOUNTER — Encounter: Payer: Self-pay | Admitting: Physical Therapy

## 2023-11-12 DIAGNOSIS — R600 Localized edema: Secondary | ICD-10-CM

## 2023-11-12 DIAGNOSIS — M6281 Muscle weakness (generalized): Secondary | ICD-10-CM

## 2023-11-12 DIAGNOSIS — R2689 Other abnormalities of gait and mobility: Secondary | ICD-10-CM | POA: Diagnosis not present

## 2023-11-12 DIAGNOSIS — M25661 Stiffness of right knee, not elsewhere classified: Secondary | ICD-10-CM

## 2023-11-12 DIAGNOSIS — M25561 Pain in right knee: Secondary | ICD-10-CM

## 2023-11-12 DIAGNOSIS — R2681 Unsteadiness on feet: Secondary | ICD-10-CM | POA: Diagnosis not present

## 2023-11-12 NOTE — Therapy (Signed)
 OUTPATIENT PHYSICAL THERAPY TREATMENT   Patient Name: Beverly [name] MRN: [id number] DOB:[birth year], 78 y.o., female Today's Date: 11/12/2023  END OF SESSION:  PT End of Session - 11/12/23 1437     Visit Number 3    Number of Visits 8    Date for PT Re-Evaluation 11/26/23    Authorization Type Medicare/Aetna    PT Start Time 1433    PT Stop Time 1525    PT Time Calculation (min) 52 min    Activity Tolerance Patient tolerated treatment well    Behavior During Therapy [facility] for tasks assessed/performed               Past Medical History:  Diagnosis Date   Atypical mole 11/03/2013   R prox post lat thigh    Basal cell carcinoma    mid back   Basal cell carcinoma 04/01/2019   L chest parasternal    Basal cell carcinoma 09/11/2017   L mid back at braline 3.0 cm lat to spine   Emphysema lung (HCC)    Mild no symptoms Found on coronary CT scan   Esophageal dysmotility    GERD (gastroesophageal reflux disease)    occassional   History of hiatal hernia    OA (osteoarthritis)    hips and knees   Pneumonia    history of    PONV (postoperative nausea and vomiting)    Squamous cell carcinoma of skin 06/11/2021   L mid pretibia, [facility]   Past Surgical History:  Procedure Laterality Date   COLONOSCOPY     FRACTURE SURGERY  2009   left ankle   INGUINAL HERNIA REPAIR Right 08/22/2021   Procedure: OPEN RIGHT INGUINAL HERNIA REPAIR WITH MESH;  Surgeon: Adalberto Acton, MD;  Location: [facility];  Service: General;  Laterality: Right;   LAPAROSCOPY  1992   infection after tubal   PATELLA-FEMORAL ARTHROPLASTY Right 10/07/2023   Procedure: RIGHT KNEE PATELLOFEMORAL ARTHROPLASTY;  Surgeon: Jasmine Mesi, MD;  Location: [facility] OR;  Service: Orthopedics;  Laterality: Right;   SHOULDER OPEN ROTATOR CUFF REPAIR Right 2009   TOTAL HIP ARTHROPLASTY Left 01/29/2019   Procedure: LEFT TOTAL HIP ARTHROPLASTY ANTERIOR APPROACH;  Surgeon: Arnie Lao, MD;  Location: [facility];  Service:  Orthopedics;  Laterality: Left;   TOTAL HIP ARTHROPLASTY Right 09/24/2019   Procedure: RIGHT TOTAL HIP ARTHROPLASTY ANTERIOR APPROACH;  Surgeon: Arnie Lao, MD;  Location: [facility];  Service: Orthopedics;  Laterality: Right;   TUBAL LIGATION  1992   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   S/P right unicompartmental knee replacement 10/07/2023   Restless leg 06/11/2023   Dizziness 03/25/2023   Gait abnormality 03/25/2023   Chronic insomnia 03/25/2023   Abnormal CT of the chest 02/08/2021   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   Primary osteoarthritis of left hip 12/07/2018   Primary osteoarthritis of right hip 12/07/2018   Pain of right hip joint 05/21/2017   Trochanteric bursitis, right hip 04/23/2017   Pain in right hip 04/23/2017   Hearing loss of right ear due to cerumen impaction 10/25/2016   Chronic rhinitis 07/04/2015   Cough 07/04/2015   Endometrial polyp 01/11/2011    PCP: Rae Bugler, MD  REFERRING PROVIDER: Jasmine Mesi, MD  REFERRING DIAG: [phone] (ICD-10-CM) - S/P knee surgery  Rationale for Evaluation and Treatment: Rehabilitation  THERAPY DIAG:  Acute  pain of right knee  Stiffness of right knee, not elsewhere classified  Muscle weakness (generalized)  Unsteadiness on feet  Other abnormalities of gait and mobility  Localized edema  ONSET DATE: DOS: 10/07/23   SUBJECTIVE:                                                                                                                                                                                           SUBJECTIVE STATEMENT: She has been doing exercises which seems to help.   PERTINENT HISTORY:  Right Patellofemoral Arthroplasty 10/07/2023,  PMH positive for GERD, OA, PNA, basal cell CA, previous THA bilat and R RCR  PAIN:  Are you having pain? Yes:  NPRS scale:  at rest 0/10, in last week up to 2/10 Pain location: Rt knee Pain description: aching, tightness Aggravating factors: worse at night, Relieving factors: ibuprofen, tylenol  QD  PRECAUTIONS:  None  RED FLAGS: None   WEIGHT BEARING RESTRICTIONS:  No  FALLS:  Has patient fallen in last 6 months? No  LIVING ENVIRONMENT: Lives with: lives with their spouse Lives in: House/apartment Stairs: Yes: External: 2 steps; none Has following equipment at home: Otho Blitz - 2 wheeled  OCCUPATION:  Retired - Furniture conservator/restorer for practices  PLOF:  Independent and Leisure: hiking, golfing, mountains, visit grandchildren  PATIENT GOALS:  Museum/gallery exhibitions officer, climb mountains   OBJECTIVE:  Note: Objective measures were completed at Evaluation unless otherwise noted.   PATIENT SURVEYS:  Patient-Specific Activity Scoring Scheme  0 represents "unable to perform." 10 represents "able to perform at prior level. 0 1 2 3 4 5 6 7 8 9  10 (Date and Score)   Activity Eval     1. Hiking 0    2. Golfing 0    3. Driving 0   Score 0    Total score = sum of the activity scores/number of activities Minimum detectable change (90%CI) for average score = 2 points Minimum detectable change (90%CI) for single activity score = 3 points    COGNITIVE STATUS: Within functional limits for tasks assessed   SENSATION: WFL  POSTURE:  No Significant postural limitations   GAIT: 10/29/23 Comments: amb without AD; mild antalgic gait on Rt  EDEMA: 10/29/23 Rt knee: 37 cm/Lt knee: 33.7 cm   LOWER EXTREMITY ROM:     ROM Right eval Left eval  Knee flexion A: 119 P: 123 A: 136  Knee extension A: -2 (seated LAQ) P: 0 A: 0 (seated LAQ)   (Blank rows = not tested)   LOWER EXTREMITY MMT:    10/29/23: Not formally tested at eval; grossly 3-/5 Rt knee strength  MMT  Right eval Left eval  Hip flexion    Hip extension    Hip abduction    Hip adduction    Hip internal rotation    Hip  external rotation    Knee flexion    Knee extension    Ankle dorsiflexion    Ankle plantarflexion    Ankle inversion    Ankle eversion     (Blank rows = not tested)     FUNCTIONAL TESTS:  10/29/23  5 times sit to stand: 15.22 with UE support SLS: LLE: 8 sec; RLE: 8 sec    TREATMENT:                                                                                                                              DATE:  11/12/23  Therapeutic Exercise Recumbent bike L3 x 8 min; seat 6   as warm up while PT took subjective data.  Therapeutic Activities: Step up  & down using RLE 4 step no UE support 10 reps Standing alternating LEs for stance & AROM with balance hip abduction L3 band 2x10 bil Standing alternating LEs for stance & AROM with balance hip extension L3 band 2x10 bil Standing L3 band RLE TKE + SLS 15 reps Leg press bil 68# 2x15; RLE only 31# x 10   Neuromuscular Re-education Tandem stance LLE in front & in back 30 secs, 1st set on floor eyes open; 2nd set on floor eyes closed frequent touch //bars, 3rd set on foam eyes open with intermittent touch; PT demo and verbal cues on how to use sink in chair back for safe environment to practice at home.  Patient verbalized understanding.  Self-Care: PT recommended and educated pt on elevation with LE higher than heart >/= 15 min >/= 2x/day with 1 min 4 sets of ankle motion. Pt verbalized understanding.  Vaso medium compression RLE knee 34* 10 min with elevation and intermittent ankle motions     TREATMENT:                                                                                                                              DATE:  11/05/23 TherEx Recumbent bike L3 x 6 min; seat 6 Incline board calf stretch 3x30 sec Seated LAQ 3# on Rt 2x15 Seated SLR 3# on Rt 2x10  TherAct Standing hip abduction L3 band 2x10 bil Standing hip extension L3 band 2x10  bil Leg press bil 62# 2x15; RLE only 25# x10   Neuro Re-Ed Corner  balance activities on foam: Feet apart with horizontal/vertical head turns x 10 each Feet together with horizontal/vertical head turns x 10 each Feet together with EC 3x15 sec  10/29/23 TherEx See HEP - demonstrated with trial reps performed PRN, min cues for comprehension    Self Care Educated on clinical findings and PT POC     PATIENT EDUCATION:  Education details: HEP Person educated: Patient Education method: Programmer, multimedia, Facilities manager, and Handouts Education comprehension: verbalized understanding, returned demonstration, and needs further education  HOME EXERCISE PROGRAM: Access Code: [email or web address] URL: [email or web address] Date: 11/05/2023 Prepared by: Casimer Clear  Exercises - Sit to Stand  - 2-3 x daily - 7 x weekly - 1 sets - 10 reps - Supine Heel Slide with Strap  - 2-3 x daily - 7 x weekly - 1 sets - 10 reps - Supine Bridge  - 2-3 x daily - 7 x weekly - 1 sets - 10 reps - 5 sec hold - Seated Straight Leg Raise  - 2-3 x daily - 7 x weekly - 1 sets - 10 reps - 3-5 sec hold - Standing Single Leg Stance with Counter Support  - 2-3 x daily - 7 x weekly - 1 sets - 5 reps - 10-15 sec hold - Standing Hip Abduction with Resistance at Ankles and Counter Support  - 1 x daily - 7 x weekly - 2 sets - 10 reps - Standing Hip Extension with Resistance at Ankles and Counter Support  - 1 x daily - 7 x weekly - 2 sets - 10 reps - Romberg Stance with Head Nods on Foam Pad  - 1 x daily - 7 x weekly - 1 sets - 1 reps - 10 rotations each way - Romberg Stance Eyes Closed on Foam Pad  - 1 x daily - 7 x weekly - 1 sets - 5 reps - 10-15 sec hold   ASSESSMENT:  CLINICAL IMPRESSION: Patient tolerated slight increase in weight for leg press.  PT performed more standing activities engaging left knee which she tolerated well.  Will continue to benefit from PT to maximize function.   OBJECTIVE IMPAIRMENTS: Abnormal gait, decreased balance, decreased mobility, difficulty  walking, decreased ROM, decreased strength, hypomobility, increased edema, increased fascial restrictions, increased muscle spasms, and pain.   ACTIVITY LIMITATIONS: carrying, lifting, sitting, standing, squatting, sleeping, stairs, transfers, and locomotion level  PARTICIPATION LIMITATIONS: meal prep, cleaning, laundry, driving, shopping, community activity, and yard work  PERSONAL FACTORS: 3+ comorbidities: GERD, OA, PNA, basal cell CA, previous THA bilat and R RCR are also affecting patient's functional outcome.   REHAB POTENTIAL: Good  CLINICAL DECISION MAKING: Stable/uncomplicated  EVALUATION COMPLEXITY: Low   GOALS: Goals reviewed with patient? Yes  LONG TERM GOALS: Target date: 11/26/2023  Independent with final HEP Goal status: Ongoing  11/12/2023  2.  PSFS score improved by 3 points Goal status: Ongoing  11/12/2023  3.  Rt knee AROM improved to 0-130 for improved function and mobility Goal status: Ongoing  11/12/2023  4.  Report pain < 2/10 with walking outdoors for improved function Goal status: Ongoing  11/12/2023  5.  Demonstrate ability to stand without UE support at least 3 reps for improved functional strength Goal status: Ongoing  11/12/2023    PLAN:  PT FREQUENCY: 1-2x/week  PT DURATION: 4 weeks  PLANNED INTERVENTIONS: 97164- PT Re-evaluation, 97750- Physical Performance Testing, 97110-Therapeutic exercises, 97530- Therapeutic activity, [id number]- Neuromuscular  re-education, [phone]- Self Care, 29528- Manual therapy, [phone]- Gait training, [phone]- Electrical stimulation (unattended), [phone]- Vasopneumatic device, Patient/Family education, Balance training, Stair training, Taping, Dry Needling, Joint mobilization, Cryotherapy, and Moist heat.  PLAN FOR NEXT SESSION: Update HEP PRN, work on balance and functional strength progressions. Patient has MD appt next week.   NEXT MD VISIT: 11/19/23    Lorie Rook, PT, DPT 11/12/2023, 4:37 PM

## 2023-11-13 ENCOUNTER — Ambulatory Visit: Payer: Medicare Other | Admitting: Dermatology

## 2023-11-13 ENCOUNTER — Encounter: Payer: Self-pay | Admitting: Dermatology

## 2023-11-13 DIAGNOSIS — L820 Inflamed seborrheic keratosis: Secondary | ICD-10-CM

## 2023-11-13 DIAGNOSIS — W908XXA Exposure to other nonionizing radiation, initial encounter: Secondary | ICD-10-CM | POA: Diagnosis not present

## 2023-11-13 DIAGNOSIS — Z86018 Personal history of other benign neoplasm: Secondary | ICD-10-CM | POA: Diagnosis not present

## 2023-11-13 DIAGNOSIS — L578 Other skin changes due to chronic exposure to nonionizing radiation: Secondary | ICD-10-CM | POA: Diagnosis not present

## 2023-11-13 DIAGNOSIS — Z79899 Other long term (current) drug therapy: Secondary | ICD-10-CM

## 2023-11-13 DIAGNOSIS — D1801 Hemangioma of skin and subcutaneous tissue: Secondary | ICD-10-CM

## 2023-11-13 DIAGNOSIS — Z85828 Personal history of other malignant neoplasm of skin: Secondary | ICD-10-CM

## 2023-11-13 DIAGNOSIS — L603 Nail dystrophy: Secondary | ICD-10-CM

## 2023-11-13 DIAGNOSIS — L821 Other seborrheic keratosis: Secondary | ICD-10-CM | POA: Diagnosis not present

## 2023-11-13 DIAGNOSIS — Z872 Personal history of diseases of the skin and subcutaneous tissue: Secondary | ICD-10-CM | POA: Diagnosis not present

## 2023-11-13 DIAGNOSIS — M67442 Ganglion, left hand: Secondary | ICD-10-CM | POA: Diagnosis not present

## 2023-11-13 DIAGNOSIS — Z1283 Encounter for screening for malignant neoplasm of skin: Secondary | ICD-10-CM

## 2023-11-13 DIAGNOSIS — D229 Melanocytic nevi, unspecified: Secondary | ICD-10-CM

## 2023-11-13 DIAGNOSIS — Z8589 Personal history of malignant neoplasm of other organs and systems: Secondary | ICD-10-CM

## 2023-11-13 DIAGNOSIS — L814 Other melanin hyperpigmentation: Secondary | ICD-10-CM

## 2023-11-13 DIAGNOSIS — Z7189 Other specified counseling: Secondary | ICD-10-CM

## 2023-11-13 DIAGNOSIS — M67449 Ganglion, unspecified hand: Secondary | ICD-10-CM

## 2023-11-13 MED ORDER — MUPIROCIN 2 % EX OINT: 1.0000 | TOPICAL_OINTMENT | Freq: Every day | CUTANEOUS | 11 refills | Status: AC

## 2023-11-13 NOTE — Progress Notes (Signed)
 Follow-Up Visit   Subjective  Beverly [name] is a 78 y.o. female who presents for the following: Skin Cancer Screening and Full Body Skin Exam Hx of aks, isks, hx of bcc Would like these areas checked Spot at scalp that is dry and scaly, spot at left index finger for 2 months   The patient presents for Total-Body Skin Exam (TBSE) for skin cancer screening and mole check. The patient has spots, moles and lesions to be evaluated, some may be new or changing and the patient may have concern these could be cancer.  The following portions of the chart were reviewed this encounter and updated as appropriate: medications, allergies, medical history  Review of Systems:  No other skin or systemic complaints except as noted in HPI or Assessment and Plan.  Objective  Well appearing patient in no apparent distress; mood and affect are within normal limits.  A full examination was performed including scalp, head, eyes, ears, nose, lips, neck, chest, axillae, abdomen, back, buttocks, bilateral upper extremities, bilateral lower extremities, hands, feet, fingers, toes, fingernails, and toenails. All findings within normal limits unless otherwise noted below.   Relevant physical exam findings are noted in the Assessment and Plan.  right scalp x 1, back x 2, chest x 1 (4) Erythematous stuck-on, waxy papule or plaque              Assessment & Plan   SKIN CANCER SCREENING PERFORMED TODAY.  ACTINIC DAMAGE - Chronic condition, secondary to cumulative UV/sun exposure - diffuse scaly erythematous macules with underlying dyspigmentation - Recommend daily broad spectrum sunscreen SPF 30+ to sun-exposed areas, reapply every 2 hours as needed.  - Staying in the shade or wearing long sleeves, sun glasses (UVA+UVB protection) and wide brim hats (4-inch brim around the entire circumference of the hat) are also recommended for sun protection.  - Call for new or changing lesions.  LENTIGINES,  SEBORRHEIC KERATOSES, HEMANGIOMAS - Benign normal skin lesions - Benign-appearing - Call for any changes  MELANOCYTIC NEVI - Tan-brown and/or pink-flesh-colored symmetric macules and papules - Benign appearing on exam today - Observation - Call clinic for new or changing moles - Recommend daily use of broad spectrum spf 30+ sunscreen to sun-exposed areas.   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 Herniation at joint  Photos today  A digital mucous  cyst also known as a myxoid cyst or pseudocyst is a ganglion cyst arising from the distal interphalangeal (DIP) joint of the finger or thumb (or, less commonly, toe). The cysts are believed to form from degeneration of connective tissue and are associated with osteoarthritic joints or injury. Although the exact  etiology is unknown, it is likely that a small tear forms in a joint capsule or tendon sheath, allowing extravasation of synovial fluid into the adjacent tissue. When the fluid reacts with local tissue, it becomes more gelatinous and a cyst wall forms. With any treatment, there is a high rate of recurrence.   Treatment options include: - Puncture / Incision & Drainage (I&D) - Intralesional steroid injection - Intralesional Sclerosant injection (Asclera/ Polidocanol) - Intralesional steroid + sclerosant - Corticosteroid tape - Cryosurgery - Laser (CO2) - Infrared photocoagulation - Excision / Surgery  Treatment Plan: If bothersome recommend orthopedic surgeon if would like further treatment recommend letting area heal completely  Recommend using mupirocin ointment to affected area daily and cover with bandage Can continue soaking daily  Given a years refill of mupirocin   INFLAMED SEBORRHEIC KERATOSIS (4) right scalp x 1, back x 2, chest x 1 (4) Symptomatic, irritating, patient would like treated. Destruction of lesion - right scalp x 1, back x 2, chest x 1 (4) Complexity: simple   Destruction method: cryotherapy   Informed consent: discussed and consent obtained   Timeout:  patient name, date of birth, surgical site, and procedure verified Lesion destroyed using liquid nitrogen: Yes   Region frozen until ice ball extended beyond lesion: Yes   Outcome: patient tolerated procedure well with no complications   Post-procedure details: wound care instructions given   Return for 1 year tbse .  IRandee Busing, CMA, am acting as scribe for Celine Collard, MD.   Documentation: I have reviewed the above documentation for accuracy and completeness, and I agree with the above.  Celine Collard, MD

## 2023-11-13 NOTE — Patient Instructions (Addendum)
 A digital mucous cyst also known as a myxoid cyst or pseudocyst is a ganglion cyst arising from the distal interphalangeal (DIP) joint of the finger or thumb (or, less commonly, toe). The cysts are believed to form from degeneration of connective tissue and are associated with osteoarthritic joints or injury. Although the exact etiology is unknown, it is likely that a small tear forms in a joint capsule or tendon sheath, allowing extravasation of synovial fluid into the adjacent tissue. When the fluid reacts with local tissue, it becomes more gelatinous and a cyst wall forms. With any treatment, there is a high rate of recurrence.   Treatment options include: - Puncture / Incision & Drainage (I&D) - Intralesional steroid injection - Intralesional Sclerosant injection (Asclera/ Polidocanol) - Intralesional steroid + sclerosant - Corticosteroid tape - Cryosurgery - Laser (CO2) - Infrared photocoagulation - Excision / Surgery Recommend continue soaking affected finger daily until resolved Recommend apply mupirocin ointment daily and covering with bandage until healed  Seborrheic Keratosis  What causes seborrheic keratoses? Seborrheic keratoses are harmless, common skin growths that first appear during adult life.  As time goes by, more growths appear.  Some people may develop a large number of them.  Seborrheic keratoses appear on both covered and uncovered body parts.  They are not caused by sunlight.  The tendency to develop seborrheic keratoses can be inherited.  They vary in color from skin-colored to gray, brown, or even black.  They can be either smooth or have a rough, warty surface.   Seborrheic keratoses are superficial and look as if they were stuck on the skin.  Under the microscope this type of keratosis looks like layers upon layers of skin.  That is why at times the top layer may seem to fall off, but the rest of the growth remains and re-grows.    Treatment Seborrheic keratoses do  not need to be treated, but can easily be removed in the office.  Seborrheic keratoses often cause symptoms when they rub on clothing or jewelry.  Lesions can be in the way of shaving.  If they become inflamed, they can cause itching, soreness, or burning.  Removal of a seborrheic keratosis can be accomplished by freezing, burning, or surgery. If any spot bleeds, scabs, or grows rapidly, please return to have it checked, as these can be an indication of a skin cancer.  Cryotherapy Aftercare  Wash gently with soap and water  everyday.   Apply Vaseline and Band-Aid daily until healed.     Melanoma ABCDEs  Melanoma is the most dangerous type of skin cancer, and is the leading cause of death from skin disease.  You are more likely to develop melanoma if you: Have light-colored skin, light-colored eyes, or red or blond hair Spend a lot of time in the sun Tan regularly, either outdoors or in a tanning bed Have had blistering sunburns, especially during childhood Have a close family member who has had a melanoma Have atypical moles or large birthmarks  Early detection of melanoma is key since treatment is typically straightforward and cure rates are extremely high if we catch it early.   The first sign of melanoma is often a change in a mole or a new dark spot.  The ABCDE system is a way of remembering the signs of melanoma.  A for asymmetry:  The two halves do not match. B for border:  The edges of the growth are irregular. C for color:  A mixture of colors are present  instead of an even brown color. D for diameter:  Melanomas are usually (but not always) greater than 6mm - the size of a pencil eraser. E for evolution:  The spot keeps changing in size, shape, and color.  Please check your skin once per month between visits. You can use a small mirror in front and a large mirror behind you to keep an eye on the back side or your body.   If you see any new or changing lesions before your next  follow-up, please call to schedule a visit.  Please continue daily skin protection including broad spectrum sunscreen SPF 30+ to sun-exposed areas, reapplying every 2 hours as needed when you're outdoors.   Staying in the shade or wearing long sleeves, sun glasses (UVA+UVB protection) and wide brim hats (4-inch brim around the entire circumference of the hat) are also recommended for sun protection.    Due to recent changes in healthcare laws, you may see results of your pathology and/or laboratory studies on MyChart before the doctors have had a chance to review them. We understand that in some cases there may be results that are confusing or concerning to you. Please understand that not all results are received at the same time and often the doctors may need to interpret multiple results in order to provide you with the best plan of care or course of treatment. Therefore, we ask that you please give us  2 business days to thoroughly review all your results before contacting the office for clarification. Should we see a critical lab result, you will be contacted sooner.   If You Need Anything After Your Visit  If you have any questions or concerns for your doctor, please call our main line at [phone] and press option 4 to reach your doctor's medical assistant. If no one answers, please leave a voicemail as directed and we will return your call as soon as possible. Messages left after 4 pm will be answered the following business day.   You may also send us  a message via MyChart. We typically respond to MyChart messages within 1-2 business days.  For prescription refills, please ask your pharmacy to contact our office. Our fax number is [phone].  If you have an urgent issue when the clinic is closed that cannot wait until the next business day, you can page your doctor at the number below.    Please note that while we do our best to be available for urgent issues outside of office hours,  we are not available 24/7.   If you have an urgent issue and are unable to reach us , you may choose to seek medical care at your doctor's office, retail clinic, urgent care center, or emergency room.  If you have a medical emergency, please immediately call 911 or go to the emergency department.  Pager Numbers  - Dr. Bary Likes: [phone]  - Dr. Annette Barters: [phone]  - Dr. Felipe Horton: [phone]   In the event of inclement weather, please call our main line at [phone] for an update on the status of any delays or closures.  Dermatology Medication Tips: Please keep the boxes that topical medications come in in order to help keep track of the instructions about where and how to use these. Pharmacies typically print the medication instructions only on the boxes and not directly on the medication tubes.   If your medication is too expensive, please contact our office at [phone] option 4 or send us  a message through MyChart.  We are unable to tell what your co-pay for medications will be in advance as this is different depending on your insurance coverage. However, we may be able to find a substitute medication at lower cost or fill out paperwork to get insurance to cover a needed medication.   If a prior authorization is required to get your medication covered by your insurance company, please allow us  1-2 business days to complete this process.  Drug prices often vary depending on where the prescription is filled and some pharmacies may offer cheaper prices.  The website www.goodrx.com contains coupons for medications through different pharmacies. The prices here do not account for what the cost may be with help from insurance (it may be cheaper with your insurance), but the website can give you the price if you did not use any insurance.  - You can print the associated coupon and take it with your prescription to the pharmacy.  - You may also stop by our office during regular  business hours and pick up a GoodRx coupon card.  - If you need your prescription sent electronically to a different pharmacy, notify our office through [facility] or by phone at [phone] option 4.     Si Usted Necesita Algo Despus de Su Visita  Tambin puede enviarnos un mensaje a travs de Clinical cytogeneticist. Por lo general respondemos a los mensajes de MyChart en el transcurso de 1 a 2 das hbiles.  Para renovar recetas, por favor pida a su farmacia que se ponga en contacto con nuestra oficina. Franz Jacks de fax es [place] [phone].  Si tiene un asunto urgente cuando la clnica est cerrada y que no puede esperar hasta el siguiente da hbil, puede llamar/localizar a su doctor(a) al nmero que aparece a continuacin.   Por favor, tenga en cuenta que aunque hacemos todo lo posible para estar disponibles para asuntos urgentes fuera del horario de [place], no estamos disponibles las 24 horas del da, los 7 [address]  [address] de la [place].   Si tiene un problema urgente y no puede comunicarse con nosotros, puede optar por buscar atencin mdica  en el consultorio de su doctor(a), en una clnica privada, en un centro de atencin urgente o en una sala de emergencias.  Si tiene Engineer, drilling, por favor llame inmediatamente al 911 o vaya a la sala de emergencias.  Nmeros de bper  - Dr. Bary Likes: [phone]  - Dra. Annette Barters: [phone]  - Dr. Felipe Horton: [phone]   En caso de inclemencias del tiempo, por favor llame a Lajuan Pila principal al [phone] para una actualizacin sobre el [place] de cualquier retraso o cierre.  Consejos para la medicacin en dermatologa: Por favor, guarde las cajas en las que vienen los medicamentos de uso tpico para ayudarle a seguir las instrucciones sobre dnde y cmo usarlos. Las farmacias generalmente imprimen las instrucciones del medicamento slo en las cajas y no directamente en los tubos del [place].   Si su medicamento es muy caro, por  favor, pngase en contacto con Bettyjane Brunet llamando al [phone] y presione la opcin 4 o envenos un mensaje a travs de Clinical cytogeneticist.   No podemos decirle cul ser su copago por los medicamentos por adelantado ya que esto es diferente dependiendo de la cobertura de su seguro. Sin embargo, es posible que podamos encontrar un medicamento sustituto a Audiological scientist un formulario para que el seguro cubra el medicamento que se considera necesario.   Si se requiere una autorizacin previa para que  su compaa de seguros [place] su medicamento, por favor permtanos de 1 a 2 das hbiles para completar [address]  Los precios de los medicamentos varan con frecuencia dependiendo del Environmental consultant de dnde se surte la receta y alguna farmacias pueden ofrecer precios ms baratos.  El sitio web www.goodrx.com tiene cupones para medicamentos de Health and safety inspector. Los precios aqu no tienen en cuenta lo que podra costar con la ayuda del seguro (puede ser ms barato con su seguro), pero el sitio web puede darle el precio si no utiliz Tourist information centre manager.  - Puede imprimir el cupn correspondiente y llevarlo con su receta a la farmacia.  - Tambin puede pasar por nuestra oficina durante el horario de atencin regular y Education officer, museum una tarjeta de cupones de GoodRx.  - Si necesita que su receta se enve electrnicamente a una farmacia diferente, informe a nuestra oficina a travs de MyChart de [place] o por telfono llamando al [phone] y presione la opcin 4.

## 2023-11-17 ENCOUNTER — Encounter: Payer: Self-pay | Admitting: Physical Therapy

## 2023-11-17 ENCOUNTER — Ambulatory Visit (INDEPENDENT_AMBULATORY_CARE_PROVIDER_SITE_OTHER): Admitting: Physical Therapy

## 2023-11-17 DIAGNOSIS — R600 Localized edema: Secondary | ICD-10-CM | POA: Diagnosis not present

## 2023-11-17 DIAGNOSIS — M25661 Stiffness of right knee, not elsewhere classified: Secondary | ICD-10-CM

## 2023-11-17 DIAGNOSIS — R2681 Unsteadiness on feet: Secondary | ICD-10-CM

## 2023-11-17 DIAGNOSIS — R2689 Other abnormalities of gait and mobility: Secondary | ICD-10-CM

## 2023-11-17 DIAGNOSIS — M6281 Muscle weakness (generalized): Secondary | ICD-10-CM | POA: Diagnosis not present

## 2023-11-17 DIAGNOSIS — M25561 Pain in right knee: Secondary | ICD-10-CM

## 2023-11-17 NOTE — Therapy (Signed)
 OUTPATIENT PHYSICAL THERAPY TREATMENT   Patient Name: Beverly [name] MRN: [id number] DOB:[birth year], 78 y.o., female Today's Date: 11/17/2023  END OF SESSION:  PT End of Session - 11/17/23 1144     Visit Number 4    Number of Visits 8    Date for PT Re-Evaluation 11/26/23    Authorization Type Medicare/[employer]    PT Start Time 1145    PT Stop Time 1225    PT Time Calculation (min) 40 min    Activity Tolerance Patient tolerated treatment well    Behavior During Therapy [facility] for tasks assessed/performed             Past Medical History:  Diagnosis Date   Atypical mole 11/03/2013   R prox post lat thigh    Basal cell carcinoma    mid back   Basal cell carcinoma 04/01/2019   L chest parasternal    Basal cell carcinoma 09/11/2017   L mid back at braline 3.0 cm lat to spine   Emphysema lung (HCC)    Mild no symptoms Found on coronary CT scan   Esophageal dysmotility    GERD (gastroesophageal reflux disease)    occassional   History of hiatal hernia    OA (osteoarthritis)    hips and knees   Pneumonia    history of    PONV (postoperative nausea and vomiting)    Squamous cell carcinoma of skin 06/11/2021   L mid pretibia, [facility]   Past Surgical History:  Procedure Laterality Date   COLONOSCOPY     FRACTURE SURGERY  2009   left ankle   INGUINAL HERNIA REPAIR Right 08/22/2021   Procedure: OPEN RIGHT INGUINAL HERNIA REPAIR WITH MESH;  Surgeon: Adalberto Acton, MD;  Location: [facility];  Service: General;  Laterality: Right;   LAPAROSCOPY  1992   infection after tubal   PATELLA-FEMORAL ARTHROPLASTY Right 10/07/2023   Procedure: RIGHT KNEE PATELLOFEMORAL ARTHROPLASTY;  Surgeon: Jasmine Mesi, MD;  Location: [facility];  Service: Orthopedics;  Laterality: Right;   SHOULDER OPEN ROTATOR CUFF REPAIR Right 2009   TOTAL HIP ARTHROPLASTY Left 01/29/2019   Procedure: LEFT TOTAL HIP ARTHROPLASTY ANTERIOR APPROACH;  Surgeon: Arnie Lao, MD;  Location: [facility];  Service:  Orthopedics;  Laterality: Left;   TOTAL HIP ARTHROPLASTY Right 09/24/2019   Procedure: RIGHT TOTAL HIP ARTHROPLASTY ANTERIOR APPROACH;  Surgeon: Arnie Lao, MD;  Location: [facility];  Service: Orthopedics;  Laterality: Right;   TUBAL LIGATION  1992   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   S/P right unicompartmental knee replacement 10/07/2023   Restless leg 06/11/2023   Dizziness 03/25/2023   Gait abnormality 03/25/2023   Chronic insomnia 03/25/2023   Abnormal CT of the chest 02/08/2021   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   Primary osteoarthritis of left hip 12/07/2018   Primary osteoarthritis of right hip 12/07/2018   Pain of right hip joint 05/21/2017   Trochanteric bursitis, right hip 04/23/2017   Pain in right hip 04/23/2017   Hearing loss of right ear due to cerumen impaction 10/25/2016   Chronic rhinitis 07/04/2015   Cough 07/04/2015   Endometrial polyp 01/11/2011    PCP: Rae Bugler, MD  REFERRING PROVIDER: Jasmine Mesi, MD  REFERRING DIAG: [phone] (ICD-10-CM) - S/P knee surgery  Rationale for Evaluation and Treatment: Rehabilitation  THERAPY DIAG:  Acute pain of  right knee  Stiffness of right knee, not elsewhere classified  Muscle weakness (generalized)  Unsteadiness on feet  Other abnormalities of gait and mobility  Localized edema  ONSET DATE: DOS: 10/07/23   SUBJECTIVE:                                                                                                                                                                                           SUBJECTIVE STATEMENT: Has a new onset of sudden sharp pains near her tibial plateau that's nearly caused her to fall once; also feels like the balance and dizziness is back to where she was before working with neuro rehab.  PERTINENT HISTORY:  Right  Patellofemoral Arthroplasty 10/07/2023,  PMH positive for GERD, OA, PNA, basal cell CA, previous THA bilat and R RCR  PAIN:  Are you having pain? Yes: NPRS scale:  at rest 1/10, up to 3/10 with sharp pains Pain location: Rt knee Pain description: aching, tightness Aggravating factors: worse at night, Relieving factors: ibuprofen, tylenol  QD  PRECAUTIONS:  None  RED FLAGS: None   WEIGHT BEARING RESTRICTIONS:  No  FALLS:  Has patient fallen in last 6 months? No  LIVING ENVIRONMENT: Lives with: lives with their spouse Lives in: House/apartment Stairs: Yes: External: 2 steps; none Has following equipment at home: Otho Blitz - 2 wheeled  OCCUPATION:  Retired - Furniture conservator/restorer for practices  PLOF:  Independent and Leisure: hiking, golfing, mountains, visit grandchildren  PATIENT GOALS:  Museum/gallery exhibitions officer, climb mountains   OBJECTIVE:  Note: Objective measures were completed at Evaluation unless otherwise noted.   PATIENT SURVEYS:  Patient-Specific Activity Scoring Scheme  0 represents "unable to perform." 10 represents "able to perform at prior level. 0 1 2 3 4 5 6 7 8 9  10 (Date and Score)   Activity Eval     1. Hiking 0    2. Golfing 0    3. Driving 0   Score 0    Total score = sum of the activity scores/number of activities Minimum detectable change (90%CI) for average score = 2 points Minimum detectable change (90%CI) for single activity score = 3 points    COGNITIVE STATUS: Within functional limits for tasks assessed   SENSATION: WFL  POSTURE:  No Significant postural limitations   GAIT: 10/29/23 Comments: amb without AD; mild antalgic gait on Rt  EDEMA: 10/29/23 Rt knee: 37 cm/Lt knee: 33.7 cm   LOWER EXTREMITY ROM:     ROM Right eval Left eval  Knee flexion A: 119 P: 123 A: 136  Knee extension A: -2 (seated LAQ) P: 0 A: 0 (seated LAQ)   (Blank  rows = not tested)   LOWER EXTREMITY MMT:    10/29/23: Not formally tested at eval; grossly  3-/5 Rt knee strength  MMT Right eval Left eval  Hip flexion    Hip extension    Hip abduction    Hip adduction    Hip internal rotation    Hip external rotation    Knee flexion    Knee extension    Ankle dorsiflexion    Ankle plantarflexion    Ankle inversion    Ankle eversion     (Blank rows = not tested)     FUNCTIONAL TESTS:  10/29/23  5 times sit to stand: 15.22 with UE support SLS: LLE: 8 sec; RLE: 8 sec    TREATMENT:                                                                                                                              DATE:  11/17/23 Therapeutic Exercise Recumbent bike L3 x 8 min; seat 6  BATCA knee extension 5# 2x10; RLE focus with A from Lt PRN  Neuro Re Ed Rockerboard ant/post and laterally: weight shifting then horizontal/vertical head turns with frequent intermittent UE support needed; min A with tactile cues for proprioception  Therapeutic Activities: Leg press bil 68# 2x15; then RLE only 25# 2x15   11/12/23  Therapeutic Exercise Recumbent bike L3 x 8 min; seat 6   as warm up while PT took subjective data.  Therapeutic Activities: Step up  & down using RLE 4 step no UE support 10 reps Standing alternating LEs for stance & AROM with balance hip abduction L3 band 2x10 bil Standing alternating LEs for stance & AROM with balance hip extension L3 band 2x10 bil Standing L3 band RLE TKE + SLS 15 reps Leg press bil 68# 2x15; RLE only 31# x 10   Neuromuscular Re-education Tandem stance LLE in front & in back 30 secs, 1st set on floor eyes open; 2nd set on floor eyes closed frequent touch //bars, 3rd set on foam eyes open with intermittent touch; PT demo and verbal cues on how to use sink in chair back for safe environment to practice at home.  Patient verbalized understanding.  Self-Care: PT recommended and educated pt on elevation with LE higher than heart >/= 15 min >/= 2x/day with 1 min 4 sets of ankle motion. Pt verbalized  understanding.  Vaso medium compression RLE knee 34* 10 min with elevation and intermittent ankle motions     TREATMENT:  DATE:  11/05/23 TherEx Recumbent bike L3 x 6 min; seat 6 Incline board calf stretch 3x30 sec Seated LAQ 3# on Rt 2x15 Seated SLR 3# on Rt 2x10  TherAct Standing hip abduction L3 band 2x10 bil Standing hip extension L3 band 2x10 bil Leg press bil 62# 2x15; RLE only 25# x10   Neuro Re-Ed Corner balance activities on foam: Feet apart with horizontal/vertical head turns x 10 each Feet together with horizontal/vertical head turns x 10 each Feet together with EC 3x15 sec  10/29/23 TherEx See HEP - demonstrated with trial reps performed PRN, min cues for comprehension    Self Care Educated on clinical findings and PT POC     PATIENT EDUCATION:  Education details: HEP Person educated: Patient Education method: Programmer, multimedia, Facilities manager, and Handouts Education comprehension: verbalized understanding, returned demonstration, and needs further education  HOME EXERCISE PROGRAM: Access Code: [email or web address] URL: [email or web address] Date: 11/05/2023 Prepared by: Casimer Clear  Exercises - Sit to Stand  - 2-3 x daily - 7 x weekly - 1 sets - 10 reps - Supine Heel Slide with Strap  - 2-3 x daily - 7 x weekly - 1 sets - 10 reps - Supine Bridge  - 2-3 x daily - 7 x weekly - 1 sets - 10 reps - 5 sec hold - Seated Straight Leg Raise  - 2-3 x daily - 7 x weekly - 1 sets - 10 reps - 3-5 sec hold - Standing Single Leg Stance with Counter Support  - 2-3 x daily - 7 x weekly - 1 sets - 5 reps - 10-15 sec hold - Standing Hip Abduction with Resistance at Ankles and Counter Support  - 1 x daily - 7 x weekly - 2 sets - 10 reps - Standing Hip Extension with Resistance at Ankles and Counter Support  - 1 x daily - 7 x weekly - 2 sets - 10  reps - Romberg Stance with Head Nods on Foam Pad  - 1 x daily - 7 x weekly - 1 sets - 1 reps - 10 rotations each way - Romberg Stance Eyes Closed on Foam Pad  - 1 x daily - 7 x weekly - 1 sets - 5 reps - 10-15 sec hold   ASSESSMENT:  CLINICAL IMPRESSION: Pt continues to do well with PT despite recent setback of pain which she will mention to the MD later this week.  Anticipate we are nearing d/c from PT at this time.    OBJECTIVE IMPAIRMENTS: Abnormal gait, decreased balance, decreased mobility, difficulty walking, decreased ROM, decreased strength, hypomobility, increased edema, increased fascial restrictions, increased muscle spasms, and pain.   ACTIVITY LIMITATIONS: carrying, lifting, sitting, standing, squatting, sleeping, stairs, transfers, and locomotion level  PARTICIPATION LIMITATIONS: meal prep, cleaning, laundry, driving, shopping, community activity, and yard work  PERSONAL FACTORS: 3+ comorbidities: GERD, OA, PNA, basal cell CA, previous THA bilat and R RCR are also affecting patient's functional outcome.   REHAB POTENTIAL: Good  CLINICAL DECISION MAKING: Stable/uncomplicated  EVALUATION COMPLEXITY: Low   GOALS: Goals reviewed with patient? Yes  LONG TERM GOALS: Target date: 11/26/2023  Independent with final HEP Goal status: Ongoing  11/12/2023  2.  PSFS score improved by 3 points Goal status: Ongoing  11/12/2023  3.  Rt knee AROM improved to 0-130 for improved function and mobility Goal status: Ongoing  11/12/2023  4.  Report pain < 2/10 with walking outdoors for improved function Goal status: Ongoing  11/12/2023  5.  Demonstrate ability to stand  without UE support at least 3 reps for improved functional strength Goal status: Ongoing  11/12/2023    PLAN:  PT FREQUENCY: 1-2x/week  PT DURATION: 4 weeks  PLANNED INTERVENTIONS: 97164- PT Re-evaluation, 97750- Physical Performance Testing, 97110-Therapeutic exercises, 97530- Therapeutic activity, [id number]-  Neuromuscular re-education, 97535- Self Care, 86578- Manual therapy, [phone]- Gait training, [phone]- Electrical stimulation (unattended), 97016- Vasopneumatic device, Patient/Family education, Balance training, Stair training, Taping, Dry Needling, Joint mobilization, Cryotherapy, and Moist heat.  PLAN FOR NEXT SESSION: plan for d/c and transition to home/community fitness; Update HEP PRN, work on balance and functional strength progressions. Patient has MD appt next week.   NEXT MD VISIT: 11/19/23    Marley Simmers, PT, DPT 11/17/2023, 12:27 PM

## 2023-11-19 ENCOUNTER — Encounter: Payer: Self-pay | Admitting: Physical Therapy

## 2023-11-19 ENCOUNTER — Ambulatory Visit (INDEPENDENT_AMBULATORY_CARE_PROVIDER_SITE_OTHER): Admitting: Orthopedic Surgery

## 2023-11-19 ENCOUNTER — Telehealth: Payer: Self-pay

## 2023-11-19 ENCOUNTER — Ambulatory Visit (INDEPENDENT_AMBULATORY_CARE_PROVIDER_SITE_OTHER): Admitting: Physical Therapy

## 2023-11-19 DIAGNOSIS — R2689 Other abnormalities of gait and mobility: Secondary | ICD-10-CM

## 2023-11-19 DIAGNOSIS — M6281 Muscle weakness (generalized): Secondary | ICD-10-CM | POA: Diagnosis not present

## 2023-11-19 DIAGNOSIS — R2681 Unsteadiness on feet: Secondary | ICD-10-CM | POA: Diagnosis not present

## 2023-11-19 DIAGNOSIS — R600 Localized edema: Secondary | ICD-10-CM

## 2023-11-19 DIAGNOSIS — M25661 Stiffness of right knee, not elsewhere classified: Secondary | ICD-10-CM | POA: Diagnosis not present

## 2023-11-19 DIAGNOSIS — Z9889 Other specified postprocedural states: Secondary | ICD-10-CM

## 2023-11-19 DIAGNOSIS — M25561 Pain in right knee: Secondary | ICD-10-CM

## 2023-11-19 NOTE — Telephone Encounter (Signed)
 Will review with Dr. Merlinda Starling

## 2023-11-19 NOTE — Telephone Encounter (Signed)
 Per Dr Merlinda Starling can see tomorrow

## 2023-11-19 NOTE — Telephone Encounter (Signed)
 Will you please get with Dr Merlinda Starling on how quickly he can see patient for possible left finger infection? She has been on oral abx x 1.5wk

## 2023-11-19 NOTE — Telephone Encounter (Signed)
 Is it getting worse and which finger and where on the finger?

## 2023-11-19 NOTE — Telephone Encounter (Signed)
 Lvm for pt to cb to schedule with Dr. Merlinda Starling tomorrow am

## 2023-11-19 NOTE — Telephone Encounter (Signed)
 Talked to pt and scheduled her

## 2023-11-19 NOTE — Therapy (Addendum)
 OUTPATIENT PHYSICAL THERAPY TREATMENT DISCHARGE SUMMARY    Patient Name: Beverly [name] MRN: [id number] DOB:02/19/1946, 78 y.o., female Today's Date: 11/19/2023  END OF SESSION:  PT End of Session - 11/19/23 1429     Visit Number 5    Number of Visits 8    Date for PT Re-Evaluation 11/26/23    Authorization Type Medicare/[employer]na    PT Start Time 1425    PT Stop Time 1455    PT Time Calculation (min) 30 min    Activity Tolerance Patient tolerated treatment well    Behavior During Therapy [facility] for tasks assessed/performed              Past Medical History:  Diagnosis Date   Atypical mole 11/03/2013   R prox post lat thigh    Basal cell carcinoma    mid back   Basal cell carcinoma 04/01/2019   L chest parasternal    Basal cell carcinoma 09/11/2017   L mid back at braline 3.0 cm lat to spine   Emphysema lung (HCC)    Mild no symptoms Found on coronary CT scan   Esophageal dysmotility    GERD (gastroesophageal reflux disease)    occassional   History of hiatal hernia    OA (osteoarthritis)    hips and knees   Pneumonia    history of    PONV (postoperative nausea and vomiting)    Squamous cell carcinoma of skin 06/11/2021   L mid pretibia, [facility]   Past Surgical History:  Procedure Laterality Date   COLONOSCOPY     FRACTURE SURGERY  2009   left ankle   INGUINAL HERNIA REPAIR Right 08/22/2021   Procedure: OPEN RIGHT INGUINAL HERNIA REPAIR WITH MESH;  Surgeon: Signe Mitzie LABOR, MD;  Location: [facility];  Service: General;  Laterality: Right;   LAPAROSCOPY  1992   infection after tubal   PATELLA-FEMORAL ARTHROPLASTY Right 10/07/2023   Procedure: RIGHT KNEE PATELLOFEMORAL ARTHROPLASTY;  Surgeon: Addie Cordella Hamilton, MD;  Location: [facility] OR;  Service: Orthopedics;  Laterality: Right;   SHOULDER OPEN ROTATOR CUFF REPAIR Right 2009   TOTAL HIP ARTHROPLASTY Left 01/29/2019   Procedure: LEFT TOTAL HIP ARTHROPLASTY ANTERIOR APPROACH;  Surgeon: Vernetta Lonni GRADE, MD;  Location:  [facility];  Service: Orthopedics;  Laterality: Left;   TOTAL HIP ARTHROPLASTY Right 09/24/2019   Procedure: RIGHT TOTAL HIP ARTHROPLASTY ANTERIOR APPROACH;  Surgeon: Vernetta Lonni GRADE, MD;  Location: [facility];  Service: Orthopedics;  Laterality: Right;   TUBAL LIGATION  1992   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   S/P right unicompartmental knee replacement 10/07/2023   Restless leg 06/11/2023   Dizziness 03/25/2023   Gait abnormality 03/25/2023   Chronic insomnia 03/25/2023   Abnormal CT of the chest 02/08/2021   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   Primary osteoarthritis of left hip 12/07/2018   Primary osteoarthritis of right hip 12/07/2018   Pain of right hip joint 05/21/2017   Trochanteric bursitis, right hip 04/23/2017   Pain in right hip 04/23/2017   Hearing loss of right ear due to cerumen impaction 10/25/2016   Chronic rhinitis 07/04/2015   Cough 07/04/2015   Endometrial polyp 01/11/2011    PCP: Seabron Lenis, MD  REFERRING PROVIDER: Addie Cordella Hamilton, MD  REFERRING DIAG: [phone] (ICD-10-CM) - S/P knee surgery  Rationale for Evaluation and Treatment: Rehabilitation  THERAPY DIAG:  Acute pain of right knee  Stiffness of right knee, not elsewhere classified  Muscle weakness (generalized)  Unsteadiness on feet  Other abnormalities of gait and mobility  Localized edema  ONSET DATE: DOS: 10/07/23   SUBJECTIVE:                                                                                                                                                                                           SUBJECTIVE STATEMENT: Feels like she has everything she needs to d/c today.  Dizziness is slightly better but still having symptoms.  PERTINENT HISTORY:  Right Patellofemoral Arthroplasty 10/07/2023,  PMH positive for GERD, OA, PNA, basal  cell CA, previous THA bilat and R RCR  PAIN:  Are you having pain? Yes: NPRS scale:  1-2/10 Pain location: Rt knee Pain description: aching, tightness Aggravating factors: worse at night, Relieving factors: ibuprofen, tylenol  QD  PRECAUTIONS:  None  RED FLAGS: None   WEIGHT BEARING RESTRICTIONS:  No  FALLS:  Has patient fallen in last 6 months? No  LIVING ENVIRONMENT: Lives with: lives with their spouse Lives in: House/apartment Stairs: Yes: External: 2 steps; none Has following equipment at home: Vannie - 2 wheeled  OCCUPATION:  Retired - Furniture conservator/restorer for practices  PLOF:  Independent and Leisure: hiking, golfing, mountains, visit grandchildren  PATIENT GOALS:  Museum/gallery exhibitions officer, climb mountains   OBJECTIVE:  Note: Objective measures were completed at Evaluation unless otherwise noted.   PATIENT SURVEYS:  Patient-Specific Activity Scoring Scheme  0 represents "unable to perform." 10 represents "able to perform at prior level. 0 1 2 3 4 5 6 7 8 9  10 (Date and Score)   Activity Eval  11/19/23   1. Hiking 0 5   2. Golfing 0 10   3. Driving 0 10  Score 0    Total score = sum of the activity scores/number of activities Minimum detectable change (90%CI) for average score = 2 points Minimum detectable change (90%CI) for single activity score = 3 points    COGNITIVE STATUS: Within functional limits for tasks assessed   SENSATION: WFL  POSTURE:  No Significant postural limitations   GAIT: 10/29/23 Comments: amb without AD; mild antalgic gait on Rt  EDEMA: 10/29/23 Rt knee: 37 cm/Lt knee: 33.7 cm   LOWER EXTREMITY ROM:     ROM Right eval Left eval Right 11/19/23  Knee flexion A: 119 P: 123 A: 136 A: 125  Knee extension A: -2 (seated LAQ) P: 0 A: 0 (seated LAQ) A: 0   (Blank rows = not tested)   LOWER EXTREMITY MMT:    10/29/23: Not formally tested at  eval; grossly 3-/5 Rt knee strength  MMT Right eval Left eval  Hip flexion     Hip extension    Hip abduction    Hip adduction    Hip internal rotation    Hip external rotation    Knee flexion    Knee extension    Ankle dorsiflexion    Ankle plantarflexion    Ankle inversion    Ankle eversion     (Blank rows = not tested)     FUNCTIONAL TESTS:  10/29/23  5 times sit to stand: 15.22 with UE support SLS: LLE: 8 sec; RLE: 8 sec   11/19/23 Unable to stand from chair without UE support   TREATMENT:                                                                                                                              DATE:  11/19/23 Therapeutic Exercise Recumbent bike L4 x 8 min; seat 6  Discussed HEP and transition to global strengthening program - showed app based options for her as well if she still needs some guidance ROM measurements - see above  TherAct Golf swings at 50-75% intensity without difficulty or increase in pain; pt reports no issues or concerns with golf swing at this time. Trial of sit to/from stand from standard chair height; pt still unable to perform but able with 2 foam pad- purchased one from [employer] for vestibular exercises as well   11/17/23 Therapeutic Exercise Recumbent bike L3 x 8 min; seat 6  BATCA knee extension 5# 2x10; RLE focus with A from Lt PRN  Neuro Re Ed Rockerboard ant/post and laterally: weight shifting then horizontal/vertical head turns with frequent intermittent UE support needed; min A with tactile cues for proprioception  Therapeutic Activities: Leg press bil 68# 2x15; then RLE only 25# 2x15   11/12/23  Therapeutic Exercise Recumbent bike L3 x 8 min; seat 6   as warm up while PT took subjective data.  Therapeutic Activities: Step up  & down using RLE 4 step no UE support 10 reps Standing alternating LEs for stance & AROM with balance hip abduction L3 band 2x10 bil Standing alternating LEs for stance & AROM with balance hip extension L3 band 2x10 bil Standing L3 band RLE TKE + SLS 15 reps Leg  press bil 68# 2x15; RLE only 31# x 10   Neuromuscular Re-education Tandem stance LLE in front & in back 30 secs, 1st set on floor eyes open; 2nd set on floor eyes closed frequent touch //bars, 3rd set on foam eyes open with intermittent touch; PT demo and verbal cues on how to use sink in chair back for safe environment to practice at home.  Patient verbalized understanding.  Self-Care: PT recommended and educated pt on elevation with LE higher than heart >/= 15 min >/= 2x/day with 1 min 4 sets of ankle motion. Pt verbalized understanding.  Vaso medium compression RLE knee 34* 10 min  with elevation and intermittent ankle motions   PATIENT EDUCATION:  Education details: HEP Person educated: Patient Education method: Explanation, Demonstration, and Handouts Education comprehension: verbalized understanding, returned demonstration, and needs further education  HOME EXERCISE PROGRAM: Access Code: [email or web address] URL: [email or web address] Date: 11/05/2023 Prepared by: Corean Ku  Exercises - Sit to Stand  - 2-3 x daily - 7 x weekly - 1 sets - 10 reps - Supine Heel Slide with Strap  - 2-3 x daily - 7 x weekly - 1 sets - 10 reps - Supine Bridge  - 2-3 x daily - 7 x weekly - 1 sets - 10 reps - 5 sec hold - Seated Straight Leg Raise  - 2-3 x daily - 7 x weekly - 1 sets - 10 reps - 3-5 sec hold - Standing Single Leg Stance with Counter Support  - 2-3 x daily - 7 x weekly - 1 sets - 5 reps - 10-15 sec hold - Standing Hip Abduction with Resistance at Ankles and Counter Support  - 1 x daily - 7 x weekly - 2 sets - 10 reps - Standing Hip Extension with Resistance at Ankles and Counter Support  - 1 x daily - 7 x weekly - 2 sets - 10 reps - Romberg Stance with Head Nods on Foam Pad  - 1 x daily - 7 x weekly - 1 sets - 1 reps - 10 rotations each way - Romberg Stance Eyes Closed on Foam Pad  - 1 x daily - 7 x weekly - 1 sets - 5 reps - 10-15 sec hold   ASSESSMENT:  CLINICAL  IMPRESSION: Pt is pleased with current progress and has demonstrated great progress with PT.  Anticipate she is ready for d/c but will see MD this afternoon for final determination.      OBJECTIVE IMPAIRMENTS: Abnormal gait, decreased balance, decreased mobility, difficulty walking, decreased ROM, decreased strength, hypomobility, increased edema, increased fascial restrictions, increased muscle spasms, and pain.   ACTIVITY LIMITATIONS: carrying, lifting, sitting, standing, squatting, sleeping, stairs, transfers, and locomotion level  PARTICIPATION LIMITATIONS: meal prep, cleaning, laundry, driving, shopping, community activity, and yard work  PERSONAL FACTORS: 3+ comorbidities: GERD, OA, PNA, basal cell CA, previous THA bilat and R RCR are also affecting patient's functional outcome.   REHAB POTENTIAL: Good  CLINICAL DECISION MAKING: Stable/uncomplicated  EVALUATION COMPLEXITY: Low   GOALS: Goals reviewed with patient? Yes  LONG TERM GOALS: Target date: 11/26/2023  Independent with final HEP Goal status: MET 11/19/23  2.  PSFS score improved by 3 points Goal status: MET 11/19/23  3.  Rt knee AROM improved to 0-130 for improved function and mobility Goal status: Ongoing  11/19/2023  4.  Report pain < 2/10 with walking outdoors for improved function Goal status: MET 11/19/23  5.  Demonstrate ability to stand without UE support at least 3 reps for improved functional strength Goal status: Ongoing  11/19/2023    PLAN:  PT FREQUENCY: 1-2x/week  PT DURATION: 4 weeks  PLANNED INTERVENTIONS: 97164- PT Re-evaluation, 97750- Physical Performance Testing, 97110-Therapeutic exercises, 97530- Therapeutic activity, [id number]- Neuromuscular re-education, 97535- Self Care, [id number]- Manual therapy, [phone]- Gait training, [phone]- Electrical stimulation (unattended), 97016- Vasopneumatic device, Patient/Family education, Balance training, Stair training, Taping, Dry Needling, Joint mobilization,  Cryotherapy, and Moist heat.  PLAN FOR NEXT SESSION: likely d/c if MD in agreement   NEXT MD VISIT: 11/19/23    Corean JULIANNA Ku, PT, DPT 11/19/2023, 3:04 PM     PHYSICAL THERAPY DISCHARGE SUMMARY  Visits  from Start of Care: 5  Current functional level related to goals / functional outcomes: See above   Remaining deficits: See above   Education / Equipment: HEP   Patient agrees to discharge. Patient goals were met. Patient is being discharged due to meeting the stated rehab goals.

## 2023-11-20 ENCOUNTER — Ambulatory Visit: Admitting: Orthopedic Surgery

## 2023-11-20 DIAGNOSIS — L03012 Cellulitis of left finger: Secondary | ICD-10-CM | POA: Diagnosis not present

## 2023-11-20 NOTE — Progress Notes (Signed)
 Beverly [name] - 78 y.o. female MRN [id number]  Date of birth: 1945/09/16  Office Visit Note: Visit Date: 11/20/2023 PCP: Rae Bugler, MD Referred by: Rae Bugler, MD  Subjective: No chief complaint on file.  HPI: Beverly [name] is a pleasant 78 y.o. female who presents today for specific hand surgical evaluation as a referral from one of my partners Dr. Rozelle Corning for an ongoing left index finger paronychia.  She states that the symptoms have been present for multiple months, has been doing topical antibiotic ointment has been recommended for iodine  soaks as well.  States that her symptoms have improved, she has not had any drainage, minimal pain currently.  Pertinent ROS were reviewed with the patient and found to be negative unless otherwise specified above in HPI.   Visit Reason:left index finger paronychia Duration of symptoms: 2 months Hand dominance: right Occupation: retired Diabetic: No Smoking: No Heart/Lung History: Bronchiectasis  Blood Thinners: none  Prior Testing/EMGnone Injections (Date):none Treatments: Bactroban  ointment and keflex  for bladder infection also tried iodine  soak Prior Surgery: none- just had knee sx  Assessment & Plan: Visit Diagnoses:  1. Paronychia of finger of left hand     Plan: At this juncture, do not see any need for incision and drainage at the left index finger.  I do see evidence of a prior paronychia with some notable nail plate deformity, however there is no significant tenderness in this region or erythema on examination today.  Given that she has not had any recent drainage, I do not see any indication for exploration at this time.  It appears she has responded well to the conservative management with topical ointment and soaks.    I did explain to her that should she have increasing erythema or pain or associated drainage in the near future, she can return to me for further consideration of index finger I&D and possible nail plate removal  at that time.  Explained that I can perform this procedure under local anesthesia in the office setting as needed.  As a precaution, I recommended that she return to me in approximately 3 to 4 weeks for a wound check to ensure ongoing improvement.  She expressed understanding.  Follow-up: No follow-ups on file.   Meds & Orders: No orders of the defined types were placed in this encounter.  No orders of the defined types were placed in this encounter.    Procedures: No procedures performed      Clinical History: No specialty comments available.  She reports that she quit smoking about 43 years ago. Her smoking use included cigarettes. She has never used smokeless tobacco. No results for input(s): HGBA1C, LABURIC in the last 8760 hours.  Objective:   Vital Signs: There were no vitals taken for this visit.  Physical Exam  Gen: Well-appearing, in no acute distress; non-toxic CV: Regular Rate. Well-perfused. Warm.  Resp: Breathing unlabored on room air; no wheezing. Psych: Fluid speech in conversation; appropriate affect; normal thought process  Ortho Exam Left index finger - No significant tenderness or erythema, notable nail plate deformity along the ulnar border, nail plate remains well secured beneath the eponychial fold, no expressible drainage, range of motion is intact, sensation intact, normal color and capillary refill  Imaging: No results found.  Past Medical/Family/Surgical/Social History: Medications & Allergies reviewed per EMR, new medications updated. 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    Primary osteoarthritis of left hip 12/07/2018   Primary osteoarthritis of right hip 12/07/2018   Pain of right hip joint 05/21/2017   Trochanteric bursitis, right hip 04/23/2017   Pain in right hip 04/23/2017   Hearing loss of right ear due to cerumen impaction 10/25/2016   Chronic rhinitis 07/04/2015   Cough 07/04/2015   Endometrial polyp 01/11/2011   Past Medical History:  Diagnosis Date   Atypical mole 11/03/2013   R prox post lat thigh    Basal cell carcinoma    mid back   Basal cell carcinoma 04/01/2019   L chest parasternal    Basal cell carcinoma 09/11/2017   L mid back at braline 3.0 cm lat to spine   Emphysema lung (HCC)    Mild no symptoms Found on coronary CT scan   Esophageal dysmotility    GERD (gastroesophageal reflux disease)    occassional   History of hiatal hernia    OA (osteoarthritis)    hips and knees   Pneumonia    history of    PONV (postoperative nausea and vomiting)    Squamous cell carcinoma of skin 06/11/2021   L mid pretibia, [facility]   Family History  Problem Relation Age of Onset   Allergic rhinitis Neg Hx    Angioedema Neg Hx    Asthma Neg Hx    Atopy Neg Hx    Eczema Neg Hx    Immunodeficiency Neg Hx    Urticaria Neg Hx    Past Surgical History:  Procedure Laterality Date   COLONOSCOPY     FRACTURE SURGERY  2009   left ankle   INGUINAL HERNIA REPAIR Right 08/22/2021   Procedure: OPEN RIGHT INGUINAL HERNIA REPAIR WITH MESH;  Surgeon: Adalberto Acton, MD;  Location: [facility];  Service: General;  Laterality: Right;   LAPAROSCOPY  1992   infection after tubal   PATELLA-FEMORAL ARTHROPLASTY Right 10/07/2023   Procedure: RIGHT KNEE PATELLOFEMORAL ARTHROPLASTY;  Surgeon: Jasmine Mesi, MD;  Location: [facility];  Service: Orthopedics;  Laterality: Right;   SHOULDER OPEN ROTATOR CUFF REPAIR Right 2009   TOTAL HIP ARTHROPLASTY Left 01/29/2019   Procedure: LEFT TOTAL HIP ARTHROPLASTY ANTERIOR APPROACH;  Surgeon: Arnie Lao, MD;   Location: [facility];  Service: Orthopedics;  Laterality: Left;   TOTAL HIP ARTHROPLASTY Right 09/24/2019   Procedure: RIGHT TOTAL HIP ARTHROPLASTY ANTERIOR APPROACH;  Surgeon: Arnie Lao, MD;  Location: [facility];  Service: Orthopedics;  Laterality: Right;   TUBAL LIGATION  1992   Social History   Occupational History   Not on file  Tobacco Use   Smoking status: Former    Current packs/day: 0.00    Types: Cigarettes    Quit date: 12/27/1979    Years since quitting: 43.9   Smokeless tobacco: Never  Vaping Use   Vaping status: Never Used  Substance and Sexual Activity   Alcohol use: Yes    Alcohol/week: 1.0 standard drink of alcohol    Types: 1 Glasses of wine per week    Comment: 1-2 a week   Drug use: No   Sexual activity: Not Currently    Birth control/protection: Post-menopausal    Yaiza Palazzola Merlinda Starling) Marce Sensing, M.D. [facility],  Hand Surgery

## 2023-11-22 ENCOUNTER — Encounter: Payer: Self-pay | Admitting: Orthopedic Surgery

## 2023-11-22 NOTE — Progress Notes (Signed)
 Post-Op Visit Note   Patient: Beverly [name]           Date of Birth: Dec 04, 1945           MRN: [id number] Visit Date: 11/19/2023 PCP: Seabron Lenis, MD   Assessment & Plan:  Chief Complaint:  Chief Complaint  Patient presents with   Right Knee - Follow-up    right patellofemoral arthroplasty on 10/07/2023   Visit Diagnoses:  1. S/P knee surgery     Plan: Patient is now 6 weeks out right patellofemoral arthroplasty.  She is doing well.  Making good progress.  Still working on getting up and out of the chair.  She is swinging a golf club.  No problem with stairs.  On exam she has range of motion 5-1 25.  She is good on her home exercise program.  She is going to go start working out at [employer] park.  Having some balance and dizziness issues which started last fall.  She also thinks she may have a left finger infection.  Plan at this time is to send her to our hand surgeon for evaluation for any type of intervention may be required for hyponychial.  The knee is doing well.  Have her continue to work on range of motion exercises on her own and she will follow-up with us  as needed. Follow-Up Instructions: No follow-ups on file.   Orders:  No orders of the defined types were placed in this encounter.  No orders of the defined types were placed in this encounter.   Imaging: No results found.  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   Primary osteoarthritis of left hip 12/07/2018   Primary osteoarthritis of right hip 12/07/2018   Pain of right hip joint 05/21/2017   Trochanteric bursitis, right  hip 04/23/2017   Pain in right hip 04/23/2017   Hearing loss of right ear due to cerumen impaction 10/25/2016   Chronic rhinitis 07/04/2015   Cough 07/04/2015   Endometrial polyp 01/11/2011   Past Medical History:  Diagnosis Date   Atypical mole 11/03/2013   R prox post lat thigh    Basal cell carcinoma    mid back   Basal cell carcinoma 04/01/2019   L chest parasternal    Basal cell carcinoma 09/11/2017   L mid back at braline 3.0 cm lat to spine   Emphysema lung (HCC)    Mild no symptoms Found on coronary CT scan   Esophageal dysmotility    GERD (gastroesophageal reflux disease)    occassional   History of hiatal hernia    OA (osteoarthritis)    hips and knees   Pneumonia    history of    PONV (postoperative nausea and vomiting)    Squamous cell carcinoma of skin 06/11/2021   L mid pretibia, [facility]    Family History  Problem Relation Age of Onset   Allergic rhinitis Neg Hx    Angioedema Neg Hx    Asthma Neg Hx    Atopy Neg Hx    Eczema Neg Hx    Immunodeficiency Neg Hx  Urticaria Neg Hx     Past Surgical History:  Procedure Laterality Date   COLONOSCOPY     FRACTURE SURGERY  2009   left ankle   INGUINAL HERNIA REPAIR Right 08/22/2021   Procedure: OPEN RIGHT INGUINAL HERNIA REPAIR WITH MESH;  Surgeon: Signe Mitzie LABOR, MD;  Location: [facility];  Service: General;  Laterality: Right;   LAPAROSCOPY  1992   infection after tubal   PATELLA-FEMORAL ARTHROPLASTY Right 10/07/2023   Procedure: RIGHT KNEE PATELLOFEMORAL ARTHROPLASTY;  Surgeon: Addie Cordella Hamilton, MD;  Location: [facility];  Service: Orthopedics;  Laterality: Right;   SHOULDER OPEN ROTATOR CUFF REPAIR Right 2009   TOTAL HIP ARTHROPLASTY Left 01/29/2019   Procedure: LEFT TOTAL HIP ARTHROPLASTY ANTERIOR APPROACH;  Surgeon: Vernetta Lonni GRADE, MD;  Location: [facility];  Service: Orthopedics;  Laterality: Left;   TOTAL HIP ARTHROPLASTY Right 09/24/2019   Procedure: RIGHT TOTAL HIP ARTHROPLASTY ANTERIOR APPROACH;   Surgeon: Vernetta Lonni GRADE, MD;  Location: [facility];  Service: Orthopedics;  Laterality: Right;   TUBAL LIGATION  1992   Social History   Occupational History   Not on file  Tobacco Use   Smoking status: Former    Current packs/day: 0.00    Types: Cigarettes    Quit date: 12/27/1979    Years since quitting: 43.9   Smokeless tobacco: Never  Vaping Use   Vaping status: Never Used  Substance and Sexual Activity   Alcohol use: Yes    Alcohol/week: 1.0 standard drink of alcohol    Types: 1 Glasses of wine per week    Comment: 1-2 a week   Drug use: No   Sexual activity: Not Currently    Birth control/protection: Post-menopausal

## 2023-11-24 ENCOUNTER — Encounter: Admitting: Physical Therapy

## 2023-11-26 ENCOUNTER — Encounter: Admitting: Physical Therapy

## 2023-11-27 DIAGNOSIS — I2510 Atherosclerotic heart disease of native coronary artery without angina pectoris: Secondary | ICD-10-CM | POA: Diagnosis not present

## 2023-11-27 DIAGNOSIS — J439 Emphysema, unspecified: Secondary | ICD-10-CM | POA: Diagnosis not present

## 2023-11-27 DIAGNOSIS — I700 Atherosclerosis of aorta: Secondary | ICD-10-CM | POA: Diagnosis not present

## 2023-12-01 DIAGNOSIS — J439 Emphysema, unspecified: Secondary | ICD-10-CM | POA: Diagnosis not present

## 2023-12-01 DIAGNOSIS — I2510 Atherosclerotic heart disease of native coronary artery without angina pectoris: Secondary | ICD-10-CM | POA: Diagnosis not present

## 2023-12-01 DIAGNOSIS — I700 Atherosclerosis of aorta: Secondary | ICD-10-CM | POA: Diagnosis not present

## 2023-12-02 NOTE — Discharge Summary (Signed)
 Physician Discharge Summary      Patient ID: Beverly [name] MRN: [id number] DOB/AGE: 1945/10/06 78 y.o.  Admit date: 10/07/2023 Discharge date: 10/08/2023  Admission Diagnoses:  Principal Problem:   S/P right unicompartmental knee replacement Active Problems:   Patella, chondromalacia, right   Discharge Diagnoses:  Same  Surgeries: Procedure(s): RIGHT KNEE PATELLOFEMORAL ARTHROPLASTY on 10/07/2023   Consultants:   Discharged Condition: Stable  Hospital Course: Beverly [name] is an 78 y.o. female who was admitted 10/07/2023 with a chief complaint of right knee pain, and found to have a diagnosis of right knee patellofemoral arthritis.  They were brought to the operating room on 10/07/2023 and underwent the above named procedures.  Pt awoke from anesthesia without complication and was transferred to the floor. On POD1, patient's pain was overall controlled and she was able to mobilize well with PT.  Discharged home on POD 1..  Pt will f/u with Dr. Addie in clinic in ~2 weeks.   Antibiotics given:  Anti-infectives (From admission, onward)    Start     Dose/Rate Route Frequency Ordered Stop   10/07/23 2315  ceFAZolin  (ANCEF ) IVPB 2g/100 mL premix        2 g 200 mL/hr over 30 Minutes Intravenous Every 8 hours 10/07/23 2224 10/08/23 1559   10/07/23 1304  vancomycin  (VANCOCIN ) powder  Status:  Discontinued          As needed 10/07/23 1304 10/07/23 1500   10/07/23 0915  ceFAZolin  (ANCEF ) IVPB 2g/100 mL premix        2 g 200 mL/hr over 30 Minutes Intravenous On call to O.R. 10/07/23 9094 10/07/23 1233     .  Recent vital signs:  Vitals:   10/08/23 0900 10/08/23 1503  BP: (!) 153/70 (!) 181/87  Pulse: 70 74  Resp: 17 17  Temp: 98.6 F (37 C) 98.4 F (36.9 C)  SpO2: 97% 99%    Recent laboratory studies:  Results for orders placed or performed in visit on 06/11/23  TSH   Collection Time: 06/11/23  3:05 PM  Result Value Ref Range   TSH 2.570 0.450 - 4.500 uIU/mL  Vitamin B12    Collection Time: 06/11/23  3:05 PM  Result Value Ref Range   Vitamin B-12 1,647 (H) 232 - 1,245 pg/mL  RPR   Collection Time: 06/11/23  3:05 PM  Result Value Ref Range   RPR Ser Ql Non Reactive Non Reactive  C-reactive protein   Collection Time: 06/11/23  3:05 PM  Result Value Ref Range   CRP 4 0 - 10 mg/L  Sedimentation rate   Collection Time: 06/11/23  3:05 PM  Result Value Ref Range   Sed Rate 15 0 - 40 mm/hr  ANA w/Reflex if Positive   Collection Time: 06/11/23  3:05 PM  Result Value Ref Range   Anti Nuclear Antibody (ANA) Negative Negative  Ferritin   Collection Time: 06/11/23  3:05 PM  Result Value Ref Range   Ferritin 30 15 - 150 ng/mL    Discharge Medications:   Allergies as of 10/08/2023       Reactions   Macrodantin Hives, Rash   Other Reaction(s): hives, Other (See Comments), Unknown   Rosuvastatin Other (See Comments)   Other Reaction(s): myalgia/arthralgia, Myalgias   Wound Dressings Itching   Sulfa Antibiotics Palpitations, Swelling, Anxiety   Other Reaction(s): heart races, itchy        Medication List     TAKE these medications    acetaminophen  325 MG tablet  Commonly known as: TYLENOL  Take 1-2 tablets (325-650 mg total) by mouth every 6 (six) hours as needed for mild pain (pain score 1-3) (or temp > 100.5).   bimatoprost  0.03 % ophthalmic solution Commonly known as: LATISSE  PLACE ONE DROP ON APPLICATOR AND APPLY EVENLY ALONG THE SKIN OF THE UPPER EYELID AT BASE OF EYELASHES ONCE DAILY AT BEDTIME TO BOTH EYES   CALCIUM 600 + D PO Take 1 tablet by mouth daily.   cyanocobalamin  1000 MCG tablet Commonly known as: VITAMIN B12 Take 1,000 mcg by mouth daily.   estradiol 0.025 MG/24HR Commonly known as: VIVELLE-DOT Place 1 patch onto the skin 2 (two) times a week. Monday  and Friday   multivitamin with minerals tablet Take 1 tablet by mouth daily.   Oil of Oregano 1500 MG Caps Take 1,500 mg by mouth daily.   OVER THE COUNTER MEDICATION Take  7-8 capsules by mouth at bedtime. MALAWI RHUBARB 400 mg/capsule   pantoprazole  40 MG tablet Commonly known as: PROTONIX  TAKE 1 TABLET BY MOUTH TWICE A DAY   pravastatin 10 MG tablet Commonly known as: PRAVACHOL Take 10 mg by mouth See admin instructions. Twice weekly - Wednesday & Friday   PROBIOTIC PO Take 1 capsule by mouth daily. 5 Billion CFUs   progesterone 100 MG capsule Commonly known as: PROMETRIUM Take 100 mg by mouth every Monday, Wednesday, and Friday.   psyllium 58.6 % powder Commonly known as: METAMUCIL Take 1 packet by mouth 2 (two) times daily.        Diagnostic Studies: No results found.  Disposition: Discharge disposition: 01-Home or Self Care       Discharge Instructions     Call MD / Call 911   Complete by: As directed    If you experience chest pain or shortness of breath, CALL 911 and be transported to the hospital emergency room.  If you develope a fever above 101 F, pus (white drainage) or increased drainage or redness at the wound, or calf pain, call your surgeon's office.   Constipation Prevention   Complete by: As directed    Drink plenty of fluids.  Prune juice may be helpful.  You may use a stool softener, such as Colace (over the counter) 100 mg twice a day.  Use MiraLax  (over the counter) for constipation as needed.   Diet - low sodium heart healthy   Complete by: As directed    Discharge instructions   Complete by: As directed    You may shower, dressing is waterproof.  Do not remove the dressing, we will remove it at your first post-op appointment.  Do not take a bath or soak the knee in a tub or pool.  You may weightbear as you can tolerate on the operative leg with a walker.  Continue using the CPM machine 3 times per day for one hour each time, increasing the degrees of range of motion daily.  Use the blue cradle boot under your heel to work on getting your leg straight.  Do NOT put a pillow under your knee.  You will follow-up with Dr.  Addie or Herlene RIGGERS in the clinic in 2 weeks at your given appointment date.    INSTRUCTIONS AFTER JOINT REPLACEMENT   Remove items at home which could result in a fall. This includes throw rugs or furniture in walking pathways ICE to the affected joint every three hours while awake for 30 minutes at a time, for at least the first 3-5 days,  and then as needed for pain and swelling.  Continue to use ice for pain and swelling. You may notice swelling that will progress down to the foot and ankle.  This is normal after surgery.  Elevate your leg when you are not up walking on it.   Continue to use the breathing machine you got in the hospital (incentive spirometer) which will help keep your temperature down.  It is common for your temperature to cycle up and down following surgery, especially at night when you are not up moving around and exerting yourself.  The breathing machine keeps your lungs expanded and your temperature down.   DIET:  As you were doing prior to hospitalization, we recommend a well-balanced diet.  DRESSING / WOUND CARE / SHOWERING  Keep the surgical dressing until follow up.  The dressing is water  proof, so you can shower without any extra covering.  IF THE DRESSING FALLS OFF or the wound gets wet inside, change the dressing with sterile gauze.  Please use good hand washing techniques before changing the dressing.  Do not use any lotions or creams on the incision until instructed by your surgeon.    ACTIVITY  Increase activity slowly as tolerated, but follow the weight bearing instructions below.   No driving for 6 weeks or until further direction given by your physician.  You cannot drive while taking narcotics.  No lifting or carrying greater than 10 lbs. until further directed by your surgeon. Avoid periods of inactivity such as sitting longer than an hour when not asleep. This helps prevent blood clots.  You may return to work once you are authorized by your doctor.      WEIGHT BEARING   Weight bearing as tolerated with assist device (walker, cane, etc) as directed, use it as long as suggested by your surgeon or therapist, typically at least 4-6 weeks.   EXERCISES  Results after joint replacement surgery are often greatly improved when you follow the exercise, range of motion and muscle strengthening exercises prescribed by your doctor. Safety measures are also important to protect the joint from further injury. Any time any of these exercises cause you to have increased pain or swelling, decrease what you are doing until you are comfortable again and then slowly increase them. If you have problems or questions, call your caregiver or physical therapist for advice.   Rehabilitation is important following a joint replacement. After just a few days of immobilization, the muscles of the leg can become weakened and shrink (atrophy).  These exercises are designed to build up the tone and strength of the thigh and leg muscles and to improve motion. Often times heat used for twenty to thirty minutes before working out will loosen up your tissues and help with improving the range of motion but do not use heat for the first two weeks following surgery (sometimes heat can increase post-operative swelling).   These exercises can be done on a training (exercise) mat, on the floor, on a table or on a bed. Use whatever works the best and is most comfortable for you.    Use music or television while you are exercising so that the exercises are a pleasant break in your day. This will make your life better with the exercises acting as a break in your routine that you can look forward to.   Perform all exercises about fifteen times, three times per day or as directed.  You should exercise both the operative leg and the  other leg as well.  Exercises include:   Quad Sets - Tighten up the muscle on the front of the thigh (Quad) and hold for 5-10 seconds.   Straight Leg Raises -  With your knee straight (if you were given a brace, keep it on), lift the leg to 60 degrees, hold for 3 seconds, and slowly lower the leg.  Perform this exercise against resistance later as your leg gets stronger.  Leg Slides: Lying on your back, slowly slide your foot toward your buttocks, bending your knee up off the floor (only go as far as is comfortable). Then slowly slide your foot back down until your leg is flat on the floor again.  Angel Wings: Lying on your back spread your legs to the side as far apart as you can without causing discomfort.  Hamstring Strength:  Lying on your back, push your heel against the floor with your leg straight by tightening up the muscles of your buttocks.  Repeat, but this time bend your knee to a comfortable angle, and push your heel against the floor.  You may put a pillow under the heel to make it more comfortable if necessary.   A rehabilitation program following joint replacement surgery can speed recovery and prevent re-injury in the future due to weakened muscles. Contact your doctor or a physical therapist for more information on knee rehabilitation.    CONSTIPATION  Constipation is defined medically as fewer than three stools per week and severe constipation as less than one stool per week.  Even if you have a regular bowel pattern at home, your normal regimen is likely to be disrupted due to multiple reasons following surgery.  Combination of anesthesia, postoperative narcotics, change in appetite and fluid intake all can affect your bowels.   YOU MUST use at least one of the following options; they are listed in order of increasing strength to get the job done.  They are all available over the counter, and you may need to use some, POSSIBLY even all of these options:    Drink plenty of fluids (prune juice may be helpful) and high fiber foods Colace 100 mg by mouth twice a day  Senokot for constipation as directed and as needed Dulcolax (bisacodyl),  take with full glass of water   Miralax  (polyethylene glycol) once or twice a day as needed.  If you have tried all these things and are unable to have a bowel movement in the first 3-4 days after surgery call either your surgeon or your primary doctor.    If you experience loose stools or diarrhea, hold the medications until you stool forms back up.  If your symptoms do not get better within 1 week or if they get worse, check with your doctor.  If you experience the worst abdominal pain ever or develop nausea or vomiting, please contact the office immediately for further recommendations for treatment.   ITCHING:  If you experience itching with your medications, try taking only a single pain pill, or even half a pain pill at a time.  You can also use Benadryl  over the counter for itching or also to help with sleep.   TED HOSE STOCKINGS:  Use stockings on both legs until for at least 2 weeks or as directed by physician office. They may be removed at night for sleeping.  MEDICATIONS:  See your medication summary on the After Visit Summary that nursing will review with you.  You may have some home medications which will  be placed on hold until you complete the course of blood thinner medication.  It is important for you to complete the blood thinner medication as prescribed.  PRECAUTIONS:  If you experience chest pain or shortness of breath - call 911 immediately for transfer to the hospital emergency department.   If you develop a fever greater that 101 F, purulent drainage from wound, increased redness or drainage from wound, foul odor from the wound/dressing, or calf pain - CONTACT YOUR SURGEON.                                                   FOLLOW-UP APPOINTMENTS:  If you do not already have a post-op appointment, please call the office for an appointment to be seen by your surgeon.  Guidelines for how soon to be seen are listed in your After Visit Summary, but are typically between 1-4  weeks after surgery.  OTHER INSTRUCTIONS:   Knee Replacement:  Do not place pillow under knee, focus on keeping the knee straight while resting. CPM instructions: 0-90 degrees, 2 hours in the morning, 2 hours in the afternoon, and 2 hours in the evening. Place foam block, curve side up under heel at all times except when in CPM or when walking.  DO NOT modify, tear, cut, or change the foam block in any way.  POST-OPERATIVE OPIOID TAPER INSTRUCTIONS: It is important to wean off of your opioid medication as soon as possible. If you do not need pain medication after your surgery it is ok to stop day one. Opioids include: Codeine, Hydrocodone(Norco, Vicodin), Oxycodone (Percocet, oxycontin ) and hydromorphone  amongst others.  Long term and even short term use of opiods can cause: Increased pain response Dependence Constipation Depression Respiratory depression And more.  Withdrawal symptoms can include Flu like symptoms Nausea, vomiting And more Techniques to manage these symptoms Hydrate well Eat regular healthy meals Stay active Use relaxation techniques(deep breathing, meditating, yoga) Do Not substitute Alcohol to help with tapering If you have been on opioids for less than two weeks and do not have pain than it is ok to stop all together.  Plan to wean off of opioids This plan should start within one week post op of your joint replacement. Maintain the same interval or time between taking each dose and first decrease the dose.  Cut the total daily intake of opioids by one tablet each day Next start to increase the time between doses. The last dose that should be eliminated is the evening dose.   MAKE SURE YOU:  Understand these instructions.  Get help right away if you are not doing well or get worse.    Thank you for letting us  be a part of your medical care team.  It is a privilege we respect greatly.  We hope these instructions will help you stay on track for a fast and full  recovery!    Dental Antibiotics:  In most cases prophylactic antibiotics for Dental procdeures after total joint surgery are not necessary.  Exceptions are as follows:  1. History of prior total joint infection  2. Severely immunocompromised (Organ Transplant, cancer chemotherapy, Rheumatoid biologic meds such as Humera)  3. Poorly controlled diabetes (A1C &gt; 8.0, blood glucose over 200)  If you have one of these conditions, contact your surgeon for an antibiotic prescription, prior to your dental procedure.  Increase activity slowly as tolerated   Complete by: As directed    Post-operative opioid taper instructions:   Complete by: As directed    POST-OPERATIVE OPIOID TAPER INSTRUCTIONS: It is important to wean off of your opioid medication as soon as possible. If you do not need pain medication after your surgery it is ok to stop day one. Opioids include: Codeine, Hydrocodone(Norco, Vicodin), Oxycodone (Percocet, oxycontin ) and hydromorphone  amongst others.  Long term and even short term use of opiods can cause: Increased pain response Dependence Constipation Depression Respiratory depression And more.  Withdrawal symptoms can include Flu like symptoms Nausea, vomiting And more Techniques to manage these symptoms Hydrate well Eat regular healthy meals Stay active Use relaxation techniques(deep breathing, meditating, yoga) Do Not substitute Alcohol to help with tapering If you have been on opioids for less than two weeks and do not have pain than it is ok to stop all together.  Plan to wean off of opioids This plan should start within one week post op of your joint replacement. Maintain the same interval or time between taking each dose and first decrease the dose.  Cut the total daily intake of opioids by one tablet each day Next start to increase the time between doses. The last dose that should be eliminated is the evening dose.           Follow-up  Information     [facility] Follow up.   Specialty: [facility] Contact information: [address] [place] [address] [place] [address] [phone]                  Signed: Herlene Calix 12/02/2023, 4:05 PM

## 2023-12-15 ENCOUNTER — Encounter: Payer: Self-pay | Admitting: Orthopedic Surgery

## 2023-12-16 ENCOUNTER — Other Ambulatory Visit: Payer: Self-pay

## 2023-12-16 MED ORDER — AMOXICILLIN 500 MG PO TABS: ORAL_TABLET | ORAL | 0 refills | Status: DC

## 2023-12-17 DIAGNOSIS — M9901 Segmental and somatic dysfunction of cervical region: Secondary | ICD-10-CM | POA: Diagnosis not present

## 2023-12-17 DIAGNOSIS — M5011 Cervical disc disorder with radiculopathy,  high cervical region: Secondary | ICD-10-CM | POA: Diagnosis not present

## 2023-12-19 DIAGNOSIS — Z124 Encounter for screening for malignant neoplasm of cervix: Secondary | ICD-10-CM | POA: Diagnosis not present

## 2023-12-19 DIAGNOSIS — R102 Pelvic and perineal pain: Secondary | ICD-10-CM | POA: Diagnosis not present

## 2023-12-19 DIAGNOSIS — Z79890 Hormone replacement therapy (postmenopausal): Secondary | ICD-10-CM | POA: Diagnosis not present

## 2023-12-19 DIAGNOSIS — Z1331 Encounter for screening for depression: Secondary | ICD-10-CM | POA: Diagnosis not present

## 2023-12-19 DIAGNOSIS — Z1231 Encounter for screening mammogram for malignant neoplasm of breast: Secondary | ICD-10-CM | POA: Diagnosis not present

## 2023-12-19 DIAGNOSIS — N952 Postmenopausal atrophic vaginitis: Secondary | ICD-10-CM | POA: Diagnosis not present

## 2023-12-22 ENCOUNTER — Ambulatory Visit (INDEPENDENT_AMBULATORY_CARE_PROVIDER_SITE_OTHER): Admitting: Orthopedic Surgery

## 2023-12-22 DIAGNOSIS — L03012 Cellulitis of left finger: Secondary | ICD-10-CM | POA: Diagnosis not present

## 2023-12-22 DIAGNOSIS — H6123 Impacted cerumen, bilateral: Secondary | ICD-10-CM | POA: Diagnosis not present

## 2023-12-22 NOTE — Progress Notes (Unsigned)
 Beverly [name] - 78 y.o. female MRN [id number]  Date of birth: Sep 12, 1945  Office Visit Note: Visit Date: 12/22/2023 PCP: Seabron Lenis, MD Referred by: Seabron Lenis, MD  Subjective: No chief complaint on file.  HPI: Beverly [name] is a pleasant 78 y.o. female who returns today for follow-up regarding left index finger paronychia.  She has been doing topical antibiotic ointment has been recommended and iodine  soaks as well.  States that her appearance of the digit have stayed relatively the same, she has not had any drainage, minimal pain currently.  Pertinent ROS were reviewed with the patient and found to be negative unless otherwise specified above in HPI.   Assessment & Plan: Visit Diagnoses:  1. Paronychia of finger of left hand      Plan: At this juncture, there remains no need for incision and drainage at the left index finger.  I do see evidence of a prior paronychia with some notable nail plate deformity, however there is no significant tenderness in this region or erythema on examination today.  Given that she has not had any recent drainage, I do not see any indication for exploration at this time.  It appears she has responded well to the conservative management with topical ointment and soaks.    At this juncture, she can stop the soaks.  I did explain that should she experience increasing erythema, pain or drainage in this region she should return to me for repeat evaluation.  She expressed full understanding.  Follow-up: No follow-ups on file.   Meds & Orders: No orders of the defined types were placed in this encounter.  No orders of the defined types were placed in this encounter.    Procedures: No procedures performed      Clinical History: No specialty comments available.  She reports that she quit smoking about 44 years ago. Her smoking use included cigarettes. She has never used smokeless tobacco. No results for input(s): HGBA1C, LABURIC in the last 8760  hours.  Objective:   Vital Signs: There were no vitals taken for this visit.  Physical Exam  Gen: Well-appearing, in no acute distress; non-toxic CV: Regular Rate. Well-perfused. Warm.  Resp: Breathing unlabored on room air; no wheezing. Psych: Fluid speech in conversation; appropriate affect; normal thought process  Ortho Exam Left index finger - No significant tenderness or erythema, notable nail plate deformity along the ulnar border, nail plate remains well secured beneath the eponychial fold, no expressible drainage, range of motion is intact, sensation intact, normal color and capillary refill  Imaging: No results found.  Past Medical/Family/Surgical/Social History: Medications & Allergies reviewed per EMR, new medications updated. 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   S/P right unicompartmental knee replacement 10/07/2023   Restless leg 06/11/2023   Dizziness 03/25/2023   Gait abnormality 03/25/2023   Chronic insomnia 03/25/2023   Abnormal CT of the chest 02/08/2021   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   Primary osteoarthritis of left hip 12/07/2018   Primary osteoarthritis of right hip 12/07/2018   Pain of right hip joint 05/21/2017   Trochanteric bursitis, right hip 04/23/2017   Pain in right hip 04/23/2017   Hearing loss of right ear due to cerumen impaction 10/25/2016   Chronic rhinitis 07/04/2015   Cough 07/04/2015   Endometrial polyp 01/11/2011   Past Medical History:  Diagnosis Date   Atypical  mole 11/03/2013   R prox post lat thigh    Basal cell carcinoma    mid back   Basal cell carcinoma 04/01/2019   L chest parasternal    Basal cell carcinoma 09/11/2017   L mid back at braline 3.0 cm lat to spine   Emphysema lung (HCC)    Mild no symptoms Found on coronary CT scan   Esophageal dysmotility    GERD (gastroesophageal  reflux disease)    occassional   History of hiatal hernia    OA (osteoarthritis)    hips and knees   Pneumonia    history of    PONV (postoperative nausea and vomiting)    Squamous cell carcinoma of skin 06/11/2021   L mid pretibia, EDC   Family History  Problem Relation Age of Onset   Allergic rhinitis Neg Hx    Angioedema Neg Hx    Asthma Neg Hx    Atopy Neg Hx    Eczema Neg Hx    Immunodeficiency Neg Hx    Urticaria Neg Hx    Past Surgical History:  Procedure Laterality Date   COLONOSCOPY     FRACTURE SURGERY  2009   left ankle   INGUINAL HERNIA REPAIR Right 08/22/2021   Procedure: OPEN RIGHT INGUINAL HERNIA REPAIR WITH MESH;  Surgeon: Signe Mitzie LABOR, MD;  Location: [facility];  Service: General;  Laterality: Right;   LAPAROSCOPY  1992   infection after tubal   PATELLA-FEMORAL ARTHROPLASTY Right 10/07/2023   Procedure: RIGHT KNEE PATELLOFEMORAL ARTHROPLASTY;  Surgeon: Addie Cordella Hamilton, MD;  Location: [facility];  Service: Orthopedics;  Laterality: Right;   SHOULDER OPEN ROTATOR CUFF REPAIR Right 2009   TOTAL HIP ARTHROPLASTY Left 01/29/2019   Procedure: LEFT TOTAL HIP ARTHROPLASTY ANTERIOR APPROACH;  Surgeon: Vernetta Lonni GRADE, MD;  Location: [facility];  Service: Orthopedics;  Laterality: Left;   TOTAL HIP ARTHROPLASTY Right 09/24/2019   Procedure: RIGHT TOTAL HIP ARTHROPLASTY ANTERIOR APPROACH;  Surgeon: Vernetta Lonni GRADE, MD;  Location: [facility];  Service: Orthopedics;  Laterality: Right;   TUBAL LIGATION  1992   Social History   Occupational History   Not on file  Tobacco Use   Smoking status: Former    Current packs/day: 0.00    Types: Cigarettes    Quit date: 12/27/1979    Years since quitting: 44.0   Smokeless tobacco: Never  Vaping Use   Vaping status: Never Used  Substance and Sexual Activity   Alcohol use: Yes    Alcohol/week: 1.0 standard drink of alcohol    Types: 1 Glasses of wine per week    Comment: 1-2 a week   Drug use: No   Sexual activity:  Not Currently    Birth control/protection: Post-menopausal    Lizvet Chunn Estela) Arlinda, M.D. [facility], Hand Surgery

## 2023-12-24 DIAGNOSIS — M9901 Segmental and somatic dysfunction of cervical region: Secondary | ICD-10-CM | POA: Diagnosis not present

## 2023-12-24 DIAGNOSIS — M5011 Cervical disc disorder with radiculopathy,  high cervical region: Secondary | ICD-10-CM | POA: Diagnosis not present

## 2023-12-25 ENCOUNTER — Telehealth: Payer: Self-pay | Admitting: Orthopedic Surgery

## 2023-12-25 NOTE — Telephone Encounter (Signed)
 Patient had right knee surgery in May and is calling to secure a date for the left knee in late October.  Please provides surgery sheet if surgery is in order.

## 2023-12-30 ENCOUNTER — Telehealth (HOSPITAL_BASED_OUTPATIENT_CLINIC_OR_DEPARTMENT_OTHER): Payer: Self-pay | Admitting: Orthopaedic Surgery

## 2023-12-30 NOTE — Telephone Encounter (Signed)
 Patient states that she was supposed to have pre op summary at the front desk. The only thing I have is for Mr Sharman that you send me an email for. Please advise

## 2024-01-01 DIAGNOSIS — J439 Emphysema, unspecified: Secondary | ICD-10-CM | POA: Diagnosis not present

## 2024-01-01 DIAGNOSIS — I700 Atherosclerosis of aorta: Secondary | ICD-10-CM | POA: Diagnosis not present

## 2024-01-01 DIAGNOSIS — I2510 Atherosclerotic heart disease of native coronary artery without angina pectoris: Secondary | ICD-10-CM | POA: Diagnosis not present

## 2024-01-07 DIAGNOSIS — M5011 Cervical disc disorder with radiculopathy,  high cervical region: Secondary | ICD-10-CM | POA: Diagnosis not present

## 2024-01-07 DIAGNOSIS — M9901 Segmental and somatic dysfunction of cervical region: Secondary | ICD-10-CM | POA: Diagnosis not present

## 2024-01-28 DIAGNOSIS — M9901 Segmental and somatic dysfunction of cervical region: Secondary | ICD-10-CM | POA: Diagnosis not present

## 2024-01-28 DIAGNOSIS — M5011 Cervical disc disorder with radiculopathy,  high cervical region: Secondary | ICD-10-CM | POA: Diagnosis not present

## 2024-01-29 DIAGNOSIS — R053 Chronic cough: Secondary | ICD-10-CM | POA: Diagnosis not present

## 2024-01-29 DIAGNOSIS — J479 Bronchiectasis, uncomplicated: Secondary | ICD-10-CM | POA: Diagnosis not present

## 2024-02-03 DIAGNOSIS — Z0000 Encounter for general adult medical examination without abnormal findings: Secondary | ICD-10-CM | POA: Diagnosis not present

## 2024-02-03 DIAGNOSIS — R42 Dizziness and giddiness: Secondary | ICD-10-CM | POA: Diagnosis not present

## 2024-02-03 DIAGNOSIS — G4700 Insomnia, unspecified: Secondary | ICD-10-CM | POA: Diagnosis not present

## 2024-02-03 DIAGNOSIS — E7800 Pure hypercholesterolemia, unspecified: Secondary | ICD-10-CM | POA: Diagnosis not present

## 2024-02-03 DIAGNOSIS — K5900 Constipation, unspecified: Secondary | ICD-10-CM | POA: Diagnosis not present

## 2024-02-03 DIAGNOSIS — H903 Sensorineural hearing loss, bilateral: Secondary | ICD-10-CM | POA: Diagnosis not present

## 2024-02-03 DIAGNOSIS — Z1331 Encounter for screening for depression: Secondary | ICD-10-CM | POA: Diagnosis not present

## 2024-02-03 DIAGNOSIS — M25562 Pain in left knee: Secondary | ICD-10-CM | POA: Diagnosis not present

## 2024-02-03 DIAGNOSIS — Z87440 Personal history of urinary (tract) infections: Secondary | ICD-10-CM | POA: Diagnosis not present

## 2024-02-03 DIAGNOSIS — I2510 Atherosclerotic heart disease of native coronary artery without angina pectoris: Secondary | ICD-10-CM | POA: Diagnosis not present

## 2024-02-03 DIAGNOSIS — R1310 Dysphagia, unspecified: Secondary | ICD-10-CM | POA: Diagnosis not present

## 2024-02-03 DIAGNOSIS — L089 Local infection of the skin and subcutaneous tissue, unspecified: Secondary | ICD-10-CM | POA: Diagnosis not present

## 2024-02-03 DIAGNOSIS — J439 Emphysema, unspecified: Secondary | ICD-10-CM | POA: Diagnosis not present

## 2024-03-03 ENCOUNTER — Encounter: Payer: Self-pay | Admitting: Orthopedic Surgery

## 2024-03-14 ENCOUNTER — Encounter: Payer: Self-pay | Admitting: Orthopedic Surgery

## 2024-03-15 DIAGNOSIS — Z23 Encounter for immunization: Secondary | ICD-10-CM | POA: Diagnosis not present

## 2024-03-15 NOTE — Telephone Encounter (Signed)
 Dr Addie already responded, see other message

## 2024-03-17 DIAGNOSIS — M9901 Segmental and somatic dysfunction of cervical region: Secondary | ICD-10-CM | POA: Diagnosis not present

## 2024-03-17 DIAGNOSIS — M5011 Cervical disc disorder with radiculopathy,  high cervical region: Secondary | ICD-10-CM | POA: Diagnosis not present

## 2024-03-18 ENCOUNTER — Encounter: Payer: Self-pay | Admitting: Orthopedic Surgery

## 2024-03-18 ENCOUNTER — Other Ambulatory Visit (HOSPITAL_COMMUNITY)

## 2024-03-18 NOTE — Progress Notes (Signed)
 Surgical Instructions   Your procedure is scheduled on Tuesday, October 21st, 2025. Report to [facility] Main Entrance A at 5:30 A.M., then check in with the Admitting office. Any questions or running late day of surgery: call [phone]  Questions prior to your surgery date: call [phone], Monday-Friday, 8am-4pm. If you experience any cold or flu symptoms such as cough, fever, chills, shortness of breath, etc. between now and your scheduled surgery, please notify us  at the above number.     Remember:  Do not eat after midnight the night before your surgery  You may drink clear liquids until 4:30 the morning of your surgery.   Clear liquids allowed are: Water , Non-Citrus Juices (without pulp), Carbonated Beverages, Clear Tea (no milk, honey, etc.), Black Coffee Only (NO MILK, CREAM OR POWDERED CREAMER of any kind), and Gatorade.  Patient Instructions  The night before surgery:  No food after midnight. ONLY clear liquids after midnight  The day of surgery (if you do NOT have diabetes):  Drink ONE (1) Pre-Surgery Clear Ensure by 4:30 the morning of surgery. Drink in one sitting. Do not sip.  This drink was given to you during your hospital  pre-op appointment visit.  Nothing else to drink after completing the  Pre-Surgery Clear Ensure.           If you have questions, please contact your surgeon's office.     Take these medicines the morning of surgery with A SIP OF WATER : None.   May take these medicines IF NEEDED: None.     One week prior to surgery, STOP taking any Aspirin  (unless otherwise instructed by your surgeon) Aleve, Naproxen, Ibuprofen, Motrin, Advil, Goody's, BC's, all herbal medications, fish oil, and non-prescription vitamins.                     Do NOT Smoke (Tobacco/Vaping) for 24 hours prior to your procedure.  If you use a CPAP at night, you may bring your mask/headgear for your overnight stay.   You will be asked to remove any contacts,  glasses, piercing's, hearing aid's, dentures/partials prior to surgery. Please bring cases for these items if needed.    Patients discharged the day of surgery will not be allowed to drive home, and someone needs to stay with them for 24 hours.  SURGICAL WAITING ROOM VISITATION Patients may have no more than 2 support people in the waiting area - these visitors may rotate.   Pre-op nurse will coordinate an appropriate time for 1 ADULT support person, who may not rotate, to accompany patient in pre-op.  Children under the age of 69 must have an adult with them who is not the patient and must remain in the main waiting area with an adult.  If the patient needs to stay at the hospital during part of their recovery, the visitor guidelines for inpatient rooms apply.  Please refer to the [facility] website for the visitor guidelines for any additional information.   If you received a COVID test during your pre-op visit  it is requested that you wear a mask when out in public, stay away from anyone that may not be feeling well and notify your surgeon if you develop symptoms. If you have been in contact with anyone that has tested positive in the last 10 days please notify you surgeon.      Pre-operative 4 CHG Bathing Instructions   You can play a key role in reducing the risk of infection after  surgery. Your skin needs to be as free of germs as possible. You can reduce the number of germs on your skin by washing with CHG (chlorhexidine  gluconate) soap before surgery. CHG is an antiseptic soap that kills germs and continues to kill germs even after washing.   DO NOT use if you have an allergy to chlorhexidine /CHG or antibacterial soaps. If your skin becomes reddened or irritated, stop using the CHG and notify one of our RNs at [phone].   Please shower with the CHG soap starting 4 days before surgery using the following schedule:     Please keep in mind the following:  DO NOT shave,  including legs and underarms, starting the day of your first shower.   You may shave your face at any point before/day of surgery.  Place clean sheets on your bed the day you start using CHG soap. Use a clean washcloth (not used since being washed) for each shower. DO NOT sleep with pets once you start using the CHG.   CHG Shower Instructions:  Wash your face and private area with normal soap. If you choose to wash your hair, wash first with your normal shampoo.  After you use shampoo/soap, rinse your hair and body thoroughly to remove shampoo/soap residue.  Turn the water  OFF and apply  bottle of CHG soap to a CLEAN washcloth.  Apply CHG soap ONLY FROM YOUR NECK DOWN TO YOUR TOES (washing for 3-5 minutes)  DO NOT use CHG soap on face, private areas, open wounds, or sores.  Pay special attention to the area where your surgery is being performed.  If you are having back surgery, having someone wash your back for you may be helpful. Wait 2 minutes after CHG soap is applied, then you may rinse off the CHG soap.  Pat dry with a clean towel  Put on clean clothes/pajamas   If you choose to wear lotion, please use ONLY the CHG-compatible lotions that are listed below.  Additional instructions for the day of surgery:  If you choose, you may shower the morning of surgery with an antibacterial soap.  DO NOT APPLY any lotions, deodorants, cologne, or perfumes.   Do not bring valuables to the hospital. [facility] is not responsible for any belongings/valuables. Do not wear nail polish, gel polish, artificial nails, or any other type of covering on natural nails (fingers and toes) Do not wear jewelry or makeup Put on clean/comfortable clothes.  Please brush your teeth.  Ask your nurse before applying any prescription medications to the skin.     CHG Compatible Lotions   Aveeno Moisturizing lotion  Cetaphil Moisturizing Cream  Cetaphil Moisturizing Lotion  Clairol Herbal Essence  Moisturizing Lotion, Dry Skin  Clairol Herbal Essence Moisturizing Lotion, Extra Dry Skin  Clairol Herbal Essence Moisturizing Lotion, Normal Skin  Curel Age Defying Therapeutic Moisturizing Lotion with Alpha Hydroxy  Curel Extreme Care Body Lotion  Curel Soothing Hands Moisturizing Hand Lotion  Curel Therapeutic Moisturizing Cream, Fragrance-Free  Curel Therapeutic Moisturizing Lotion, Fragrance-Free  Curel Therapeutic Moisturizing Lotion, Original Formula  Eucerin Daily Replenishing Lotion  Eucerin Dry Skin Therapy Plus Alpha Hydroxy Crme  Eucerin Dry Skin Therapy Plus Alpha Hydroxy Lotion  Eucerin Original Crme  Eucerin Original Lotion  Eucerin Plus Crme Eucerin Plus Lotion  Eucerin TriLipid Replenishing Lotion  Keri Anti-Bacterial Hand Lotion  Keri Deep Conditioning Original Lotion Dry Skin Formula Softly Scented  Keri Deep Conditioning Original Lotion, Fragrance Free Sensitive Skin Formula  Keri Lotion Fast Absorbing  Fragrance Free Sensitive Skin Formula  Keri Lotion Fast Absorbing Softly Scented Dry Skin Formula  Keri Original Lotion  Keri Skin Renewal Lotion Keri Silky Smooth Lotion  Keri Silky Smooth Sensitive Skin Lotion  Nivea Body Creamy Conditioning Oil  Nivea Body Extra Enriched Lotion  Nivea Body Original Lotion  Nivea Body Sheer Moisturizing Lotion Nivea Crme  Nivea Skin Firming Lotion  NutraDerm 30 Skin Lotion  NutraDerm Skin Lotion  NutraDerm Therapeutic Skin Cream  NutraDerm Therapeutic Skin Lotion  ProShield Protective Hand Cream  Provon moisturizing lotion  Please read over the following fact sheets that you were given.

## 2024-03-19 ENCOUNTER — Other Ambulatory Visit: Payer: Self-pay

## 2024-03-19 ENCOUNTER — Encounter (HOSPITAL_COMMUNITY): Payer: Self-pay

## 2024-03-19 ENCOUNTER — Encounter (HOSPITAL_COMMUNITY)
Admission: RE | Admit: 2024-03-19 | Discharge: 2024-03-19 | Disposition: A | Discharge status: Home / Self Care | Source: Ambulatory Visit | Attending: Orthopedic Surgery | Admitting: Orthopedic Surgery

## 2024-03-19 VITALS — BP 156/74 | HR 77 | Temp 98.4°F | Resp 17 | Ht 66.5 in | Wt 135.6 lb

## 2024-03-19 DIAGNOSIS — Z85828 Personal history of other malignant neoplasm of skin: Secondary | ICD-10-CM | POA: Insufficient documentation

## 2024-03-19 DIAGNOSIS — M1712 Unilateral primary osteoarthritis, left knee: Secondary | ICD-10-CM | POA: Insufficient documentation

## 2024-03-19 DIAGNOSIS — Z01812 Encounter for preprocedural laboratory examination: Secondary | ICD-10-CM | POA: Diagnosis not present

## 2024-03-19 DIAGNOSIS — Z87891 Personal history of nicotine dependence: Secondary | ICD-10-CM | POA: Diagnosis not present

## 2024-03-19 DIAGNOSIS — Z01818 Encounter for other preprocedural examination: Secondary | ICD-10-CM

## 2024-03-19 LAB — BASIC METABOLIC PANEL WITH GFR
Anion gap: 11 (ref 5–15)
BUN: 22 mg/dL (ref 8–23)
CO2: 25 mmol/L (ref 22–32)
Calcium: 9.3 mg/dL (ref 8.9–10.3)
Chloride: 103 mmol/L (ref 98–111)
Creatinine, Ser: 0.7 mg/dL (ref 0.44–1.00)
GFR, Estimated: >60 mL/min (ref >60)
Glucose, Bld: 108 mg/dL — ABNORMAL HIGH (ref 70–99)
Potassium: 3.9 mmol/L (ref 3.5–5.1)
Sodium: 139 mmol/L (ref 135–145)

## 2024-03-19 LAB — SURGICAL PCR SCREEN
MRSA, PCR: NEGATIVE
Staphylococcus aureus: NEGATIVE

## 2024-03-19 LAB — URINALYSIS, W/ REFLEX TO CULTURE (INFECTION SUSPECTED)
Bilirubin Urine: NEGATIVE
Glucose, UA: NEGATIVE mg/dL
Hgb urine dipstick: NEGATIVE
Ketones, ur: NEGATIVE mg/dL
Nitrite: POSITIVE — AB
Protein, ur: NEGATIVE mg/dL
Specific Gravity, Urine: 1.013 (ref 1.005–1.030)
pH: 6 (ref 5.0–8.0)

## 2024-03-19 LAB — CBC
HCT: 44.5 % (ref 36.0–46.0)
Hemoglobin: 14.4 g/dL (ref 12.0–15.0)
MCH: 27.5 pg (ref 26.0–34.0)
MCHC: 32.4 g/dL (ref 30.0–36.0)
MCV: 84.9 fL (ref 80.0–100.0)
Platelets: 261 K/uL (ref 150–400)
RBC: 5.24 MIL/uL — ABNORMAL HIGH (ref 3.87–5.11)
RDW: 14.5 % (ref 11.5–15.5)
WBC: 10 K/uL (ref 4.0–10.5)
nRBC: 0 % (ref 0.0–0.2)

## 2024-03-19 NOTE — Progress Notes (Signed)
 PCP - Alm Seabron COME Cardiologist - denies Pulmonologist - Lorence Mace Arumairaj,MD,Matthew Carlin Buren COME- follow up on as needed basis 01/29/24 visit  PPM/ICD - denies Device Orders -  Rep Notified -   Chest x-ray -  EKG - 05/13/23 Stress Test - denies ECHO - denies Cardiac Cath - denies  Sleep Study - denies CPAP -   Fasting Blood Sugar - na Checks Blood Sugar _____ times a day  Last dose of GLP1 agonist-  na GLP1 instructions:   Blood Thinner Instructions:na Aspirin  Instructions:na  ERAS Protcol -clears until 0430 PRE-SURGERY Ensure or G2- Ensure  COVID TEST- na   Anesthesia review: yes- hx bronchiectasis. Chart reviewed by Allison Zelenak,PA-C prior to right knee surgery in April 2025.   Patient denies shortness of breath, fever, cough and chest pain at PAT appointment. Pt also concerned about finger infection on left index finger. Dr. Addie is aware and referred patient to Dr. Arlinda . Pt wearing a bandaid on her finger at PAT appointment. She expressed concern that her finger had not improved and was worried that it affect her upcoming surgery. I encouraged her to express her concerns to Dr. Addie.    All instructions explained to the patient, with a verbal understanding of the material. Patient agrees to go over the instructions while at home for a better understanding. The opportunity to ask questions was provided.

## 2024-03-20 ENCOUNTER — Other Ambulatory Visit: Payer: Self-pay | Admitting: Surgical

## 2024-03-22 ENCOUNTER — Ambulatory Visit: Admitting: Orthopedic Surgery

## 2024-03-22 ENCOUNTER — Other Ambulatory Visit: Payer: Self-pay | Admitting: Surgical

## 2024-03-22 ENCOUNTER — Other Ambulatory Visit: Payer: Self-pay

## 2024-03-22 DIAGNOSIS — L03012 Cellulitis of left finger: Secondary | ICD-10-CM

## 2024-03-22 MED ORDER — CIPROFLOXACIN HCL 250 MG PO TABS: 250.0000 mg | ORAL_TABLET | Freq: Two times a day (BID) | ORAL | 0 refills | Status: DC

## 2024-03-22 NOTE — Anesthesia Preprocedure Evaluation (Signed)
 Anesthesia Evaluation    Airway        Dental   Pulmonary former smoker          Cardiovascular      Neuro/Psych    GI/Hepatic   Endo/Other    Renal/GU      Musculoskeletal   Abdominal   Peds  Hematology   Anesthesia Other Findings   Reproductive/Obstetrics                              Anesthesia Physical Anesthesia Plan  ASA:   Anesthesia Plan:    Post-op Pain Management:    Induction:   PONV Risk Score and Plan:   Airway Management Planned:   Additional Equipment:   Intra-op Plan:   Post-operative Plan:   Informed Consent:   Plan Discussed with:   Anesthesia Plan Comments: (PAT note written 03/22/2024 by Christy Ehrsam, PA-C.  )        Anesthesia Quick Evaluation

## 2024-03-22 NOTE — Progress Notes (Addendum)
 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   History includes former smoker (quit 12/27/79), post-operative N/V, hiatal hernia, GERD, skin cancer (BCC, SCC), osteoarthritis (left THA 01/29/19; right THA 09/24/19; right knee patellofemoral arthroplasty 10/07/23), hernia (right IHR 08/22/21).    She had evaluations by neurology and ENT for dizziness/balance issues in late 2024 (~ Oct-Dec). Neurology ordered MRI c-spine that showed multilevel degenerative changes, most prominent at C5-6, with evidence of bilateral foraminal narrowing, left worse than right, only mild canal stenosis with no evidence of cord compression. She had a normal head CT. ENT recommended vestibular rehab, otherwise as needed follow-up. Per neurology encounter from 07/10/2023, physical therapy had been helpful in managing her unsteady gait and dizziness.   She had pulmonology evaluation for coughing with mild bronchiectasis of prior CT imaging. She was normal spirometry in 03/2023. Chronic cough had mostly improved following course of Augmentin , so pulmonologist Dr. Mitchael advised as needed follow-up at 04/23/23 visit; However, she had a follow-up visit with Buren Cough, MD on 01/29/2024. She was minimally symptomatic with regular use of Aerobika device QID. Again, as needed pulmonology follow-up discussed.    She had a left index finger paronychia that has been followed by Arlinda Buster, MD. At 12/22/2023 follow-up, he felt finger was improving. No need for I&D and continue conservative management. As needed follow-up. She  has been in communication with Dr. Addie about her left index finger paronychia not being completely resolved. She apparently still have some uncertainty about if her finger issue could interfere with timing of her surgery, so Dr. Addie is going to evaluate her finger on 03/22/2024 at 2:30 PM.   03/19/2024 UA was hazy, trace leukocytes, positive nitrites, many bacteria, 0-5 squamous epithelial cells, urine WBC 0-5. Lab did not perform a reflex urine culture because urine WBC and squamous epithelial were < 5. I also sent a message about this to Dr. Addie and his PA Herlene, and she was prescribed Cipro . Other preoperative labs are unremarkable. 05/07/2023 EKG showed NSR.   Plans to proceed as scheduled may depend on finger evaluation by Dr. Addie on 03/22/2024. If remains scheduled then anesthesia team to evaluate on the day of surgery.    VS: BP (!) 156/74   Pulse 77   Temp 36.9 C   Resp 17   Ht 5' 6.5 (1.689 m)   Wt 61.5 kg   SpO2 100%   BMI 21.56 kg/m    PROVIDERS: Seabron Lenis, MD is PCP Onita Duos, MD is neurologist Jesus Oliphant, MD is ENT. Consider vestibular rehab for balance issues. Had already had neurology evaluation. As needed follow-up at 05/08/23 visit.  Mitchael Poe, MD is pulmonologist. As needed follow-up at 04/23/23 visit for chronic cough, mostly resolved post Augmentin . As needed follow-up also advised at 01/29/2024 visit with Buren Cough, MD.    LABS: Preoperative labs noted. U (all labs ordered are listed, but only abnormal results are displayed)  Labs Reviewed  CBC - Abnormal; Notable for the following components:      Result Value   RBC 5.24 (*)    All other  components within normal limits  BASIC METABOLIC PANEL WITH GFR - Abnormal; Notable for the following components:   Glucose, Bld 108 (*)    All other components within normal limits  URINALYSIS, W/ REFLEX TO CULTURE (INFECTION SUSPECTED) - Abnormal; Notable for the following components:   APPearance HAZY (*)     Nitrite POSITIVE (*)    Leukocytes,Ua TRACE (*)    Bacteria, UA MANY (*)    All other components within normal limits  SURGICAL PCR SCREEN    OTHER: Spirometry 03/06/23 ([facility]): Summary  Normal spirometry.   METHACHOLINE CHALLENGE  Patient reached level 5 at dose 4.0 mg with no resulting FEV1 PD20. Results do not show bronchial hyperresponsiveness. Patient discharged with FEV 1 within 10% of control.      Walk Test 10/04/22 ([facility]): At rest:  HR - At Rest: 68  BP - At Rest: 154/86  At Rest SpO2: 100 %  At Rest O2 Device: None (Room air)   During exertion:  HR - Peak: 87  HR - 1 Minute Recovery: 66  HR - Age Predicted Max (%): 56.2  BP - Test End: 145/79  With Ambulation/Activity SpO2: 97 %      IMAGES: CT Head 05/10/23: IMPRESSION: Normal head CT.   MRI C-spine 04/06/23: IMPRESSION:  MRI scan of cervical spine without contrast showing prominent spondylitic changes from C3-C7 most prominent at C5-6 where there is broad-based disc osteophyte protrusion with mild canal and left greater than right severe foraminal narrowing possible impingement of the exiting nerve root on the left.    CT Chest 02/24/23: IMPRESSION: 1. Chronic areas of bronchiectasis in the anterior right upper lobe, right middle lobe and lingula with areas of volume loss and peribronchial opacity, overall mild improvement from prior exam. Lesser areas of bronchiectasis and peribronchovascular nodularity within the lower lobes, with definite basilar improvement. 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 2. Small hiatal hernia. 3. Mild emphysema.     EKG: 05/07/23: NSR     CV: CT Cardiac Scoring 11/16/20: FINDINGS: CORONARY CALCIUM SCORES: Left Main: 0 LAD: 6 LCx: 0 RCA: 0   Total Agatston Score: 6 MESA database percentile: 33rd   AORTA MEASUREMENTS: Ascending Aorta: 29 mm Descending Aorta: 22 mm   IMPRESSION: 1. Patient's total coronary artery calcium  score is 6 which is 33rd percentile for patient's of matched age, gender and race/ethnicity. Please note that although the presence of coronary artery calcium documents the presence of coronary artery disease, the severity of this disease and any potential stenosis cannot be assessed on this noncontrast CT examination. Assessment for potential risk factor modification, dietary therapy or pharmacologic therapy may be warranted, if clinically indicated.  Past Medical History:  Diagnosis Date   Atypical mole 11/03/2013   R prox post lat thigh    Basal cell carcinoma    mid back   Basal cell carcinoma 04/01/2019   L chest parasternal    Basal cell carcinoma 09/11/2017   L mid back at braline 3.0 cm lat to spine   Emphysema lung (HCC)    Mild no symptoms Found on coronary CT scan   Esophageal dysmotility    GERD (gastroesophageal reflux disease)    occassional   History of hiatal hernia    OA (osteoarthritis)    hips and knees   Pneumonia    history of    PONV (postoperative nausea and vomiting)    Squamous cell carcinoma of skin 06/11/2021   L  mid pretibia, [facility]    Past Surgical History:  Procedure Laterality Date   COLONOSCOPY     FRACTURE SURGERY  2009   left ankle   INGUINAL HERNIA REPAIR Right 08/22/2021   Procedure: OPEN RIGHT INGUINAL HERNIA REPAIR WITH MESH;  Surgeon: Signe Mitzie LABOR, MD;  Location: [facility] ORS;  Service: General;  Laterality: Right;   LAPAROSCOPY  1992   infection after tubal   PATELLA-FEMORAL ARTHROPLASTY Right 10/07/2023   Procedure: RIGHT KNEE PATELLOFEMORAL ARTHROPLASTY;  Surgeon: Addie Cordella Hamilton, MD;  Location: [facility];  Service: Orthopedics;  Laterality: Right;   SHOULDER OPEN ROTATOR CUFF REPAIR Right 2009   TOTAL HIP ARTHROPLASTY Left 01/29/2019   Procedure: LEFT TOTAL HIP ARTHROPLASTY ANTERIOR APPROACH;  Surgeon: Vernetta Lonni GRADE, MD;  Location: [facility] ORS;  Service: Orthopedics;  Laterality: Left;   TOTAL HIP ARTHROPLASTY Right 09/24/2019    Procedure: RIGHT TOTAL HIP ARTHROPLASTY ANTERIOR APPROACH;  Surgeon: Vernetta Lonni GRADE, MD;  Location: [facility] ORS;  Service: Orthopedics;  Laterality: Right;   TUBAL LIGATION  1992    MEDICATIONS:  amoxicillin  (AMOXIL ) 500 MG tablet   bimatoprost  (LATISSE ) 0.03 % ophthalmic solution   Calcium Carb-Cholecalciferol (CALCIUM 600 + D PO)   CRANBERRY PO   estradiol (ESTRACE) 0.01 % CREA vaginal cream   ibuprofen (ADVIL) 200 MG tablet   Misc Natural Products (BEET ROOT PO)   Multiple Vitamins-Minerals (MEGAVITE FRUITS & VEGGIES PO)   Multiple Vitamins-Minerals (MULTIVITAMIN WITH MINERALS) tablet   mupirocin  ointment (BACTROBAN ) 2 %   Oil of Oregano 1500 MG CAPS   OVER THE COUNTER MEDICATION   pantoprazole  (PROTONIX ) 40 MG tablet   pravastatin (PRAVACHOL) 10 MG tablet   Probiotic Product (PROBIOTIC PO)   RHUBARB PO   TURMERIC EXTRA STRENGTH PO   vitamin B-12 (CYANOCOBALAMIN ) 1000 MCG tablet   No current facility-administered medications for this encounter.  Amoxicillin  is pre-dental.   Isaiah Ruder, PA-C Surgical Short Stay/Anesthesiology [facility] Phone [phone] [facility] Phone [phone] 03/22/2024 11:46 AM

## 2024-03-23 ENCOUNTER — Encounter: Payer: Self-pay | Admitting: Orthopedic Surgery

## 2024-03-23 ENCOUNTER — Encounter (HOSPITAL_COMMUNITY): Admission: RE | Payer: Self-pay | Source: Home / Self Care

## 2024-03-23 ENCOUNTER — Ambulatory Visit (HOSPITAL_COMMUNITY): Admission: RE | Admit: 2024-03-23 | Source: Home / Self Care | Admitting: Orthopedic Surgery

## 2024-03-23 ENCOUNTER — Encounter (HOSPITAL_COMMUNITY): Payer: Self-pay | Admitting: Registered Nurse

## 2024-03-23 ENCOUNTER — Encounter (HOSPITAL_COMMUNITY): Payer: Self-pay | Admitting: Vascular Surgery

## 2024-03-23 DIAGNOSIS — Z01818 Encounter for other preprocedural examination: Secondary | ICD-10-CM

## 2024-03-23 SURGERY — ARTHROPLASTY, KNEE, UNICOMPARTMENTAL
Anesthesia: Spinal | Site: Knee | Laterality: Left

## 2024-03-23 NOTE — Progress Notes (Signed)
 Office Visit Note   Patient: Beverly [name]           Date of Birth: 1946-01-14           MRN: [id number] Visit Date: 03/22/2024 Requested by: Seabron Lenis, MD [phone] [address] [place],  [place] [address] PCP: Seabron Lenis, MD  Subjective: Chief Complaint  Patient presents with   Other    Patient reports chronic left index finger injection and wanted it checked before scheduled PF surgery scheduled, 03/23/24    HPI: Beverly [name] is a 78 y.o. female who presents to the office reporting left index finger infection.  She had a paronychia which has been chronic for about 3 to 4 months.  Last seen in July.  Since that time she has had recurrent pain with episodic small amount of drainage.  She is using soaks as well as NeuroPro send.  She also has a positive nitrate and bacteria urinary tract infection based on urinalysis.  Scheduled for patellofemoral replacement this week but that will be canceled because of her current problem with the left index finger..                ROS: All systems reviewed are negative as they relate to the chief complaint within the history of present illness.  Patient denies fevers or chills.  Assessment & Plan: Visit Diagnoses:  1. 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  Currently there is nail plate involvement with erosion.  She has had this now for about 4 months with no improvement with conservative treatment.  I think operative treatment is indicated however, that is from a nonfellowship trained hand surgery perspective.  Nonetheless I would like for her to see Dr. Kuzma for another opinion about her index finger so we can get it resolved in order to proceed with her elective patellofemoral replacement on the left-hand side.  Radiographs negative for osteomyelitis.  We will put her on Cipro  for 3 days for her likely urinary tract infection.  Follow-Up Instructions: No follow-ups  on file.   Orders:  Orders Placed This Encounter  Procedures   XR Finger Index Left   Ambulatory referral to Orthopedic Surgery   No orders of the defined types were placed in this encounter.     Procedures: No procedures performed   Clinical Data: No additional findings.  Objective: Vital Signs: There were no vitals taken for this visit.  Physical Exam:  Constitutional: Patient appears well-developed HEENT:  Head: Normocephalic Eyes:EOM are normal Neck: Normal range of motion Cardiovascular: Normal rate Pulmonary/chest: Effort normal Neurologic: Patient is alert Skin: Skin is warm Psychiatric: Patient has normal mood and affect  Ortho Exam: Ortho exam demonstrates no effusion in either knee.  Index finger on the left demonstrates swelling erythema and induration around the proximal aspect of the nail bed.  There are some nail erosion on the radial aspect of the proximal aspect of the index nail plate.  No expressible drainage is present but it is tender to palpation.  No tenderness on the volar side of that index finger.  Specialty Comments:  No specialty comments available.  Imaging: No results found.   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   Primary osteoarthritis of left hip 12/07/2018   Primary osteoarthritis of right hip 12/07/2018   Pain of right hip joint 05/21/2017   Trochanteric bursitis, right hip 04/23/2017   Pain in right hip 04/23/2017   Hearing loss of right ear due to cerumen impaction 10/25/2016   Chronic rhinitis 07/04/2015   Cough 07/04/2015   Endometrial  polyp 01/11/2011   Past Medical History:  Diagnosis Date   Atypical mole 11/03/2013   R prox post lat thigh    Basal cell carcinoma    mid back   Basal cell carcinoma 04/01/2019   L chest parasternal    Basal cell carcinoma 09/11/2017   L mid back at braline 3.0 cm lat to spine   Emphysema lung (HCC)    Mild no symptoms Found on coronary CT scan   Esophageal dysmotility    GERD (gastroesophageal reflux disease)    occassional   History of hiatal hernia    OA (osteoarthritis)    hips and knees   Pneumonia    history of    PONV (postoperative nausea and vomiting)    Squamous cell carcinoma of skin 06/11/2021   L mid pretibia, [facility]    Family History  Problem Relation Age of Onset   Allergic rhinitis Neg Hx    Angioedema Neg Hx    Asthma Neg Hx    Atopy Neg Hx    Eczema Neg Hx    Immunodeficiency Neg Hx    Urticaria Neg Hx     Past Surgical History:  Procedure Laterality Date   COLONOSCOPY     FRACTURE SURGERY  2009   left ankle   INGUINAL HERNIA REPAIR Right 08/22/2021   Procedure: OPEN RIGHT INGUINAL HERNIA REPAIR WITH MESH;  Surgeon: Signe Mitzie LABOR, MD;  Location: [facility];  Service: General;  Laterality: Right;   LAPAROSCOPY  1992   infection after tubal   PATELLA-FEMORAL ARTHROPLASTY Right 10/07/2023   Procedure: RIGHT KNEE PATELLOFEMORAL ARTHROPLASTY;  Surgeon: Addie Cordella Hamilton, MD;  Location: [facility] OR;  Service: Orthopedics;  Laterality: Right;   SHOULDER OPEN ROTATOR CUFF REPAIR Right 2009   TOTAL HIP ARTHROPLASTY Left 01/29/2019   Procedure: LEFT TOTAL HIP ARTHROPLASTY ANTERIOR APPROACH;  Surgeon: Vernetta Lonni GRADE, MD;  Location: [facility];  Service: Orthopedics;  Laterality: Left;   TOTAL HIP ARTHROPLASTY Right 09/24/2019   Procedure: RIGHT TOTAL HIP ARTHROPLASTY ANTERIOR APPROACH;  Surgeon: Vernetta Lonni GRADE, MD;  Location: [facility];  Service: Orthopedics;  Laterality: Right;   TUBAL LIGATION  1992   Social History   Occupational History   Not on  file  Tobacco Use   Smoking status: Former    Current packs/day: 0.00    Types: Cigarettes    Quit date: 12/27/1979    Years since quitting: 44.2   Smokeless tobacco: Never  Vaping Use   Vaping status: Never Used  Substance and Sexual Activity   Alcohol use: Yes    Alcohol/week: 1.0 standard drink of alcohol    Types: 1 Glasses of wine per week    Comment: 1-2 a week   Drug use: No   Sexual activity: Not Currently    Birth control/protection: Post-menopausal

## 2024-03-24 DIAGNOSIS — M5011 Cervical disc disorder with radiculopathy,  high cervical region: Secondary | ICD-10-CM | POA: Diagnosis not present

## 2024-03-24 DIAGNOSIS — M9901 Segmental and somatic dysfunction of cervical region: Secondary | ICD-10-CM | POA: Diagnosis not present

## 2024-03-26 ENCOUNTER — Other Ambulatory Visit: Payer: Self-pay | Admitting: Orthopedic Surgery

## 2024-03-26 DIAGNOSIS — M79645 Pain in left finger(s): Secondary | ICD-10-CM | POA: Diagnosis not present

## 2024-03-26 DIAGNOSIS — M6740 Ganglion, unspecified site: Secondary | ICD-10-CM | POA: Diagnosis not present

## 2024-03-26 DIAGNOSIS — M19042 Primary osteoarthritis, left hand: Secondary | ICD-10-CM | POA: Diagnosis not present

## 2024-03-30 ENCOUNTER — Other Ambulatory Visit: Payer: Self-pay

## 2024-03-30 ENCOUNTER — Encounter (HOSPITAL_BASED_OUTPATIENT_CLINIC_OR_DEPARTMENT_OTHER): Payer: Self-pay | Admitting: Orthopedic Surgery

## 2024-03-31 DIAGNOSIS — M9901 Segmental and somatic dysfunction of cervical region: Secondary | ICD-10-CM | POA: Diagnosis not present

## 2024-03-31 DIAGNOSIS — M5011 Cervical disc disorder with radiculopathy,  high cervical region: Secondary | ICD-10-CM | POA: Diagnosis not present

## 2024-04-05 ENCOUNTER — Encounter: Payer: Self-pay | Admitting: Radiology

## 2024-04-05 ENCOUNTER — Other Ambulatory Visit: Payer: Self-pay | Admitting: Medical Genetics

## 2024-04-05 DIAGNOSIS — Z006 Encounter for examination for normal comparison and control in clinical research program: Secondary | ICD-10-CM

## 2024-04-06 ENCOUNTER — Encounter (HOSPITAL_BASED_OUTPATIENT_CLINIC_OR_DEPARTMENT_OTHER): Payer: Self-pay | Admitting: Orthopedic Surgery

## 2024-04-06 ENCOUNTER — Encounter (HOSPITAL_BASED_OUTPATIENT_CLINIC_OR_DEPARTMENT_OTHER)
Admission: RE | Disposition: A | Discharge status: Home / Self Care | Payer: Self-pay | Source: Home / Self Care | Attending: Orthopedic Surgery

## 2024-04-06 ENCOUNTER — Ambulatory Visit (HOSPITAL_BASED_OUTPATIENT_CLINIC_OR_DEPARTMENT_OTHER)
Admission: RE | Admit: 2024-04-06 | Discharge: 2024-04-06 | Disposition: A | Discharge status: Home / Self Care | Attending: Orthopedic Surgery | Admitting: Orthopedic Surgery

## 2024-04-06 ENCOUNTER — Other Ambulatory Visit: Payer: Self-pay

## 2024-04-06 ENCOUNTER — Ambulatory Visit (HOSPITAL_BASED_OUTPATIENT_CLINIC_OR_DEPARTMENT_OTHER)

## 2024-04-06 DIAGNOSIS — L608 Other nail disorders: Secondary | ICD-10-CM | POA: Diagnosis present

## 2024-04-06 DIAGNOSIS — M6740 Ganglion, unspecified site: Secondary | ICD-10-CM | POA: Diagnosis not present

## 2024-04-06 DIAGNOSIS — Z01818 Encounter for other preprocedural examination: Secondary | ICD-10-CM

## 2024-04-06 DIAGNOSIS — Z8719 Personal history of other diseases of the digestive system: Secondary | ICD-10-CM | POA: Insufficient documentation

## 2024-04-06 DIAGNOSIS — J449 Chronic obstructive pulmonary disease, unspecified: Secondary | ICD-10-CM | POA: Insufficient documentation

## 2024-04-06 DIAGNOSIS — Z87891 Personal history of nicotine dependence: Secondary | ICD-10-CM | POA: Diagnosis not present

## 2024-04-06 DIAGNOSIS — K219 Gastro-esophageal reflux disease without esophagitis: Secondary | ICD-10-CM | POA: Insufficient documentation

## 2024-04-06 DIAGNOSIS — M19042 Primary osteoarthritis, left hand: Secondary | ICD-10-CM | POA: Insufficient documentation

## 2024-04-06 DIAGNOSIS — M67442 Ganglion, left hand: Secondary | ICD-10-CM

## 2024-04-06 HISTORY — PX: ARTHROTOMY: SHX5728

## 2024-04-06 HISTORY — PX: CYST REMOVAL HAND: SHX6279

## 2024-04-06 SURGERY — REMOVAL, CYST, HAND
Anesthesia: General | Site: Index Finger | Laterality: Left

## 2024-04-06 MED ORDER — PROPOFOL 500 MG/50ML IV EMUL
INTRAVENOUS | Status: DC | PRN
  Administered 2024-04-06: 150 ug/kg/min via INTRAVENOUS

## 2024-04-06 MED ORDER — PHENYLEPHRINE 80 MCG/ML (10ML) SYRINGE FOR IV PUSH (FOR BLOOD PRESSURE SUPPORT)
PREFILLED_SYRINGE | INTRAVENOUS | Status: AC
  Filled 2024-04-06: qty 10

## 2024-04-06 MED ORDER — 0.9 % SODIUM CHLORIDE (POUR BTL) OPTIME
TOPICAL | Status: DC | PRN
  Administered 2024-04-06: 120 mL

## 2024-04-06 MED ORDER — SUCCINYLCHOLINE CHLORIDE 200 MG/10ML IV SOSY
PREFILLED_SYRINGE | INTRAVENOUS | Status: AC
  Filled 2024-04-06: qty 30

## 2024-04-06 MED ORDER — LABETALOL HCL 5 MG/ML IV SOLN
10.0000 mg | Freq: Once | INTRAVENOUS | Status: AC
  Administered 2024-04-06: 10 mg via INTRAVENOUS

## 2024-04-06 MED ORDER — KETOROLAC TROMETHAMINE 30 MG/ML IJ SOLN
INTRAMUSCULAR | Status: DC | PRN
  Administered 2024-04-06: 15 mg via INTRAVENOUS

## 2024-04-06 MED ORDER — CEFAZOLIN SODIUM-DEXTROSE 2-4 GM/100ML-% IV SOLN
2.0000 g | INTRAVENOUS | Status: AC
  Administered 2024-04-06: 2 g via INTRAVENOUS

## 2024-04-06 MED ORDER — CEFAZOLIN SODIUM-DEXTROSE 2-4 GM/100ML-% IV SOLN
INTRAVENOUS | Status: AC
Start: 2024-04-06 — End: 2024-04-06
  Filled 2024-04-06: qty 100

## 2024-04-06 MED ORDER — ARTIFICIAL TEARS OPHTHALMIC OINT
TOPICAL_OINTMENT | OPHTHALMIC | Status: AC
  Filled 2024-04-06: qty 14

## 2024-04-06 MED ORDER — ONDANSETRON HCL 4 MG/2ML IJ SOLN
INTRAMUSCULAR | Status: DC | PRN
Start: 2024-04-06 — End: 2024-04-06
  Administered 2024-04-06: 4 mg via INTRAVENOUS

## 2024-04-06 MED ORDER — FENTANYL CITRATE (PF) 100 MCG/2ML IJ SOLN: 25.0000 ug | INTRAMUSCULAR | Status: DC | PRN

## 2024-04-06 MED ORDER — LACTATED RINGERS IV SOLN
INTRAVENOUS | Status: DC
Start: 2024-04-06 — End: 2024-04-06

## 2024-04-06 MED ORDER — DEXMEDETOMIDINE HCL IN NACL 80 MCG/20ML IV SOLN
INTRAVENOUS | Status: AC
Start: 2024-04-06 — End: 2024-04-06
  Filled 2024-04-06: qty 20

## 2024-04-06 MED ORDER — KETOROLAC TROMETHAMINE 30 MG/ML IJ SOLN
INTRAMUSCULAR | Status: AC
  Filled 2024-04-06: qty 1

## 2024-04-06 MED ORDER — PROPOFOL 10 MG/ML IV BOLUS
INTRAVENOUS | Status: DC | PRN
  Administered 2024-04-06: 30 mg via INTRAVENOUS
  Administered 2024-04-06: 100 mg via INTRAVENOUS
  Administered 2024-04-06: 50 mg via INTRAVENOUS

## 2024-04-06 MED ORDER — LIDOCAINE HCL (CARDIAC) PF 100 MG/5ML IV SOSY
PREFILLED_SYRINGE | INTRAVENOUS | Status: DC | PRN
  Administered 2024-04-06: 60 mg via INTRAVENOUS

## 2024-04-06 MED ORDER — HYDROCODONE-ACETAMINOPHEN 5-325 MG PO TABS: 1.0000 | ORAL_TABLET | Freq: Four times a day (QID) | ORAL | 0 refills | Status: AC | PRN

## 2024-04-06 MED ORDER — LABETALOL HCL 5 MG/ML IV SOLN
INTRAVENOUS | Status: AC
  Filled 2024-04-06: qty 4

## 2024-04-06 MED ORDER — BUPIVACAINE HCL (PF) 0.25 % IJ SOLN
INTRAMUSCULAR | Status: DC | PRN
  Administered 2024-04-06: 9 mL

## 2024-04-06 MED ORDER — ATROPINE SULFATE 0.4 MG/ML IV SOLN
INTRAVENOUS | Status: AC
Start: 2024-04-06 — End: 2024-04-06
  Filled 2024-04-06: qty 1

## 2024-04-06 MED ORDER — FENTANYL CITRATE (PF) 100 MCG/2ML IJ SOLN
INTRAMUSCULAR | Status: AC
  Filled 2024-04-06: qty 2

## 2024-04-06 MED ORDER — FENTANYL CITRATE (PF) 100 MCG/2ML IJ SOLN
INTRAMUSCULAR | Status: DC | PRN
  Administered 2024-04-06: 50 ug via INTRAVENOUS

## 2024-04-06 MED ORDER — ACETAMINOPHEN 10 MG/ML IV SOLN: 1000.0000 mg | Freq: Once | INTRAVENOUS | Status: DC | PRN

## 2024-04-06 SURGICAL SUPPLY — 45 items
BANDAGE GAUZE 1X75IN STRL (MISCELLANEOUS) IMPLANT
BENZOIN TINCTURE PRP APPL 2/3 (GAUZE/BANDAGES/DRESSINGS) IMPLANT
BLADE MINI RND TIP GREEN BEAV (BLADE) IMPLANT
BLADE SURG 15 STRL LF DISP TIS (BLADE) ×2 IMPLANT
BNDG COHESIVE 1X5 TAN STRL LF (GAUZE/BANDAGES/DRESSINGS) IMPLANT
BNDG COHESIVE 2X5 TAN ST LF (GAUZE/BANDAGES/DRESSINGS) IMPLANT
BNDG COMPR ESMARK 4X3 LF (GAUZE/BANDAGES/DRESSINGS) IMPLANT
BNDG ELASTIC 2INX 5YD STR LF (GAUZE/BANDAGES/DRESSINGS) IMPLANT
BNDG ELASTIC 3INX 5YD STR LF (GAUZE/BANDAGES/DRESSINGS) IMPLANT
BNDG GAUZE DERMACEA FLUFF 4 (GAUZE/BANDAGES/DRESSINGS) IMPLANT
BNDG PLASTER X FAST 3X3 WHT LF (CAST SUPPLIES) IMPLANT
CHLORAPREP W/TINT 26 (MISCELLANEOUS) ×1 IMPLANT
CORD BIPOLAR FORCEPS 12FT (ELECTRODE) ×1 IMPLANT
COVER BACK TABLE 60X90IN (DRAPES) ×1 IMPLANT
COVER MAYO STAND STRL (DRAPES) ×1 IMPLANT
CUFF TOURN SGL QUICK 18X4 (TOURNIQUET CUFF) ×1 IMPLANT
DRAPE EXTREMITY T 121X128X90 (DISPOSABLE) ×1 IMPLANT
DRAPE SURG 17X23 STRL (DRAPES) ×1 IMPLANT
GAUZE SPONGE 4X4 12PLY STRL (GAUZE/BANDAGES/DRESSINGS) ×1 IMPLANT
GAUZE STRETCH 2X75IN STRL (MISCELLANEOUS) IMPLANT
GAUZE XEROFORM 1X8 LF (GAUZE/BANDAGES/DRESSINGS) ×1 IMPLANT
GLOVE BIO SURGEON STRL SZ7.5 (GLOVE) ×1 IMPLANT
GLOVE BIOGEL PI IND STRL 7.0 (GLOVE) IMPLANT
GLOVE BIOGEL PI IND STRL 7.5 (GLOVE) IMPLANT
GLOVE BIOGEL PI IND STRL 8 (GLOVE) ×1 IMPLANT
GLOVE SURG SS PI 7.0 STRL IVOR (GLOVE) IMPLANT
GOWN STRL REUS W/ TWL LRG LVL3 (GOWN DISPOSABLE) ×1 IMPLANT
GOWN STRL REUS W/ TWL XL LVL3 (GOWN DISPOSABLE) IMPLANT
NDL HYPO 25X1 1.5 SAFETY (NEEDLE) ×1 IMPLANT
NEEDLE HYPO 25X1 1.5 SAFETY (NEEDLE) ×1 IMPLANT
PACK BASIN DAY SURGERY FS (CUSTOM PROCEDURE TRAY) ×1 IMPLANT
PAD CAST 3X4 CTTN HI CHSV (CAST SUPPLIES) IMPLANT
PAD CAST 4YDX4 CTTN HI CHSV (CAST SUPPLIES) IMPLANT
PADDING CAST ABS COTTON 4X4 ST (CAST SUPPLIES) ×1 IMPLANT
SLEEVE SCD COMPRESS KNEE MED (STOCKING) IMPLANT
SOLN 0.9% NACL POUR BTL 1000ML (IV SOLUTION) ×1 IMPLANT
SPLINT FINGER 5/8X3.25 (SOFTGOODS) IMPLANT
STOCKINETTE 4X48 STRL (DRAPES) ×1 IMPLANT
STRIP CLOSURE SKIN 1/2X4 (GAUZE/BANDAGES/DRESSINGS) IMPLANT
SUT ETHILON 3 0 PS 1 (SUTURE) IMPLANT
SUT ETHILON 4 0 PS 2 18 (SUTURE) ×1 IMPLANT
SYR BULB EAR ULCER 3OZ GRN STR (SYRINGE) ×1 IMPLANT
SYR CONTROL 10ML LL (SYRINGE) ×1 IMPLANT
TOWEL GREEN STERILE FF (TOWEL DISPOSABLE) ×2 IMPLANT
UNDERPAD 30X36 HEAVY ABSORB (UNDERPADS AND DIAPERS) ×1 IMPLANT

## 2024-04-06 NOTE — Anesthesia Preprocedure Evaluation (Addendum)
 Anesthesia Evaluation  Patient identified by MRN, date of birth, ID band Patient awake    Reviewed: Allergy & Precautions, NPO status , Patient's Chart, lab work & pertinent test results  History of Anesthesia Complications (+) PONV and history of anesthetic complications  Airway Mallampati: II  TM Distance: >3 FB Neck ROM: Full    Dental no notable dental hx.    Pulmonary COPD, former smoker   Pulmonary exam normal        Cardiovascular negative cardio ROS  Rhythm:Regular Rate:Normal     Neuro/Psych negative neurological ROS  negative psych ROS   GI/Hepatic Neg liver ROS, hiatal hernia,GERD  ,,  Endo/Other  negative endocrine ROS    Renal/GU negative Renal ROS  negative genitourinary   Musculoskeletal  (+) Arthritis , Osteoarthritis,    Abdominal Normal abdominal exam  (+)   Peds  Hematology negative hematology ROS (+)   Anesthesia Other Findings   Reproductive/Obstetrics                              Anesthesia Physical Anesthesia Plan  ASA: 2  Anesthesia Plan: General   Post-op Pain Management:    Induction: Intravenous  PONV Risk Score and Plan: 4 or greater and Ondansetron , Dexamethasone  and Treatment may vary due to age or medical condition  Airway Management Planned: Mask and LMA  Additional Equipment: None  Intra-op Plan:   Post-operative Plan: Extubation in OR  Informed Consent: I have reviewed the patients History and Physical, chart, labs and discussed the procedure including the risks, benefits and alternatives for the proposed anesthesia with the patient or authorized representative who has indicated his/her understanding and acceptance.     Dental advisory given  Plan Discussed with: CRNA  Anesthesia Plan Comments:         Anesthesia Quick Evaluation

## 2024-04-06 NOTE — Op Note (Signed)
 NAME: Evalene Vath MEDICAL RECORD NO: [id number] DATE OF BIRTH: 1945-08-05 FACILITY: [facility] LOCATION: [facility] PHYSICIAN: Jovanny Stephanie R. 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  Left index finger excision mucoid cyst 2.  Left index finger debridement of DIP joint including osteophyte from middle phalanx   SURGEON:  Franky Curia, M.D.   ASSISTANT: none   ANESTHESIA:  General   INTRAVENOUS FLUIDS:  Per anesthesia flow sheet.   ESTIMATED BLOOD LOSS:  Minimal.   COMPLICATIONS:  None.   SPECIMENS: Left index finger mucoid cyst to pathology   TOURNIQUET TIME:    Total Tourniquet Time Documented: Upper Arm (Left) - 17 minutes Total: Upper Arm (Left) - 17 minutes    DISPOSITION:  Stable to PACU.   INDICATIONS: 78 year old female with mucoid cyst of left index finger with nail deformity and DIP joint arthritis.  She wished to proceed with excision mucoid cyst and debridement of DIP joint to try to prevent recurrence.  Risks, benefits and alternatives of surgery were discussed including the risks of blood loss, infection, damage to nerves, vessels, tendons, ligaments, bone for surgery, need for additional surgery, complications with wound healing, continued pain, stiffness, , recurrence.  She voiced understanding of these risks and elected to proceed.  OPERATIVE COURSE:  After being identified preoperatively by myself,  the patient and I agreed on the procedure and site of the procedure.  The surgical site was marked.  Surgical consent had been signed. Preoperative IV antibiotic prophylaxis was given. She was transferred to the operating room and placed on the operating table in supine position with the left upper extremity on an arm board.  General anesthesia was induced by the  anesthesiologist.  Left upper extremity was prepped and draped in normal sterile orthopedic fashion.  A surgical pause was performed between the surgeons, anesthesia, and operating room staff and all were in agreement as to the patient, procedure, and site of procedure.  Tourniquet at the proximal aspect of the extremity was inflated to 250 mmHg after exsanguination of the arm with an Esmarch bandage.  A hockey-stick shaped incision was made over the DIP joint at the dorsum of the index finger.  This was carried into subcutaneous tissues by spreading technique.  The area of the cyst and the distal phalanx was freed up in the synovectomy rongeurs used to remove the cyst.  There was some cyst found adjacent to the extensor tendon on the radial side.  This was removed as well.  It was sent to pathology for examination.  The DIP joint was entered underneath the extensor tendon.  There was large osteophyte on the dorsum of the middle phalanx.  This was removed with a synovectomy rongeurs.  The house curette was used to smooth out the bone and ensure no remaining dorsal osteophyte was present.  Prominent bone at the dorsal radial aspect of the middle phalanx condyle was taken down as well.  The wound and joint were copiously irrigated with sterile saline.  The wound was closed with 4-0 nylon in a horizontal mattress fashion.  A digital block was performed with quarter percent plain Marcaine  to aid in postoperative analgesia.  Wound was dressed with sterile Xeroform 4 x 4 and wrapped with a Coban dressing lightly.  AlumaFoam splint was placed  and wrapped lightly with Coban dressing.  The tourniquet was deflated at 17 minutes.  Fingertips were pink with brisk capillary refill after deflation of tourniquet.  The operative  drapes were broken down.  The patient was awoken from anesthesia safely.  She was transferred back to the stretcher and taken to PACU in stable condition.  I will see her back in the office in 1 week for  postoperative followup.  I will give her a prescription for Norco 5/325 1 tab PO q6 hours prn pain, dispense #15.   Maclin Guerrette, MD Electronically signed, 04/06/24

## 2024-04-06 NOTE — Transfer of Care (Signed)
 Immediate Anesthesia Transfer of Care Note  Patient: Beverly [name]  Procedure(s) Performed: LEFT INDEX FINGER EXCISION MUCOID CYST (Left: Index Finger) DEBRIDEMENT DISTAL INTERPHALANGEAL JOINT (Left: Index Finger)  Patient Location: PACU  Anesthesia Type:General  Level of Consciousness: awake, alert , and patient cooperative  Airway & Oxygen Therapy: Patient Spontanous Breathing and Patient connected to nasal cannula oxygen  Post-op Assessment: Report given to RN and Post -op Vital signs reviewed and stable  Post vital signs: Reviewed and stable  Last Vitals:  Vitals Value Taken Time  BP 182/92 04/06/24 16:16  Temp    Pulse 73 04/06/24 16:18  Resp 15 04/06/24 16:18  SpO2 98 % 04/06/24 16:18  Vitals shown include unfiled device data.  Last Pain:  Vitals:   04/06/24 1343  TempSrc: Temporal  PainSc: 0-No pain       BP treated by MDA in PACU  Complications: No notable events documented.

## 2024-04-06 NOTE — H&P (Signed)
 Beverly [name] is an 78 y.o. female.   Chief Complaint: mucoid cyst HPI: 78 yo female with left index finger mucoid cyst and dip arthritis.  It is bothersome to her.  She wishes to have the cyst removed and the dip joint debrided to try to prevent recurrence.  Allergies:  Allergies  Allergen Reactions   Macrodantin Hives and Rash    Other Reaction(s): hives, Other (See Comments), Unknown   Rosuvastatin Other (See Comments)    Other Reaction(s): myalgia/arthralgia, Myalgias   Wound Dressings Itching   Sulfa Antibiotics Palpitations, Swelling and Anxiety    Other Reaction(s): heart races, itchy    Past Medical History:  Diagnosis Date   Atypical mole 11/03/2013   R prox post lat thigh    Basal cell carcinoma    mid back   Basal cell carcinoma 04/01/2019   L chest parasternal    Basal cell carcinoma 09/11/2017   L mid back at braline 3.0 cm lat to spine   Emphysema lung (HCC)    Mild no symptoms Found on coronary CT scan   Esophageal dysmotility    GERD (gastroesophageal reflux disease)    occassional   History of hiatal hernia    OA (osteoarthritis)    hips and knees   Pneumonia    history of    PONV (postoperative nausea and vomiting)    Squamous cell carcinoma of skin 06/11/2021   L mid pretibia, [facility]    Past Surgical History:  Procedure Laterality Date   COLONOSCOPY     FRACTURE SURGERY  2009   left ankle   INGUINAL HERNIA REPAIR Right 08/22/2021   Procedure: OPEN RIGHT INGUINAL HERNIA REPAIR WITH MESH;  Surgeon: Signe Mitzie LABOR, MD;  Location: [facility];  Service: General;  Laterality: Right;   LAPAROSCOPY  1992   infection after tubal   PATELLA-FEMORAL ARTHROPLASTY Right 10/07/2023   Procedure: RIGHT KNEE PATELLOFEMORAL ARTHROPLASTY;  Surgeon: Addie Cordella Hamilton, MD;  Location: [facility] OR;  Service: Orthopedics;  Laterality: Right;   SHOULDER OPEN ROTATOR CUFF REPAIR Right 2009   TOTAL HIP ARTHROPLASTY Left 01/29/2019   Procedure: LEFT TOTAL HIP ARTHROPLASTY ANTERIOR  APPROACH;  Surgeon: Vernetta Lonni GRADE, MD;  Location: [facility];  Service: Orthopedics;  Laterality: Left;   TOTAL HIP ARTHROPLASTY Right 09/24/2019   Procedure: RIGHT TOTAL HIP ARTHROPLASTY ANTERIOR APPROACH;  Surgeon: Vernetta Lonni GRADE, MD;  Location: [facility];  Service: Orthopedics;  Laterality: Right;   TUBAL LIGATION  1992    Family History: Family History  Problem Relation Age of Onset   Allergic rhinitis Neg Hx    Angioedema Neg Hx    Asthma Neg Hx    Atopy Neg Hx    Eczema Neg Hx    Immunodeficiency Neg Hx    Urticaria Neg Hx     Social History:   reports that she quit smoking about 44 years ago. Her smoking use included cigarettes. She has never used smokeless tobacco. She reports current alcohol use of about 1.0 standard drink of alcohol per week. She reports current drug use. Drug: Other-see comments.  Medications: Medications Prior to Admission  Medication Sig Dispense Refill   bimatoprost  (LATISSE ) 0.03 % ophthalmic solution PLACE ONE DROP ON APPLICATOR AND APPLY EVENLY ALONG THE SKIN OF THE UPPER EYELID AT BASE OF EYELASHES ONCE DAILY AT BEDTIME TO BOTH EYES (Patient taking differently: Place 1 Application into both eyes 2 (two) times a week.) 3 mL 4   Calcium Carb-Cholecalciferol (CALCIUM 600 + D  PO) Take 1 tablet by mouth daily in the afternoon.     CRANBERRY PO Take 1 capsule by mouth daily in the afternoon.     estradiol (ESTRACE) 0.01 % CREA vaginal cream Place 1 Applicatorful vaginally 2 (two) times a week.     ibuprofen (ADVIL) 200 MG tablet Take 400 mg by mouth every 8 (eight) hours as needed (pain.).     Misc Natural Products (BEET ROOT PO) Take 1 capsule by mouth daily in the afternoon.     Multiple Vitamins-Minerals (MEGAVITE FRUITS & VEGGIES PO) Take 2 capsules by mouth daily in the afternoon. (1) Fruit + (1) Veggie     Multiple Vitamins-Minerals (MULTIVITAMIN WITH MINERALS) tablet Take 1 tablet by mouth daily in the afternoon.     Oil of Oregano 1500  MG CAPS Take 1,500 mg by mouth daily in the afternoon.     OVER THE COUNTER MEDICATION Take 1 each by mouth at bedtime. Delta 9 THC Gummy for Sleep     pantoprazole  (PROTONIX ) 40 MG tablet TAKE 1 TABLET BY MOUTH TWICE A DAY (Patient taking differently: Take 40 mg by mouth every evening.) 180 tablet 1   pravastatin (PRAVACHOL) 10 MG tablet Take 10 mg by mouth 2 (two) times a week. Twice weekly - Wednesday & Friday     Probiotic Product (PROBIOTIC PO) Take 1 capsule by mouth daily in the afternoon.     RHUBARB PO Take 7 capsules by mouth every evening. Turkey Rhubarb     TURMERIC EXTRA STRENGTH PO Take 1 capsule by mouth daily in the afternoon.     vitamin B-12 (CYANOCOBALAMIN ) 1000 MCG tablet Take 1,000 mcg by mouth daily in the afternoon.     amoxicillin  (AMOXIL ) 500 MG tablet 2 grams 1 hour prior to dental procedure 10 tablet 0   mupirocin  ointment (BACTROBAN ) 2 % Apply 1 Application topically daily. Apply to affected area and cover with bandage until healed. 30 g 11    No results found for this or any previous visit (from the past 48 hours).  No results found.    Blood pressure (!) 157/83, pulse 72, temperature 98.2 F (36.8 C), temperature source Temporal, resp. rate 15, height 5' 6.5 (1.689 m), weight 61.3 kg, SpO2 100%.  General appearance: alert, cooperative, and appears stated age Head: Normocephalic, without obvious abnormality, atraumatic Neck: supple, symmetrical, trachea midline Extremities: Intact sensation and capillary refill all digits.  +epl/fpl/io.  No wounds.  Skin: Skin color, texture, turgor normal. 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  Non operative and operative treatment options have been discussed with the patient and patient wishes to proceed with operative treatment. Risks, benefits, and alternatives of surgery have been discussed and the patient agrees with the  plan of care.   Anthonyjames Bargar 04/06/2024, 3:09 PM

## 2024-04-06 NOTE — Discharge Instructions (Addendum)
 [facility] Instructions Hand Surgery  Wound Care: Keep your hand elevated above the level of your heart.  Do not allow it to dangle by your side.  Keep the dressing dry and do not remove it unless your doctor advises you to do so.  He will usually change it at the time of your post-op visit.  Moving your fingers is advised to stimulate circulation but will depend on the site of your surgery.  If you have a splint applied, your doctor will advise you regarding movement.  Activity: Do not drive or operate machinery today.  Rest today and then you may return to your normal activity and work as indicated by your physician.  Diet:  Drink liquids today or eat a light diet.  You may resume a regular diet tomorrow.    General expectations: Pain for two to three days. Fingers may become slightly swollen.  Call your doctor if any of the following occur: Severe pain not relieved by pain medication. Elevated temperature. Dressing soaked with blood. Inability to move fingers. White or bluish color to fingers.   No Ibuprofen until after 10:00pm tonight.   Post Anesthesia Home Care Instructions  Activity: Get plenty of rest for the remainder of the day. A responsible individual must stay with you for 24 hours following the procedure.  For the next 24 hours, DO NOT: -Drive a car -Advertising copywriter -Drink alcoholic beverages -Take any medication unless instructed by your physician -Make any legal decisions or sign important papers.  Meals: Start with liquid foods such as gelatin or soup. Progress to regular foods as tolerated. Avoid greasy, spicy, heavy foods. If nausea and/or vomiting occur, drink only clear liquids until the nausea and/or vomiting subsides. Call your physician if vomiting continues.  Special Instructions/Symptoms: Your throat may feel dry or sore from the anesthesia or the breathing tube placed in your throat during surgery. If this causes discomfort, gargle with warm salt  water . The discomfort should disappear within 24 hours.  If you had a scopolamine patch placed behind your ear for the management of post- operative nausea and/or vomiting:  1. The medication in the patch is effective for 72 hours, after which it should be removed.  Wrap patch in a tissue and discard in the trash. Wash hands thoroughly with soap and water . 2. You may remove the patch earlier than 72 hours if you experience unpleasant side effects which may include dry mouth, dizziness or visual disturbances. 3. Avoid touching the patch. Wash your hands with soap and water  after contact with the patch.

## 2024-04-06 NOTE — Anesthesia Procedure Notes (Signed)
 Procedure Name: LMA Insertion Date/Time: 04/06/2024 3:23 PM  Performed by: Donnell Berwyn SQUIBB, CRNAPre-anesthesia Checklist: Patient identified, Emergency Drugs available, Suction available, Patient being monitored and Timeout performed Patient Re-evaluated:Patient Re-evaluated prior to induction Oxygen Delivery Method: Circle system utilized Preoxygenation: Pre-oxygenation with 100% oxygen Induction Type: IV induction Ventilation: Mask ventilation without difficulty LMA: LMA inserted LMA Size: 4.0 Number of attempts: 1 Placement Confirmation: positive ETCO2 and breath sounds checked- equal and bilateral Tube secured with: Tape Dental Injury: Teeth and Oropharynx as per pre-operative assessment

## 2024-04-07 ENCOUNTER — Encounter (HOSPITAL_BASED_OUTPATIENT_CLINIC_OR_DEPARTMENT_OTHER): Payer: Self-pay | Admitting: Orthopedic Surgery

## 2024-04-07 ENCOUNTER — Encounter: Admitting: Orthopedic Surgery

## 2024-04-07 NOTE — Anesthesia Postprocedure Evaluation (Signed)
 Anesthesia Post Note  Patient: Beverly [name]  Procedure(s) Performed: LEFT INDEX FINGER EXCISION MUCOID CYST (Left: Index Finger) DEBRIDEMENT DISTAL INTERPHALANGEAL JOINT (Left: Index Finger)     Patient location during evaluation: PACU Anesthesia Type: General Level of consciousness: awake and alert Pain management: pain level controlled Vital Signs Assessment: post-procedure vital signs reviewed and stable Respiratory status: spontaneous breathing, nonlabored ventilation, respiratory function stable and patient connected to nasal cannula oxygen Cardiovascular status: blood pressure returned to baseline and stable Postop Assessment: no apparent nausea or vomiting Anesthetic complications: no   No notable events documented.  Last Vitals:  Vitals:   04/06/24 1658 04/06/24 1700  BP: 109/77 (!) 162/96  Pulse: 71   Resp: 16   Temp: (!) 36.4 C   SpO2: 98%     Last Pain:  Vitals:   04/06/24 1658  TempSrc:   PainSc: 0-No pain                 Cordella P [name]

## 2024-04-08 LAB — SURGICAL PATHOLOGY

## 2024-04-13 DIAGNOSIS — M6740 Ganglion, unspecified site: Secondary | ICD-10-CM | POA: Diagnosis not present

## 2024-04-13 DIAGNOSIS — M19042 Primary osteoarthritis, left hand: Secondary | ICD-10-CM | POA: Diagnosis not present

## 2024-04-14 DIAGNOSIS — M5011 Cervical disc disorder with radiculopathy,  high cervical region: Secondary | ICD-10-CM | POA: Diagnosis not present

## 2024-04-14 DIAGNOSIS — M9901 Segmental and somatic dysfunction of cervical region: Secondary | ICD-10-CM | POA: Diagnosis not present

## 2024-04-20 DIAGNOSIS — M19042 Primary osteoarthritis, left hand: Secondary | ICD-10-CM | POA: Diagnosis not present

## 2024-04-20 DIAGNOSIS — M67442 Ganglion, left hand: Secondary | ICD-10-CM | POA: Diagnosis not present

## 2024-05-05 DIAGNOSIS — M9901 Segmental and somatic dysfunction of cervical region: Secondary | ICD-10-CM | POA: Diagnosis not present

## 2024-05-05 DIAGNOSIS — M5011 Cervical disc disorder with radiculopathy,  high cervical region: Secondary | ICD-10-CM | POA: Diagnosis not present

## 2024-05-18 DIAGNOSIS — M19042 Primary osteoarthritis, left hand: Secondary | ICD-10-CM | POA: Diagnosis not present

## 2024-05-18 DIAGNOSIS — M6740 Ganglion, unspecified site: Secondary | ICD-10-CM | POA: Diagnosis not present

## 2024-06-23 ENCOUNTER — Other Ambulatory Visit: Payer: Self-pay | Admitting: Orthopedic Surgery

## 2024-07-05 ENCOUNTER — Ambulatory Visit: Admitting: Dermatology

## 2024-07-07 ENCOUNTER — Other Ambulatory Visit: Payer: Self-pay

## 2024-07-07 MED ORDER — BIMATOPROST 0.03 % EX SOLN: 1.0000 | CUTANEOUS | 0 refills | Status: AC

## 2024-07-07 NOTE — Progress Notes (Signed)
 Patient asked for prescription to be transferred due to moving to [place] . 1 refill sent in to help her until she finds new dermatologist. aw

## 2024-11-16 ENCOUNTER — Ambulatory Visit: Admitting: Dermatology
